# Patient Record
Sex: Female | Born: 1960 | Race: White | Hispanic: No | Marital: Single | Smoking: Never smoker
Health system: Southern US, Community
[De-identification: ages and names within clinical notes are randomized; demographics above are authoritative.]

## PROBLEM LIST (undated history)

## (undated) DIAGNOSIS — E039 Hypothyroidism, unspecified: Secondary | ICD-10-CM

## (undated) DIAGNOSIS — K219 Gastro-esophageal reflux disease without esophagitis: Secondary | ICD-10-CM

## (undated) DIAGNOSIS — M1990 Unspecified osteoarthritis, unspecified site: Secondary | ICD-10-CM

## (undated) DIAGNOSIS — I10 Essential (primary) hypertension: Secondary | ICD-10-CM

## (undated) DIAGNOSIS — F419 Anxiety disorder, unspecified: Secondary | ICD-10-CM

## (undated) HISTORY — DX: Anxiety disorder, unspecified: F41.9

## (undated) HISTORY — PX: TONSILLECTOMY: SUR1361

## (undated) HISTORY — DX: Essential (primary) hypertension: I10

## (undated) HISTORY — DX: Hypothyroidism, unspecified: E03.9

---

## 1998-07-28 ENCOUNTER — Other Ambulatory Visit: Admission: RE | Admit: 1998-07-28 | Discharge: 1998-07-28 | Payer: Self-pay | Admitting: *Deleted

## 2001-01-19 ENCOUNTER — Other Ambulatory Visit: Admission: RE | Admit: 2001-01-19 | Discharge: 2001-01-19 | Payer: Self-pay | Admitting: *Deleted

## 2002-09-11 ENCOUNTER — Emergency Department (HOSPITAL_COMMUNITY): Admission: EM | Admit: 2002-09-11 | Discharge: 2002-09-11 | Payer: Self-pay | Admitting: Emergency Medicine

## 2002-09-11 ENCOUNTER — Encounter: Payer: Self-pay | Admitting: Emergency Medicine

## 2002-09-12 ENCOUNTER — Emergency Department (HOSPITAL_COMMUNITY): Admission: EM | Admit: 2002-09-12 | Discharge: 2002-09-12 | Payer: Self-pay | Admitting: Emergency Medicine

## 2004-05-21 ENCOUNTER — Emergency Department (HOSPITAL_COMMUNITY): Admission: EM | Admit: 2004-05-21 | Discharge: 2004-05-21 | Payer: Self-pay | Admitting: Family Medicine

## 2004-07-04 ENCOUNTER — Emergency Department (HOSPITAL_COMMUNITY): Admission: EM | Admit: 2004-07-04 | Discharge: 2004-07-04 | Payer: Self-pay | Admitting: Family Medicine

## 2005-10-23 ENCOUNTER — Encounter: Admission: RE | Admit: 2005-10-23 | Discharge: 2005-10-23 | Payer: Self-pay | Admitting: Family Medicine

## 2005-12-06 ENCOUNTER — Other Ambulatory Visit: Admission: RE | Admit: 2005-12-06 | Discharge: 2005-12-06 | Payer: Self-pay | Admitting: Obstetrics & Gynecology

## 2007-04-28 ENCOUNTER — Other Ambulatory Visit: Admission: RE | Admit: 2007-04-28 | Discharge: 2007-04-28 | Payer: Self-pay | Admitting: Obstetrics & Gynecology

## 2010-06-24 ENCOUNTER — Encounter: Payer: Self-pay | Admitting: Family Medicine

## 2011-01-22 ENCOUNTER — Emergency Department (HOSPITAL_COMMUNITY)
Admission: EM | Admit: 2011-01-22 | Discharge: 2011-01-22 | Disposition: A | Discharge status: Home / Self Care | Payer: 59 | Attending: Emergency Medicine | Admitting: Emergency Medicine

## 2011-01-22 DIAGNOSIS — R42 Dizziness and giddiness: Secondary | ICD-10-CM | POA: Insufficient documentation

## 2011-01-22 DIAGNOSIS — I10 Essential (primary) hypertension: Secondary | ICD-10-CM | POA: Insufficient documentation

## 2011-01-22 DIAGNOSIS — K219 Gastro-esophageal reflux disease without esophagitis: Secondary | ICD-10-CM | POA: Insufficient documentation

## 2011-01-22 DIAGNOSIS — R51 Headache: Secondary | ICD-10-CM | POA: Insufficient documentation

## 2011-01-22 LAB — POCT I-STAT, CHEM 8
Calcium, Ion: 1.25 mmol/L (ref 1.12–1.32)
Creatinine, Ser: 0.7 mg/dL (ref 0.50–1.10)
Glucose, Bld: 96 mg/dL (ref 70–99)
HCT: 41 % (ref 36.0–46.0)
Hemoglobin: 13.9 g/dL (ref 12.0–15.0)
Potassium: 3.7 mEq/L (ref 3.5–5.1)
TCO2: 28 mmol/L (ref 0–100)

## 2011-01-28 ENCOUNTER — Other Ambulatory Visit: Payer: Self-pay | Admitting: Internal Medicine

## 2011-01-28 DIAGNOSIS — R110 Nausea: Secondary | ICD-10-CM

## 2011-01-29 ENCOUNTER — Ambulatory Visit
Admission: RE | Admit: 2011-01-29 | Discharge: 2011-01-29 | Disposition: A | Discharge status: Home / Self Care | Payer: 59 | Source: Ambulatory Visit | Attending: Internal Medicine | Admitting: Internal Medicine

## 2011-01-29 DIAGNOSIS — R110 Nausea: Secondary | ICD-10-CM

## 2011-08-14 ENCOUNTER — Other Ambulatory Visit: Payer: Self-pay | Admitting: Internal Medicine

## 2011-08-14 DIAGNOSIS — D1803 Hemangioma of intra-abdominal structures: Secondary | ICD-10-CM

## 2011-09-05 ENCOUNTER — Other Ambulatory Visit: Payer: 59

## 2013-02-22 ENCOUNTER — Encounter: Payer: Self-pay | Admitting: Internal Medicine

## 2013-08-25 ENCOUNTER — Encounter: Payer: Self-pay | Admitting: Gynecology

## 2013-08-25 ENCOUNTER — Ambulatory Visit (INDEPENDENT_AMBULATORY_CARE_PROVIDER_SITE_OTHER): Payer: 59 | Admitting: Gynecology

## 2013-08-25 VITALS — BP 120/80 | HR 70 | Resp 16 | Ht 62.25 in | Wt 150.0 lb

## 2013-08-25 DIAGNOSIS — Z0000 Encounter for general adult medical examination without abnormal findings: Secondary | ICD-10-CM

## 2013-08-25 DIAGNOSIS — F419 Anxiety disorder, unspecified: Secondary | ICD-10-CM | POA: Insufficient documentation

## 2013-08-25 DIAGNOSIS — E039 Hypothyroidism, unspecified: Secondary | ICD-10-CM | POA: Insufficient documentation

## 2013-08-25 DIAGNOSIS — Z01419 Encounter for gynecological examination (general) (routine) without abnormal findings: Secondary | ICD-10-CM

## 2013-08-25 DIAGNOSIS — Z124 Encounter for screening for malignant neoplasm of cervix: Secondary | ICD-10-CM

## 2013-08-25 DIAGNOSIS — I10 Essential (primary) hypertension: Secondary | ICD-10-CM | POA: Insufficient documentation

## 2013-08-25 LAB — POCT URINE PREGNANCY: Preg Test, Ur: NEGATIVE

## 2013-08-25 NOTE — Progress Notes (Signed)
53 y.o. Single Caucasian female  G0 here for annual exam. Pt reports menses are absent. regular.  She does report hot flashes, does have night sweats, does not have vaginal dryness.  She is not using lubricants.  Pt reports cycles were heavy and regular and dysmenorrhea, but had not seen Gyn for over 8y.  No treatment for hot flashes or night sweats.  Not sexually active for several years.  Patient's last menstrual period was 10/30/2012.          Sexually active: no  The current method of family planning is post menopausal status.    Exercising: yes  yoga 2x/wk ; walking the dogs qd, mow the grass q0d Last pap: 2008 WNL Abnormal PAP: no Mammogram: 10/31/2005  BSE: yes  Colonoscopy: 07/2013 Normal  F/u in 5 years  DEXA: 1998 Normal   Alcohol: 7 drinks/wk Tobacco: no  Labs: Thressa Sheller, MD   No health maintenance topics applied.  Family History  Problem Relation Age of Onset  . Uterine cancer Maternal Grandmother   . Colon cancer      Paternal Event organiser  . Breast cancer      Maternal Great Grandmother  . Hypertension Father   . Hypertension Brother   . Heart attack Father   . Lupus Mother   . Osteoporosis Mother     Patient Active Problem List   Diagnosis Date Noted  . Hypothyroid   . Hypertension   . Anxiety     Past Medical History  Diagnosis Date  . Anxiety   . Hypertension   . Hypothyroid     Past Surgical History  Procedure Laterality Date  . Tonsillectomy  3rd grade    Allergies: Review of patient's allergies indicates no known allergies.  Current Outpatient Prescriptions  Medication Sig Dispense Refill  . esomeprazole (NEXIUM) 40 MG capsule Take 40 mg by mouth daily at 12 noon.      Marland Kitchen FLUoxetine (PROZAC) 40 MG capsule Take 40 mg by mouth daily.      . Levothyroxine Sodium (SYNTHROID PO) Take by mouth.      . losartan (COZAAR) 25 MG tablet Take 25 mg by mouth daily.       No current facility-administered medications for this visit.    ROS:  Pertinent items are noted in HPI.  Exam:    BP 120/80  Pulse 70  Resp 16  Ht 5' 2.25" (1.581 m)  Wt 150 lb (68.04 kg)  BMI 27.22 kg/m2  LMP 10/30/2012 Weight change: @WEIGHTCHANGE @ Last 3 height recordings:  Ht Readings from Last 3 Encounters:  08/25/13 5' 2.25" (1.581 m)   General appearance: alert, cooperative and appears stated age Head: Normocephalic, without obvious abnormality, atraumatic Neck: no adenopathy, no carotid bruit, no JVD, supple, symmetrical, trachea midline and thyroid not enlarged, symmetric, no tenderness/mass/nodules Lungs: clear to auscultation bilaterally Breasts: normal appearance, no masses or tenderness Heart: regular rate and rhythm, S1, S2 normal, no murmur, click, rub or gallop Abdomen: soft, non-tender; bowel sounds normal; no masses,  no organomegaly Extremities: extremities normal, atraumatic, no cyanosis or edema Skin: Skin color, texture, turgor normal. No rashes or lesions Lymph nodes: Cervical, supraclavicular, and axillary nodes normal. no inguinal nodes palpated Neurologic: Grossly normal   Pelvic: External genitalia:  no lesions              Urethra: normal appearing urethra with no masses, tenderness or lesions              Bartholins  and Skenes: normal                 Vagina: normal appearing vagina with normal color and discharge, no lesions              Cervix: normal appearance              Pap taken: yes        Bimanual Exam:  Uterus:  uterus is normal size, shape, consistency and nontender                                      Adnexa:    normal adnexa in size, nontender and no masses                                      Rectovaginal: Confirms                                      Anus:  normal sphincter tone, no lesions  A: well woman Perimenopausal with symptoms     P: mammogram pap smear with HRHPV, guidelines reviewed Informed to call with bleeding once no cycle for over 1y Discussed treatment options for menopausal  symptoms: HRT, effexor, behavioral modifications. Pt has been on prozac since father died in 13-Sep-1994 and is hesitant to change, had tried effexor in past counseled on breast self exam, mammography screening, menopause, adequate intake of calcium and vitamin D, diet and exercise return annually or prn Discussed PAP guideline changes, importance of weight bearing exercises, calcium, vit D and balanced diet.  An After Visit Summary was printed and given to the patient.

## 2013-08-25 NOTE — Patient Instructions (Addendum)

## 2013-08-30 LAB — IPS PAP TEST WITH HPV

## 2013-08-31 ENCOUNTER — Telehealth: Payer: Self-pay | Admitting: *Deleted

## 2013-08-31 MED ORDER — TINIDAZOLE 500 MG PO TABS: ORAL_TABLET | ORAL | Status: DC

## 2013-08-31 NOTE — Telephone Encounter (Signed)
Message copied by Alfonzo Feller on Tue Aug 31, 2013  1:26 PM ------      Message from: Elveria Rising      Created: Mon Aug 30, 2013  4:56 PM       Inform BV on PAP, can treat if desires, either metrogel at hs for 5 nights or tindamax 500mg x4 repeat #8, recall 2 ------

## 2013-08-31 NOTE — Telephone Encounter (Signed)
Patient is calling jasmine back °

## 2013-08-31 NOTE — Telephone Encounter (Signed)
Patient notified would like to treat BV with Tindamax 500 mg daily x 4 days and then repeat.  Tindamax 500 mg #8/0 refills sent in to [facility] , patient is aware.

## 2013-08-31 NOTE — Telephone Encounter (Signed)
Message copied by Alfonzo Feller on Tue Aug 31, 2013  9:07 AM ------      Message from: Elveria Rising      Created: Mon Aug 30, 2013  4:56 PM       Inform BV on PAP, can treat if desires, either metrogel at hs for 5 nights or tindamax 500mg x4 repeat #8, recall 2 ------

## 2013-08-31 NOTE — Telephone Encounter (Signed)
Left Message To Call Back  

## 2014-05-03 ENCOUNTER — Telehealth: Payer: Self-pay

## 2014-05-03 NOTE — Telephone Encounter (Signed)
AEX has been schedule with Ms. Sue [name] on 09/02/14

## 2014-06-24 ENCOUNTER — Institutional Professional Consult (permissible substitution): Payer: 59 | Admitting: Sports Medicine

## 2014-06-30 ENCOUNTER — Ambulatory Visit (INDEPENDENT_AMBULATORY_CARE_PROVIDER_SITE_OTHER): Payer: 59 | Admitting: Sports Medicine

## 2014-06-30 ENCOUNTER — Encounter: Payer: Self-pay | Admitting: Sports Medicine

## 2014-06-30 VITALS — BP 141/96 | HR 88 | Ht 63.0 in | Wt 154.0 lb

## 2014-06-30 DIAGNOSIS — L723 Sebaceous cyst: Secondary | ICD-10-CM | POA: Insufficient documentation

## 2014-06-30 MED ORDER — HYDROCODONE-ACETAMINOPHEN 5-325 MG PO TABS: 1.0000 | ORAL_TABLET | Freq: Three times a day (TID) | ORAL | Status: DC | PRN

## 2014-06-30 NOTE — Assessment & Plan Note (Signed)
Surgical excision of cyst and capsule today. Return next week for a wound check. Hydrocodone for pain.

## 2014-06-30 NOTE — Progress Notes (Signed)
   Subjective:    I'm seeing this patient as a consultation for:  Dr. Thressa Sheller  CC: Skin mass  HPI: This is a very pleasant 54 year old female, for sometime now she's had a mass on the midline of her back between the shoulder blades, it is occasionally painful, very irritating, and occasionally drains. She was referred to me for further evaluation and possible surgical treatment. Symptoms are moderate, persistent. Pain does not radiate.  Past medical history, Surgical history, Family history not pertinant except as noted below, Social history, Allergies, and medications have been entered into the medical record, reviewed, and no changes needed.   Review of Systems: No headache, visual changes, nausea, vomiting, diarrhea, constipation, dizziness, abdominal pain, skin rash, fevers, chills, night sweats, weight loss, swollen lymph nodes, body aches, joint swelling, muscle aches, chest pain, shortness of breath, mood changes, visual or auditory hallucinations.   Objective:   General: Well Developed, well nourished, and in no acute distress.  Neuro/Psych: Alert and oriented x3, extra-ocular muscles intact, able to move all 4 extremities, sensation grossly intact. Skin: Warm and dry, no rashes noted. There is a 2.5 cm well-defined, movable mass in the midline between her shoulder blades, there is an additional mass approximately 1.5 cm somewhat cranial. Respiratory: Not using accessory muscles, speaking in full sentences, trachea midline.  Cardiovascular: Pulses palpable, no extremity edema. Abdomen: Does not appear distended.  Procedure:  Excision of 2.5cm midline back sebaceous cyst Risks, benefits, and alternatives explained and consent obtained. Time out conducted. Surface prepped with alcohol. 5cc lidocaine with epinephine infiltrated in a field block. Adequate anesthesia ensured. Area prepped and draped in a sterile fashion. Using a #15 blade I made a longitudinal incision over  the mass, using both sharp and blunt dissection I carried the dissection around each side of the mass which clearly appeared to be a sebaceous cyst. The cyst was very adherent to the surrounding subcutaneous tissues. At one point I did have to empty the cyst contents, I was then able to remove the cyst capsule en-bloc. I then placed a single 4-0 Prolene horizontal mattress suture approximating the edges well. Hemostasis achieved. Estimated blood loss is minimal. Pt stable.  Impression and Recommendations:   This case required medical decision making of moderate complexity.

## 2014-06-30 NOTE — Patient Instructions (Signed)

## 2014-07-08 ENCOUNTER — Ambulatory Visit (INDEPENDENT_AMBULATORY_CARE_PROVIDER_SITE_OTHER): Payer: 59 | Admitting: Sports Medicine

## 2014-07-08 ENCOUNTER — Encounter: Payer: Self-pay | Admitting: Sports Medicine

## 2014-07-08 DIAGNOSIS — L723 Sebaceous cyst: Secondary | ICD-10-CM

## 2014-07-08 MED ORDER — DOXYCYCLINE HYCLATE 100 MG PO TABS: 100.0000 mg | ORAL_TABLET | Freq: Two times a day (BID) | ORAL | Status: DC

## 2014-07-08 NOTE — Progress Notes (Signed)
  Subjective: 1 week post surgical excision of a sebaceous cyst on the back, doing well. There is a second sebaceous cyst somewhat above that has become painful.  Objective: General: Well-developed, well-nourished, and in no acute distress. Back: Incision is clean, dry, intact, a single horizontal mattress Prolene suture was removed, wound looks good and was dressed. There is a single sebaceous cyst just superior, it is bigger than prior. Also slightly tender to palpation.  Assessment/plan:

## 2014-07-08 NOTE — Assessment & Plan Note (Signed)
Doing well now post surgical excision, a single horizontal mattress suture was removed. It looks like the single sebaceous cyst above has become infected, we are going to do a course of doxycycline for 7 days, I can see her back in about 2 weeks, or whenever she desires for excision of the second sebaceous cyst.

## 2014-07-15 ENCOUNTER — Encounter: Payer: Self-pay | Admitting: Sports Medicine

## 2014-07-15 ENCOUNTER — Ambulatory Visit (INDEPENDENT_AMBULATORY_CARE_PROVIDER_SITE_OTHER): Payer: 59 | Admitting: Sports Medicine

## 2014-07-15 DIAGNOSIS — L723 Sebaceous cyst: Secondary | ICD-10-CM

## 2014-07-15 MED ORDER — HYDROCODONE-ACETAMINOPHEN 10-325 MG PO TABS: 1.0000 | ORAL_TABLET | Freq: Three times a day (TID) | ORAL | Status: DC | PRN

## 2014-07-15 NOTE — Assessment & Plan Note (Signed)
Removal of sebaceous cyst #2. Return in 7 days for suture removal.

## 2014-07-15 NOTE — Progress Notes (Signed)
   Procedure:  Excision of 2.5cm midline back sebaceous cyst Risks, benefits, and alternatives explained and consent obtained. Time out conducted. Surface prepped with alcohol. 5cc lidocaine with epinephine infiltrated in a field block. Adequate anesthesia ensured. Area prepped and draped in a sterile fashion. Using a #15 blade I made a longitudinal incision over the mass, using both sharp and blunt dissection I carried the dissection around each side of the mass which clearly appeared to be a sebaceous cyst. The cyst was very adherent to the surrounding subcutaneous tissues. At one point I did have to empty the cyst contents, I was then able to remove the cyst capsule en-bloc. I then placed a single 4-0 Prolene horizontal mattress suture approximating the edges well. Hemostasis achieved. Estimated blood loss is minimal. Pt stable.

## 2014-07-22 ENCOUNTER — Ambulatory Visit (INDEPENDENT_AMBULATORY_CARE_PROVIDER_SITE_OTHER): Payer: 59 | Admitting: Sports Medicine

## 2014-07-22 ENCOUNTER — Encounter: Payer: Self-pay | Admitting: Sports Medicine

## 2014-07-22 ENCOUNTER — Ambulatory Visit (INDEPENDENT_AMBULATORY_CARE_PROVIDER_SITE_OTHER): Payer: 59

## 2014-07-22 VITALS — BP 149/96 | HR 75 | Wt 153.0 lb

## 2014-07-22 DIAGNOSIS — J209 Acute bronchitis, unspecified: Secondary | ICD-10-CM

## 2014-07-22 DIAGNOSIS — L723 Sebaceous cyst: Secondary | ICD-10-CM

## 2014-07-22 DIAGNOSIS — R0989 Other specified symptoms and signs involving the circulatory and respiratory systems: Secondary | ICD-10-CM

## 2014-07-22 DIAGNOSIS — R05 Cough: Secondary | ICD-10-CM

## 2014-07-22 MED ORDER — AZITHROMYCIN 250 MG PO TABS: ORAL_TABLET | ORAL | Status: DC

## 2014-07-22 MED ORDER — PREDNISONE 50 MG PO TABS: 50.0000 mg | ORAL_TABLET | Freq: Every day | ORAL | Status: DC

## 2014-07-22 MED ORDER — BENZONATATE 200 MG PO CAPS: 200.0000 mg | ORAL_CAPSULE | Freq: Three times a day (TID) | ORAL | Status: DC | PRN

## 2014-07-22 NOTE — Assessment & Plan Note (Signed)
Doing extremely well post excision, sutures removed today, return as needed for this.

## 2014-07-22 NOTE — Progress Notes (Signed)
  Subjective:    CC: suture removal, cough  HPI: Sebaceous cyst: Last week remove the sebaceous cyst, this was #2, from her back. She is here for suture removal, doing well, no pain, no local symptoms.  Cough: Present for a couple of weeks, has failed a course of doxycycline, significant wheezing, mild shortness of breath. No constitutional symptoms.  Past medical history, Surgical history, Family history not pertinant except as noted below, Social history, Allergies, and medications have been entered into the medical record, reviewed, and no changes needed.   Review of Systems: No fevers, chills, night sweats, weight loss, chest pain, or shortness of breath.   Objective:    General: Well Developed, well nourished, and in no acute distress.  Neuro: Alert and oriented x3, extra-ocular muscles intact, sensation grossly intact.  HEENT: Normocephalic, atraumatic, pupils equal round reactive to light, neck supple, no masses, no lymphadenopathy, thyroid nonpalpable.  Skin: Warm and dry, no rashes. Cardiac: Regular rate and rhythm, no murmurs rubs or gallops, no lower extremity edema.  Respiratory: diffuse expiratory wheezes with coarse sounds, and rales. Not using accessory muscles, speaking in full sentences.  Incision is clean, dry, and intact, suture removed.  Impression and Recommendations:

## 2014-07-22 NOTE — Assessment & Plan Note (Signed)
Unfortunately with persistent symptoms despite a course of doxycycline. There is significant expiratory wheezes, and coarse sounds. Azithromycin, prednisone, Tessalon Perles, chest x-ray. She can follow up with PCP regarding this in one to 2 weeks.

## 2014-07-29 ENCOUNTER — Ambulatory Visit: Payer: 59 | Admitting: Sports Medicine

## 2014-09-02 ENCOUNTER — Ambulatory Visit: Payer: Self-pay | Admitting: Certified Nurse Midwife

## 2014-09-02 ENCOUNTER — Ambulatory Visit: Payer: Self-pay | Admitting: Gynecology

## 2014-09-06 ENCOUNTER — Other Ambulatory Visit: Payer: Self-pay | Admitting: Family Medicine

## 2014-09-06 ENCOUNTER — Other Ambulatory Visit: Payer: Self-pay | Admitting: Internal Medicine

## 2014-09-06 DIAGNOSIS — N63 Unspecified lump in unspecified breast: Secondary | ICD-10-CM

## 2014-09-09 ENCOUNTER — Ambulatory Visit
Admission: RE | Admit: 2014-09-09 | Discharge: 2014-09-09 | Disposition: A | Discharge status: Home / Self Care | Payer: 59 | Source: Ambulatory Visit | Attending: Internal Medicine | Admitting: Internal Medicine

## 2014-09-09 DIAGNOSIS — N63 Unspecified lump in unspecified breast: Secondary | ICD-10-CM

## 2014-09-09 LAB — HM MAMMOGRAPHY

## 2014-11-03 ENCOUNTER — Encounter: Payer: Self-pay | Admitting: Certified Nurse Midwife

## 2014-11-03 ENCOUNTER — Ambulatory Visit (INDEPENDENT_AMBULATORY_CARE_PROVIDER_SITE_OTHER): Payer: 59 | Admitting: Certified Nurse Midwife

## 2014-11-03 VITALS — BP 110/70 | HR 68 | Resp 16 | Ht 62.5 in | Wt 153.0 lb

## 2014-11-03 DIAGNOSIS — Z01419 Encounter for gynecological examination (general) (routine) without abnormal findings: Secondary | ICD-10-CM | POA: Diagnosis not present

## 2014-11-03 NOTE — Progress Notes (Signed)
54 y.o. G0P0000 Single  Caucasian Fe here for annual exam. Menopausal no HRT. Denies vaginal bleeding or vaginal dryness. No STD screening. Sees Dr. Aline August yearly, and sees for Hypertension/hypothyroid management. Busy with her 3 rescue dogs! No health issues today.  Patient's last menstrual period was 06/03/2012.          Sexually active:No The current method of family planning is none.    Exercising: Yes.    yoga Smoker:  no  Health Maintenance: Pap:  08-25-13 neg HPV HR neg, no abnormals MMG: 09-09-14 category c density,birads 2 & rt breast u/s, 3 D Mammogram yearly Colonoscopy:  2015 neg. 5 year, family history of colon ca BMD:   1998 TDaP:  2011 Labs: none Self breast exam: done monthly   reports that she has never smoked. She does not have any smokeless tobacco history on file. She reports that she drinks about 3.5 oz of alcohol per week. She reports that she does not use illicit drugs.  Past Medical History  Diagnosis Date  . Hypertension   . Hypothyroid   . Anxiety     Past Surgical History  Procedure Laterality Date  . Tonsillectomy  3rd grade    Current Outpatient Prescriptions  Medication Sig Dispense Refill  . esomeprazole (NEXIUM) 40 MG capsule Take 40 mg by mouth daily at 12 noon.    Marland Kitchen FLUoxetine (PROZAC) 40 MG capsule Take 40 mg by mouth daily.    Marland Kitchen levothyroxine (SYNTHROID, LEVOTHROID) 100 MCG tablet Take 100 mcg by mouth daily before breakfast.    . losartan (COZAAR) 25 MG tablet Take 25 mg by mouth daily.     No current facility-administered medications for this visit.    Family History  Problem Relation Age of Onset  . Uterine cancer Maternal Grandmother   . Colon cancer      Paternal Event organiser  . Breast cancer      Maternal Great Grandmother  . Hypertension Father   . Hypertension Brother   . Heart attack Father   . Lupus Mother   . Osteoporosis Mother     ROS:  Pertinent items are noted in HPI.  Otherwise, a comprehensive ROS was  negative.  Exam:   BP 110/70 mmHg  Pulse 68  Resp 16  Ht 5' 2.5" (1.588 m)  Wt 153 lb (69.4 kg)  BMI 27.52 kg/m2  LMP 06/03/2012 Height: 5' 2.5" (158.8 cm) Ht Readings from Last 3 Encounters:  11/03/14 5' 2.5" (1.588 m)  07/08/14 5\' 3"  (1.6 m)  06/30/14 5\' 3"  (1.6 m)    General appearance: alert, cooperative and appears stated age Head: Normocephalic, without obvious abnormality, atraumatic Neck: no adenopathy, supple, symmetrical, trachea midline and thyroid normal to inspection and palpation Lungs: clear to auscultation bilaterally Breasts: normal appearance, no masses or tenderness, No nipple retraction or dimpling, No nipple discharge or bleeding, No axillary or supraclavicular adenopathy Heart: regular rate and rhythm Abdomen: soft, non-tender; no masses,  no organomegaly Extremities: extremities normal, atraumatic, no cyanosis or edema Skin: Skin color, texture, turgor normal. No rashes or lesions Lymph nodes: Cervical, supraclavicular, and axillary nodes normal. No abnormal inguinal nodes palpated Neurologic: Grossly normal   Pelvic: External genitalia:  no lesions              Urethra:  normal appearing urethra with no masses, tenderness or lesions              Bartholin's and Skene's: normal  Vagina: normal appearing vagina with normal color and discharge, no lesions              Cervix: normal,non tender, no lesions              Pap taken: Yes.   Bimanual Exam:  Uterus:  normal size, contour, position, consistency, mobility, non-tender              Adnexa: normal adnexa and no mass, fullness, tenderness               Rectovaginal: Confirms, slight decrease in pigment noted in perianal area, no lace like pattern, no scaling or exudate               Anus:  normal sphincter tone, no lesions  Chaperone present: Yes  A:  Well Woman with normal exam  Menopausal no HRT  Hypertension/Hypothyroid stable medication with PCP management.  Decrease  pigmentation perianal area ? LS vs irritation from spanx undergarment  P:   Reviewed health and wellness pertinent to exam  Aware of need to advise if vaginal bleeding  Pap smear as above.  Continue with MD follow up as indicated  Discussed findings with patient, she feels may be from spanx use( very snug) Instructed to apply coconut oil to area to protect and at vaginal opening slightly dry and would like to recheck in 2 weeks. If has itching needs to advise.   counseled on breast self exam, mammography screening, adequate intake of calcium and vitamin D, diet and exercise  return annually or prn  An After Visit Summary was printed and given to the patient.

## 2014-11-03 NOTE — Patient Instructions (Signed)

## 2014-11-04 NOTE — Progress Notes (Signed)
Reviewed personally.  M. Suzanne Archie Shea, MD.  

## 2014-11-24 ENCOUNTER — Encounter: Payer: Self-pay | Admitting: *Deleted

## 2014-11-30 ENCOUNTER — Telehealth: Payer: Self-pay | Admitting: Certified Nurse Midwife

## 2014-11-30 NOTE — Telephone Encounter (Signed)
Patient cancelled her 2 week recheck. She is doing better.

## 2014-12-02 ENCOUNTER — Ambulatory Visit: Payer: 59 | Admitting: Certified Nurse Midwife

## 2015-03-30 ENCOUNTER — Other Ambulatory Visit: Payer: Self-pay | Admitting: Sports Medicine

## 2015-04-10 ENCOUNTER — Other Ambulatory Visit: Payer: Self-pay | Admitting: Sports Medicine

## 2015-06-07 MED FILL — XIFAXAN 550 MG TABLET: 550 | 14 days supply | Qty: 42 | Fill #1

## 2015-06-16 MED FILL — NexIUM 40 MG CPDR: 40 | 90 days supply | Qty: 180 | Fill #0

## 2015-06-29 MED FILL — XIFAXAN 550 MG TABLET: 550 | 14 days supply | Qty: 42 | Fill #2

## 2015-07-12 MED FILL — clonazePAM 1 MG TABS: 1 | 30 days supply | Qty: 30 | Fill #0

## 2015-07-25 MED FILL — XIFAXAN 550 MG TABLET: 550 | 14 days supply | Qty: 42 | Fill #3

## 2015-08-10 MED FILL — clonazePAM 1 MG TABS: 1 | 30 days supply | Qty: 30 | Fill #1

## 2015-08-11 MED FILL — LEVOTHYROXINE 25 MCG TABLET: 25 | 90 days supply | Qty: 90 | Fill #0

## 2015-08-11 MED FILL — LOSARTAN-HCTZ 100-12.5 MG T: 100-12.5 | 90 days supply | Qty: 90 | Fill #0

## 2015-08-23 MED FILL — FLUoxetine HCL 40 MG CAPS: 40 | 90 days supply | Qty: 90 | Fill #0

## 2015-08-25 DIAGNOSIS — K219 Gastro-esophageal reflux disease without esophagitis: Secondary | ICD-10-CM | POA: Diagnosis not present

## 2015-08-25 DIAGNOSIS — I10 Essential (primary) hypertension: Secondary | ICD-10-CM | POA: Diagnosis not present

## 2015-09-08 MED FILL — clonazePAM 1 MG TABS: 1 | 30 days supply | Qty: 30 | Fill #2

## 2015-09-11 DIAGNOSIS — R197 Diarrhea, unspecified: Secondary | ICD-10-CM | POA: Diagnosis not present

## 2015-09-11 DIAGNOSIS — Z0001 Encounter for general adult medical examination with abnormal findings: Secondary | ICD-10-CM | POA: Diagnosis not present

## 2015-09-11 DIAGNOSIS — I10 Essential (primary) hypertension: Secondary | ICD-10-CM | POA: Diagnosis not present

## 2015-09-11 DIAGNOSIS — Z0183 Encounter for blood typing: Secondary | ICD-10-CM | POA: Diagnosis not present

## 2015-09-26 MED FILL — NexIUM 40 MG CPDR: 40 | 90 days supply | Qty: 180 | Fill #0

## 2015-09-29 DIAGNOSIS — F419 Anxiety disorder, unspecified: Secondary | ICD-10-CM | POA: Diagnosis not present

## 2015-09-29 DIAGNOSIS — K219 Gastro-esophageal reflux disease without esophagitis: Secondary | ICD-10-CM | POA: Diagnosis not present

## 2015-09-29 DIAGNOSIS — F339 Major depressive disorder, recurrent, unspecified: Secondary | ICD-10-CM | POA: Diagnosis not present

## 2015-09-29 DIAGNOSIS — Z0001 Encounter for general adult medical examination with abnormal findings: Secondary | ICD-10-CM | POA: Diagnosis not present

## 2015-09-29 DIAGNOSIS — E663 Overweight: Secondary | ICD-10-CM | POA: Diagnosis not present

## 2015-09-29 DIAGNOSIS — E039 Hypothyroidism, unspecified: Secondary | ICD-10-CM | POA: Diagnosis not present

## 2015-09-29 DIAGNOSIS — I10 Essential (primary) hypertension: Secondary | ICD-10-CM | POA: Diagnosis not present

## 2015-10-16 MED FILL — clonazePAM 1 MG TABS: 1 | 30 days supply | Qty: 30 | Fill #0

## 2015-11-10 MED FILL — LEVOTHYROXINE 25 MCG TABLET: 25 | 90 days supply | Qty: 90 | Fill #1

## 2015-11-10 MED FILL — LOSARTAN-HCTZ 100-12.5 MG T: 100-12.5 | 90 days supply | Qty: 90 | Fill #1

## 2015-11-28 MED FILL — clonazePAM 1 MG TABS: 1 | 30 days supply | Qty: 30 | Fill #1

## 2015-11-28 MED FILL — FLUoxetine HCL 40 MG CAPS: 40 | 90 days supply | Qty: 90 | Fill #1

## 2015-12-29 MED FILL — NexIUM 40 MG CPDR: 40 | 90 days supply | Qty: 180 | Fill #1

## 2016-01-01 MED FILL — clonazePAM 1 MG TABS: 1 | 30 days supply | Qty: 30 | Fill #2

## 2016-01-31 ENCOUNTER — Ambulatory Visit (INDEPENDENT_AMBULATORY_CARE_PROVIDER_SITE_OTHER): Payer: 59

## 2016-01-31 ENCOUNTER — Ambulatory Visit (INDEPENDENT_AMBULATORY_CARE_PROVIDER_SITE_OTHER): Payer: 59 | Admitting: Sports Medicine

## 2016-01-31 DIAGNOSIS — S2231XA Fracture of one rib, right side, initial encounter for closed fracture: Secondary | ICD-10-CM | POA: Diagnosis not present

## 2016-01-31 DIAGNOSIS — W11XXXA Fall on and from ladder, initial encounter: Secondary | ICD-10-CM

## 2016-01-31 DIAGNOSIS — S298XXA Other specified injuries of thorax, initial encounter: Secondary | ICD-10-CM

## 2016-01-31 DIAGNOSIS — I700 Atherosclerosis of aorta: Secondary | ICD-10-CM

## 2016-01-31 DIAGNOSIS — S299XXA Unspecified injury of thorax, initial encounter: Secondary | ICD-10-CM

## 2016-01-31 MED ORDER — KETOROLAC TROMETHAMINE 30 MG/ML IJ SOLN
30.0000 mg | Freq: Once | INTRAMUSCULAR | Status: AC
  Administered 2016-01-31: 30 mg via INTRAMUSCULAR

## 2016-01-31 MED ORDER — HYDROCODONE-ACETAMINOPHEN 5-325 MG PO TABS: 1.0000 | ORAL_TABLET | Freq: Three times a day (TID) | ORAL | 0 refills | Status: DC | PRN

## 2016-01-31 MED FILL — HYDROCODON-APAP 5-325: 5-325 | 10 days supply | Qty: 30 | Fill #0

## 2016-01-31 NOTE — Assessment & Plan Note (Signed)
Severe pain, right anterior lower ribs. X-rays. Rib belt. Toradol 30 mg intramuscular Hydrocodone for pain.

## 2016-01-31 NOTE — Progress Notes (Signed)
   Subjective:    I'm seeing this patient as a consultation for:  Dr. Thressa Sheller  CC: Right chest wall pain  HPI: This is a pleasant 55 year old female, she fell on her right chest yesterday and has severe pain that she localizes along the right anterior chest wall. Severe, persistent without radiation. No shortness of breath.  Past medical history, Surgical history, Family history not pertinant except as noted below, Social history, Allergies, and medications have been entered into the medical record, reviewed, and no changes needed.   Review of Systems: No headache, visual changes, nausea, vomiting, diarrhea, constipation, dizziness, abdominal pain, skin rash, fevers, chills, night sweats, weight loss, swollen lymph nodes, body aches, joint swelling, muscle aches, chest pain, shortness of breath, mood changes, visual or auditory hallucinations.   Objective:   General: Well Developed, well nourished, and in no acute distress.  Neuro/Psych: Alert and oriented x3, extra-ocular muscles intact, able to move all 4 extremities, sensation grossly intact. Skin: Warm and dry, no rashes noted.  Respiratory: Not using accessory muscles, speaking in full sentences, trachea midline.  Cardiovascular: Pulses palpable, no extremity edema. Abdomen: Does not appear distended. Chest wall: Severe tenderness along the costal margin as well as the anterior seventh through 10th ribs without palpable crepitus or deformity.  X-rays do show 2 rib fractures on the right side.  Chest was strapped with compressive dressing  Impression and Recommendations:   This case required medical decision making of moderate complexity.  Chest wall trauma Severe pain, right anterior lower ribs. X-rays. Rib belt. Toradol 30 mg intramuscular Hydrocodone for pain.

## 2016-02-01 ENCOUNTER — Encounter: Payer: Self-pay | Admitting: Sports Medicine

## 2016-02-01 ENCOUNTER — Ambulatory Visit (INDEPENDENT_AMBULATORY_CARE_PROVIDER_SITE_OTHER): Payer: 59 | Admitting: Sports Medicine

## 2016-02-01 DIAGNOSIS — S2231XD Fracture of one rib, right side, subsequent encounter for fracture with routine healing: Secondary | ICD-10-CM

## 2016-02-01 DIAGNOSIS — S298XXA Other specified injuries of thorax, initial encounter: Secondary | ICD-10-CM

## 2016-02-01 DIAGNOSIS — S299XXA Unspecified injury of thorax, initial encounter: Secondary | ICD-10-CM

## 2016-02-01 MED ORDER — KETOROLAC TROMETHAMINE 30 MG/ML IJ SOLN
30.0000 mg | Freq: Once | INTRAMUSCULAR | Status: AC
  Administered 2016-02-01: 30 mg via INTRAMUSCULAR

## 2016-02-01 NOTE — Assessment & Plan Note (Addendum)
Continue current pain medication but increase to every 4 hours. Continue rib binder. No signs or symptoms of pneumothorax. Toradol 30 intramuscular

## 2016-02-01 NOTE — Progress Notes (Signed)
  Subjective:    CC: Persistent pain  HPI: This is a pleasant 55 year old female with acute rib fractures on the right side. Unfortunately her pain is not well-controlled, she is using the rib binder, but has really not been diligent with her narcotics.  Past medical history, Surgical history, Family history not pertinant except as noted below, Social history, Allergies, and medications have been entered into the medical record, reviewed, and no changes needed.   Review of Systems: No fevers, chills, night sweats, weight loss, chest pain, or shortness of breath.   Objective:    General: Well Developed, well nourished, and in no acute distress.  Neuro: Alert and oriented x3, extra-ocular muscles intact, sensation grossly intact.  HEENT: Normocephalic, atraumatic, pupils equal round reactive to light, neck supple, no masses, no lymphadenopathy, thyroid nonpalpable.  Skin: Warm and dry, no rashes. Cardiac: Regular rate and rhythm, no murmurs rubs or gallops, no lower extremity edema.  Respiratory: Clear to auscultation bilaterally. Not using accessory muscles, speaking in full sentences.Still with exquisite tenderness to palpation  Impression and Recommendations:    Fracture of rib of right side Continue current pain medication but increase to every 4 hours. Continue rib binder. No signs or symptoms of pneumothorax. Toradol 30 intramuscular

## 2016-02-01 NOTE — Addendum Note (Signed)
Addended by: Elizabeth Sauer on: 02/01/2016 03:31 PM   Modules accepted: Orders

## 2016-02-02 ENCOUNTER — Telehealth: Payer: Self-pay

## 2016-02-02 MED ORDER — OXYCODONE-ACETAMINOPHEN 10-325 MG PO TABS: 1.0000 | ORAL_TABLET | ORAL | 0 refills | Status: DC | PRN

## 2016-02-02 MED FILL — OXYCODONE-APAP 10-325 TAB: 10-325 | 7 days supply | Qty: 40 | Fill #0

## 2016-02-02 NOTE — Telephone Encounter (Signed)
Sue [name] called and states Dr Dianah Field increased the hydrocodone to 2 every 4 hours. If she continues to take as advised her will run out by Sunday. She would like a refill on the Hydrocodone. She still has a lot of rib pain from the fracture. Please advise.

## 2016-02-02 NOTE — Telephone Encounter (Signed)
I'm going to increase to Percocet tens, take 1/2-1 tab every 4 hours. She should pick up the prescription today.

## 2016-02-02 NOTE — Telephone Encounter (Signed)
Patient advised.

## 2016-02-09 ENCOUNTER — Ambulatory Visit: Payer: 59 | Admitting: Sports Medicine

## 2016-02-12 ENCOUNTER — Telehealth: Payer: Self-pay

## 2016-02-12 MED ORDER — OXYCODONE-ACETAMINOPHEN 10-325 MG PO TABS: 1.0000 | ORAL_TABLET | ORAL | 0 refills | Status: DC | PRN

## 2016-02-12 MED FILL — OXYCODONE-APAP 10-325 TAB: 10-325 | 7 days supply | Qty: 40 | Fill #0

## 2016-02-12 NOTE — Telephone Encounter (Signed)
Pt.notified

## 2016-02-12 NOTE — Telephone Encounter (Signed)
Pt called requesting a refill on oxcy.  Is this refill appropriate?

## 2016-02-12 NOTE — Telephone Encounter (Signed)
Refilled

## 2016-02-14 ENCOUNTER — Ambulatory Visit: Payer: 59 | Admitting: Sports Medicine

## 2016-02-14 MED FILL — clonazePAM 1 MG TABS: 1 | 30 days supply | Qty: 30 | Fill #0

## 2016-02-14 MED FILL — LEVOTHYROXINE 25 MCG TABLET: 25 | 90 days supply | Qty: 90 | Fill #0

## 2016-02-14 MED FILL — LOSARTAN-HCTZ 100-12.5 MG T: 100-12.5 | 90 days supply | Qty: 90 | Fill #0

## 2016-02-16 ENCOUNTER — Ambulatory Visit (INDEPENDENT_AMBULATORY_CARE_PROVIDER_SITE_OTHER): Payer: 59 | Admitting: Sports Medicine

## 2016-02-16 DIAGNOSIS — S2231XD Fracture of one rib, right side, subsequent encounter for fracture with routine healing: Secondary | ICD-10-CM

## 2016-02-16 MED ORDER — HYDROMORPHONE HCL 2 MG PO TABS: ORAL_TABLET | ORAL | 0 refills | Status: DC

## 2016-02-16 MED FILL — HYDROmorphone HCL 2 MG TABS: 2 | 5 days supply | Qty: 40 | Fill #0

## 2016-02-16 NOTE — Assessment & Plan Note (Addendum)
2 weeks post multiple rib fractures on the right. Doing much, much better. Having some itching with Percocet.  Switching to hydromorphone.  Continue rib binder. She is still walking her 4 dogs, I have encouraged her to hire a dog walker for the next few weeks. Return to see me in 4 weeks.

## 2016-02-16 NOTE — Progress Notes (Signed)
  Subjective:    CC: Follow-up  HPI: Right-sided rib fractures: Improving significantly, she is getting a bit of itching from her oxycodone.  Past medical history, Surgical history, Family history not pertinant except as noted below, Social history, Allergies, and medications have been entered into the medical record, reviewed, and no changes needed.   Review of Systems: No fevers, chills, night sweats, weight loss, chest pain, or shortness of breath.   Objective:    General: Well Developed, well nourished, and in no acute distress.  Neuro: Alert and oriented x3, extra-ocular muscles intact, sensation grossly intact.  HEENT: Normocephalic, atraumatic, pupils equal round reactive to light, neck supple, no masses, no lymphadenopathy, thyroid nonpalpable.  Skin: Warm and dry, no rashes. Cardiac: Regular rate and rhythm, no murmurs rubs or gallops, no lower extremity edema.  Respiratory: Clear to auscultation bilaterally. Not using accessory muscles, speaking in full sentences.  Impression and Recommendations:    Fracture of rib of right side 2 weeks post multiple rib fractures on the right. Doing much, much better. Having some itching with Percocet.  Switching to hydromorphone.  Continue rib binder. She is still walking her 4 dogs, I have encouraged her to hire a dog walker for the next few weeks. Return to see me in 4 weeks.

## 2016-02-19 ENCOUNTER — Ambulatory Visit: Payer: 59 | Admitting: Sports Medicine

## 2016-02-22 ENCOUNTER — Other Ambulatory Visit: Payer: Self-pay | Admitting: Sports Medicine

## 2016-02-22 DIAGNOSIS — L723 Sebaceous cyst: Secondary | ICD-10-CM

## 2016-02-23 ENCOUNTER — Emergency Department (HOSPITAL_COMMUNITY)
Admission: EM | Admit: 2016-02-23 | Discharge: 2016-02-24 | Disposition: A | Discharge status: Home / Self Care | Payer: 59 | Attending: Emergency Medicine | Admitting: Emergency Medicine

## 2016-02-23 ENCOUNTER — Encounter (HOSPITAL_COMMUNITY): Payer: Self-pay | Admitting: Oncology

## 2016-02-23 DIAGNOSIS — Y9389 Activity, other specified: Secondary | ICD-10-CM | POA: Insufficient documentation

## 2016-02-23 DIAGNOSIS — R079 Chest pain, unspecified: Secondary | ICD-10-CM | POA: Diagnosis not present

## 2016-02-23 DIAGNOSIS — Y929 Unspecified place or not applicable: Secondary | ICD-10-CM | POA: Diagnosis not present

## 2016-02-23 DIAGNOSIS — E039 Hypothyroidism, unspecified: Secondary | ICD-10-CM | POA: Diagnosis not present

## 2016-02-23 DIAGNOSIS — S2241XA Multiple fractures of ribs, right side, initial encounter for closed fracture: Secondary | ICD-10-CM | POA: Insufficient documentation

## 2016-02-23 DIAGNOSIS — S299XXA Unspecified injury of thorax, initial encounter: Secondary | ICD-10-CM | POA: Diagnosis present

## 2016-02-23 DIAGNOSIS — W19XXXA Unspecified fall, initial encounter: Secondary | ICD-10-CM | POA: Diagnosis not present

## 2016-02-23 DIAGNOSIS — I10 Essential (primary) hypertension: Secondary | ICD-10-CM | POA: Diagnosis not present

## 2016-02-23 DIAGNOSIS — Z79899 Other long term (current) drug therapy: Secondary | ICD-10-CM | POA: Diagnosis not present

## 2016-02-23 DIAGNOSIS — S2231XS Fracture of one rib, right side, sequela: Secondary | ICD-10-CM | POA: Diagnosis not present

## 2016-02-23 DIAGNOSIS — Y999 Unspecified external cause status: Secondary | ICD-10-CM | POA: Diagnosis not present

## 2016-02-23 NOTE — ED Provider Notes (Signed)
[place] [facility] DEPT Provider Note   CSN: [id number] Arrival date & time: 02/23/16  2108 By signing my name below, I, Dyke Brackett, attest that this documentation has been prepared under the direction and in the presence of non-physician practitioner, Dewaine Oats PA-C Electronically Signed: Dyke Brackett, Scribe. 02/23/2016. 11:59 PM.   History   Chief Complaint Chief Complaint  Patient presents with  . Rib Pain    HPI Sue [name] is a 55 y.o. female who presents to the Emergency Department complaining of severe right rib pain which began three weeks ago s/p fall. Pt was hanging blinds on her porch when she fell, breaking her right 9th rib. She was initially given hydrocodone which she has taken with no relief, and then oxycodone which provided some relief, but made her itch. She was then prescribed dilaudid which also provided no relief of pain. Pt has started taking percocet with benadryl to combat itching, but has run out.  She states her 25 lb dog jumped on her right ribs yesterday, exacerbating the pain. She describes her pain as sharp and stabbing. No alleviating factors noted. Pt denies SOB, cough, fever.   The history is provided by the patient. No language interpreter was used.   Past Medical History:  Diagnosis Date  . Anxiety   . Hypertension   . Hypothyroid     Patient Active Problem List   Diagnosis Date Noted  . Fracture of rib of right side 01/31/2016  . Acute bronchitis 07/22/2014  . Sebaceous cyst 06/30/2014  . Hypothyroid   . Hypertension   . Anxiety     Past Surgical History:  Procedure Laterality Date  . TONSILLECTOMY  3rd grade    OB History    Gravida Para Term Preterm AB Living   0 0 0 0 0 0   SAB TAB Ectopic Multiple Live Births   0 0 0 0        Home Medications    Prior to Admission medications   Medication Sig Start Date End Date Taking? Authorizing Provider  esomeprazole (NEXIUM) 40 MG capsule Take 40 mg by mouth daily at 12  noon.    Historical Provider, MD  HYDROmorphone (DILAUDID) 2 MG tablet 1-2 tabs PO q6h prn pain. 02/16/16   Silverio Decamp, MD  levothyroxine (SYNTHROID, LEVOTHROID) 100 MCG tablet Take 100 mcg by mouth daily before breakfast.    Historical Provider, MD  losartan (COZAAR) 25 MG tablet Take 25 mg by mouth daily.    Historical Provider, MD  Venlafaxine HCl (EFFEXOR PO) Take by mouth daily.    Historical Provider, MD    Family History Family History  Problem Relation Age of Onset  . Uterine cancer Maternal Grandmother   . Colon cancer      Paternal Event organiser  . Breast cancer      Maternal Great Grandmother  . Hypertension Father   . Heart attack Father   . Hypertension Brother   . Lupus Mother   . Osteoporosis Mother     Social History Social History  Substance Use Topics  . Smoking status: Never Smoker  . Smokeless tobacco: Never Used  . Alcohol use 3.5 oz/week    7 Standard drinks or equivalent per week     Allergies   Oxycodone   Review of Systems Review of Systems  Constitutional: Negative for chills and fever.  HENT: Negative.   Respiratory: Negative.  Negative for shortness of breath.   Cardiovascular: Positive for chest pain.  Gastrointestinal: Negative.   Musculoskeletal: Negative.   Skin: Negative.   Neurological: Negative.     Physical Exam Updated Vital Signs BP (!) 147/112 (BP Location: Left Arm)   Pulse 80   Temp 98.1 F (36.7 C) (Oral)   Resp 20   Ht 5\' 2"  (1.575 m)   Wt 158 lb (71.7 kg)   LMP 06/03/2012   SpO2 97%   BMI 28.90 kg/m   Physical Exam  Constitutional: She is oriented to person, place, and time. She appears well-developed and well-nourished. No distress.  HENT:  Head: Normocephalic and atraumatic.  Eyes: Conjunctivae are normal.  Cardiovascular: Normal rate.   Pulmonary/Chest: Effort normal. She exhibits tenderness.  Full and clear breath sounds throughout  exquisitely tender right anterolateral chest wall .     Abdominal: She exhibits no distension. There is no tenderness.  Abdomen is completely non tender, specifically no RUQ tenderness  Neurological: She is alert and oriented to person, place, and time.  Skin: Skin is warm and dry.  Psychiatric: She has a normal mood and affect.  Nursing note and vitals reviewed.  ED Treatments / Results  DIAGNOSTIC STUDIES:  Oxygen Saturation is 97% on RA, normal by my interpretation.    COORDINATION OF CARE:  11:56 PM Discussed treatment plan with pt at bedside and pt agreed to plan.  Labs (all labs ordered are listed, but only abnormal results are displayed) Labs Reviewed - No data to display  EKG  EKG Interpretation None       Radiology No results found. Dg Chest 2 View  Result Date: 02/24/2016 CLINICAL DATA:  Right-sided, lower anterior rib pain after a fall 3 weeks ago. Known fracture of the ninth rib. History of hypertension. EXAM: CHEST  2 VIEW COMPARISON:  01/31/2016 FINDINGS: Normal heart size and pulmonary vascularity. No focal airspace disease or consolidation in the lungs. No blunting of costophrenic angles. No pneumothorax. Mediastinal contours appear intact. IMPRESSION: No active cardiopulmonary disease. Electronically Signed   By: Lucienne Capers M.D.   On: 02/24/2016 00:57   Dg Ribs Unilateral Right  Result Date: 02/24/2016 CLINICAL DATA:  Right lower anterior rib pain after a fall 3 weeks ago. Known ninth rib fracture EXAM: RIGHT RIBS - 2 VIEW COMPARISON:  01/31/2016 FINDINGS: Mildly displaced fracture of the right anterior ninth rib with similar appearance to previous study. Additional nondisplaced fracture of the right anterolateral tenth rib is visualized. No destructive bone lesions. IMPRESSION: Fractures of the right anterior ninth and tenth ribs. Electronically Signed   By: Lucienne Capers M.D.   On: 02/24/2016 00:59   Dg Ribs Unilateral W/chest Right  Result Date: 01/31/2016 CLINICAL DATA:  Status post fall off of ladder  last night with a right chest injury. Rib pain. Initial encounter. EXAM: RIGHT RIBS AND CHEST - 3+ VIEW COMPARISON:  PA and lateral chest 07/22/2014. FINDINGS: The lungs are clear. No pneumothorax or pleural effusion. Heart size is normal. Aortic atherosclerosis is noted. Acute slightly displaced fracture of the anterior arc of the right ninth rib is identified. No other fracture is seen. IMPRESSION: Acute mildly displaced fracture anterior right ninth rib. Negative for pneumothorax or acute cardiopulmonary disease. Atherosclerosis. Electronically Signed   By: Inge Rise M.D.   On: 01/31/2016 11:57    Procedures Procedures (including critical care time)  Medications Ordered in ED Medications - No data to display   Initial Impression / Assessment and Plan / ED Course  I have reviewed the triage vital signs and the  nursing notes.  Pertinent labs & imaging results that were available during my care of the patient were reviewed by me and considered in my medical decision making (see chart for details).  Clinical Course    Patient with known right rib fracture presents with persistent pain that is no better after 3 weeks. She reports her dog jumped on her chest causing worsening pain. No SOB, cough or fever.   Imaging tonight shows fractures of 9th and 10th ribs. Nondisplaced. No hypoxia or compromised breathing. Encouraged pain control with ibuprofen and close PCP follow up for pain management.   Final Clinical Impressions(s) / ED Diagnoses   Final diagnoses:  None  1. Right 9th and 10th rib fractures  DIAGNOSTIC STUDIES:  Oxygen Saturation is 97% on RA, normal by my interpretation.    COORDINATION OF CARE:  11:04 PM Discussed treatment plan with pt at bedside and pt agreed to plan.   New Prescriptions New Prescriptions   No medications on file     Charlann Lange, PA-C 02/28/16 0355    Everlene Balls, MD 02/28/16 0401

## 2016-02-23 NOTE — ED Triage Notes (Signed)
Pt states she broke right 9th rib x 3 weeks ago while hanging blinds on her front porch.  Pt has seen a provider for this and was initially given hydrocodone which did not relieve the pain.  Pt then given oxycodone however it made her itch so she stopped taking it.  Pt was then rx dilaudid.  Pt ran out of the dilaudid and took the remainder of oxycodone d/t the severity of the pain.  Pt presents tonight d/t uncontrolled pain.

## 2016-02-24 ENCOUNTER — Emergency Department (HOSPITAL_COMMUNITY): Payer: 59

## 2016-02-24 DIAGNOSIS — S2241XA Multiple fractures of ribs, right side, initial encounter for closed fracture: Secondary | ICD-10-CM | POA: Diagnosis not present

## 2016-02-24 DIAGNOSIS — Z79899 Other long term (current) drug therapy: Secondary | ICD-10-CM | POA: Diagnosis not present

## 2016-02-24 DIAGNOSIS — E039 Hypothyroidism, unspecified: Secondary | ICD-10-CM | POA: Diagnosis not present

## 2016-02-24 DIAGNOSIS — R0781 Pleurodynia: Secondary | ICD-10-CM | POA: Diagnosis not present

## 2016-02-24 DIAGNOSIS — I10 Essential (primary) hypertension: Secondary | ICD-10-CM | POA: Diagnosis not present

## 2016-02-24 MED ORDER — OXYCODONE-ACETAMINOPHEN 5-325 MG PO TABS
2.0000 | ORAL_TABLET | Freq: Once | ORAL | Status: AC
  Administered 2016-02-24: 2 via ORAL
  Filled 2016-02-24: qty 2

## 2016-02-24 MED ORDER — DIPHENHYDRAMINE HCL 25 MG PO CAPS
25.0000 mg | ORAL_CAPSULE | Freq: Once | ORAL | Status: AC
Start: 2016-02-24 — End: 2016-02-24
  Administered 2016-02-24: 25 mg via ORAL
  Filled 2016-02-24: qty 1

## 2016-02-24 MED ORDER — IBUPROFEN 800 MG PO TABS
800.0000 mg | ORAL_TABLET | Freq: Once | ORAL | Status: AC
  Administered 2016-02-24: 800 mg via ORAL
  Filled 2016-02-24: qty 1

## 2016-02-24 MED ORDER — OXYCODONE-ACETAMINOPHEN 10-325 MG PO TABS: 1.0000 | ORAL_TABLET | ORAL | 0 refills | Status: DC | PRN

## 2016-02-24 MED ORDER — IBUPROFEN 600 MG PO TABS: 600.0000 mg | ORAL_TABLET | Freq: Four times a day (QID) | ORAL | 0 refills | Status: DC | PRN

## 2016-02-26 ENCOUNTER — Telehealth: Payer: Self-pay

## 2016-02-26 NOTE — Telephone Encounter (Signed)
How much does she have left?

## 2016-02-26 NOTE — Telephone Encounter (Signed)
Previously hydromorphone was controlling her pain, I am happy to prescribe this again at higher dose, but at some point we do need to start tapering her down, she also needs to be more careful with regards to her dogs.

## 2016-02-26 NOTE — Telephone Encounter (Signed)
Pt states the hydromorphone isn't doing anything and would like to go back on the percocet. Please advise.

## 2016-02-26 NOTE — Telephone Encounter (Signed)
Pt left VM stating she had problems controlling her pain this weekend and had to go to the ER and would like to know what next step she should take. Please advise.

## 2016-02-27 ENCOUNTER — Other Ambulatory Visit: Payer: Self-pay | Admitting: Sports Medicine

## 2016-02-27 DIAGNOSIS — L723 Sebaceous cyst: Secondary | ICD-10-CM

## 2016-02-27 MED ORDER — OXYCODONE-ACETAMINOPHEN 10-325 MG PO TABS: 1.0000 | ORAL_TABLET | ORAL | 0 refills | Status: DC | PRN

## 2016-02-27 MED FILL — DOXYCYCLINE HYCLATE 100 MG: 100 | 7 days supply | Qty: 14 | Fill #0

## 2016-02-27 MED FILL — OXYCODONE-APAP 10-325 TAB: 10-325 | 5 days supply | Qty: 30 | Fill #0

## 2016-02-27 NOTE — Telephone Encounter (Signed)
Pt advised, she will come pick up Rx today.

## 2016-02-27 NOTE — Telephone Encounter (Signed)
Pt should have around 20 pill of the hydromorphone left. Pt states she only took it for 1 day, then went back to her Percocet. When she ran our of Percocet she went back to hydromorphone and took another days supply. Will route.

## 2016-03-07 MED FILL — FLUoxetine HCL 40 MG CAPS: 40 | 90 days supply | Qty: 90 | Fill #0

## 2016-03-14 ENCOUNTER — Ambulatory Visit: Payer: 59 | Admitting: Sports Medicine

## 2016-03-15 ENCOUNTER — Ambulatory Visit: Payer: 59 | Admitting: Sports Medicine

## 2016-03-21 MED FILL — clonazePAM 1 MG TABS: 1 | 30 days supply | Qty: 30 | Fill #1

## 2016-03-22 ENCOUNTER — Encounter: Payer: Self-pay | Admitting: Sports Medicine

## 2016-03-22 ENCOUNTER — Ambulatory Visit (INDEPENDENT_AMBULATORY_CARE_PROVIDER_SITE_OTHER): Payer: 59 | Admitting: Sports Medicine

## 2016-03-22 DIAGNOSIS — I10 Essential (primary) hypertension: Secondary | ICD-10-CM | POA: Diagnosis not present

## 2016-03-22 DIAGNOSIS — S2241XD Multiple fractures of ribs, right side, subsequent encounter for fracture with routine healing: Secondary | ICD-10-CM | POA: Diagnosis not present

## 2016-03-22 NOTE — Assessment & Plan Note (Signed)
7 weeks post multiple right-sided rib fractures, overall doing well. Nearly pain-free, off of all pain medications. Return as needed.

## 2016-03-22 NOTE — Progress Notes (Signed)
  Subjective:    CC: Follow-up  HPI: Sue [name] is now 7 weeks post multiple right-sided rib fractures, we had some difficulty initially controlling her pain but overall she's doing well now.  Past medical history:  Negative.  See flowsheet/record as well for more information.  Surgical history: Negative.  See flowsheet/record as well for more information.  Family history: Negative.  See flowsheet/record as well for more information.  Social history: Negative.  See flowsheet/record as well for more information.  Allergies, and medications have been entered into the medical record, reviewed, and no changes needed.   Review of Systems: No fevers, chills, night sweats, weight loss, chest pain, or shortness of breath.   Objective:    General: Well Developed, well nourished, and in no acute distress.  Neuro: Alert and oriented x3, extra-ocular muscles intact, sensation grossly intact.  HEENT: Normocephalic, atraumatic, pupils equal round reactive to light, neck supple, no masses, no lymphadenopathy, thyroid nonpalpable.  Skin: Warm and dry, no rashes. Cardiac: Regular rate and rhythm, no murmurs rubs or gallops, no lower extremity edema.  Respiratory: Clear to auscultation bilaterally. Not using accessory muscles, speaking in full sentences.  Impression and Recommendations:    Fracture of rib of right side 7 weeks post multiple right-sided rib fractures, overall doing well. Nearly pain-free, off of all pain medications. Return as needed.

## 2016-03-28 DIAGNOSIS — J40 Bronchitis, not specified as acute or chronic: Secondary | ICD-10-CM | POA: Diagnosis not present

## 2016-03-28 DIAGNOSIS — J040 Acute laryngitis: Secondary | ICD-10-CM | POA: Diagnosis not present

## 2016-03-28 DIAGNOSIS — H6692 Otitis media, unspecified, left ear: Secondary | ICD-10-CM | POA: Diagnosis not present

## 2016-03-28 MED FILL — BENZONATATE 100 MG CAPSULE: 100 | 10 days supply | Qty: 30 | Fill #0

## 2016-03-28 MED FILL — AZITHROMYCIN 250 MG TABLET: 250 | 5 days supply | Qty: 6 | Fill #0

## 2016-03-28 MED FILL — CHERATUSSIN AC SYRUP: 100-10 | 3 days supply | Qty: 120 | Fill #0

## 2016-04-17 MED FILL — NexIUM 40 MG CPDR: 40 | 90 days supply | Qty: 180 | Fill #0

## 2016-04-24 MED FILL — clonazePAM 1 MG TABS: 1 | 30 days supply | Qty: 30 | Fill #2

## 2016-04-29 MED FILL — BENZONATATE 100 MG CAPSULE: 100 | 10 days supply | Qty: 30 | Fill #0

## 2016-05-20 MED FILL — LOSARTAN-HCTZ 100-12.5 MG T: 100-12.5 | 90 days supply | Qty: 90 | Fill #1

## 2016-05-20 MED FILL — LEVOTHYROXINE 25 MCG TABLET: 25 | 90 days supply | Qty: 90 | Fill #1

## 2016-05-24 ENCOUNTER — Ambulatory Visit (HOSPITAL_COMMUNITY)
Admission: EM | Admit: 2016-05-24 | Discharge: 2016-05-24 | Disposition: A | Discharge status: Home / Self Care | Payer: 59 | Attending: Family Medicine | Admitting: Family Medicine

## 2016-05-24 ENCOUNTER — Encounter (HOSPITAL_COMMUNITY): Payer: Self-pay | Admitting: Emergency Medicine

## 2016-05-24 DIAGNOSIS — B001 Herpesviral vesicular dermatitis: Secondary | ICD-10-CM | POA: Diagnosis not present

## 2016-05-24 MED ORDER — VALACYCLOVIR HCL 500 MG PO TABS: 500.0000 mg | ORAL_TABLET | Freq: Two times a day (BID) | ORAL | 2 refills | Status: DC

## 2016-05-24 MED FILL — VALACYCLOVIR HCL 500 MG TAB: 500 | 3 days supply | Qty: 6 | Fill #0

## 2016-05-24 NOTE — ED Triage Notes (Signed)
Pt has recurrent shingles rashes. PT reports this rash was first noticed yesterday morning. Blistering rash under left nostril.

## 2016-05-24 NOTE — ED Provider Notes (Signed)
[place]    CSN: [id number] Arrival date & time: 05/24/16  1001     History   Chief Complaint Chief Complaint  Patient presents with  . Rash    HPI Sue NEUNER is a 55 y.o. female.   The history is provided by the patient.  Rash  Location:  Face Facial rash location:  Nose Quality: blistering and painful   Pain details:    Quality:  Burning   Severity:  Mild   Duration:  1 day   Progression:  Worsening Progression:  Unchanged Chronicity:  Recurrent Relieved by:  None tried Worsened by:  Nothing Ineffective treatments:  None tried Associated symptoms: no fever     Past Medical History:  Diagnosis Date  . Anxiety   . Hypertension   . Hypothyroid     Patient Active Problem List   Diagnosis Date Noted  . Fracture of rib of right side 01/31/2016  . Acute bronchitis 07/22/2014  . Sebaceous cyst 06/30/2014  . Hypothyroid   . Hypertension   . Anxiety     Past Surgical History:  Procedure Laterality Date  . TONSILLECTOMY  3rd grade    OB History    Gravida Para Term Preterm AB Living   0 0 0 0 0 0   SAB TAB Ectopic Multiple Live Births   0 0 0 0         Home Medications    Prior to Admission medications   Medication Sig Start Date End Date Taking? Authorizing Provider  esomeprazole (NEXIUM) 40 MG capsule Take 40 mg by mouth daily at 12 noon.    Historical Provider, MD  ibuprofen (ADVIL,MOTRIN) 600 MG tablet Take 1 tablet (600 mg total) by mouth every 6 (six) hours as needed. 02/24/16   Charlann Lange, PA-C  levothyroxine (SYNTHROID, LEVOTHROID) 100 MCG tablet Take 100 mcg by mouth daily before breakfast.    Historical Provider, MD  losartan (COZAAR) 25 MG tablet Take 25 mg by mouth daily.    Historical Provider, MD  Venlafaxine HCl (EFFEXOR PO) Take by mouth daily.    Historical Provider, MD    Family History Family History  Problem Relation Age of Onset  . Uterine cancer Maternal Grandmother   . Colon cancer      Paternal  Event organiser  . Breast cancer      Maternal Great Grandmother  . Hypertension Father   . Heart attack Father   . Hypertension Brother   . Lupus Mother   . Osteoporosis Mother     Social History Social History  Substance Use Topics  . Smoking status: Never Smoker  . Smokeless tobacco: Never Used  . Alcohol use 3.5 oz/week    7 Standard drinks or equivalent per week     Comment: 5 per week     Allergies   Oxycodone   Review of Systems Review of Systems  Constitutional: Negative.  Negative for fever.  HENT: Negative.   Skin: Positive for rash.  All other systems reviewed and are negative.    Physical Exam Triage Vital Signs ED Triage Vitals  Enc Vitals Group     BP 05/24/16 1020 133/96     Pulse Rate 05/24/16 1020 79     Resp 05/24/16 1020 16     Temp 05/24/16 1020 98.6 F (37 C)     Temp Source 05/24/16 1020 Oral     SpO2 05/24/16 1020 100 %     Weight 05/24/16 1022 150  lb (68 kg)     Height 05/24/16 1022 5\' 2"  (1.575 m)     Head Circumference --      Peak Flow --      Pain Score 05/24/16 1023 4     Pain Loc --      Pain Edu? --      Excl. in [place]? --    No data found.   Updated Vital Signs BP 133/96   Pulse 79   Temp 98.6 F (37 C) (Oral)   Resp 16   Ht 5\' 2"  (1.575 m)   Wt 150 lb (68 kg)   LMP 06/03/2012   SpO2 100%   BMI 27.44 kg/m   Visual Acuity Right Eye Distance:   Left Eye Distance:   Bilateral Distance:    Right Eye Near:   Left Eye Near:    Bilateral Near:     Physical Exam  Constitutional: She appears well-developed and well-nourished.  HENT:  Right Ear: External ear normal.  Left Ear: External ear normal.  Mouth/Throat: Oropharynx is clear and moist.  Neck: Normal range of motion.  Skin: Skin is warm and dry. Rash noted.  Grouped vesicular rash left nares.  No infection, tender l neck node.  Nursing note and vitals reviewed.    UC Treatments / Results  Labs (all labs ordered are listed, but only abnormal  results are displayed) Labs Reviewed - No data to display  EKG  EKG Interpretation None       Radiology No results found.  Procedures Procedures (including critical care time)  Medications Ordered in UC Medications - No data to display   Initial Impression / Assessment and Plan / UC Course  I have reviewed the triage vital signs and the nursing notes.  Pertinent labs & imaging results that were available during my care of the patient were reviewed by me and considered in my medical decision making (see chart for details).  Clinical Course       Final Clinical Impressions(s) / UC Diagnoses   Final diagnoses:  None    New Prescriptions New Prescriptions   No medications on file     Billy Fischer, MD 05/30/16 2050

## 2016-06-05 MED FILL — clonazePAM 1 MG TABS: 1 | 30 days supply | Qty: 30 | Fill #0

## 2016-06-12 MED FILL — FLUoxetine HCL 40 MG CAPS: 40 | 90 days supply | Qty: 90 | Fill #1

## 2016-07-12 MED FILL — clonazePAM 1 MG TABS: 1 | 30 days supply | Qty: 30 | Fill #0

## 2016-07-22 DIAGNOSIS — Z79899 Other long term (current) drug therapy: Secondary | ICD-10-CM | POA: Diagnosis not present

## 2016-07-29 DIAGNOSIS — E039 Hypothyroidism, unspecified: Secondary | ICD-10-CM | POA: Diagnosis not present

## 2016-07-29 DIAGNOSIS — I10 Essential (primary) hypertension: Secondary | ICD-10-CM | POA: Diagnosis not present

## 2016-07-29 DIAGNOSIS — F329 Major depressive disorder, single episode, unspecified: Secondary | ICD-10-CM | POA: Diagnosis not present

## 2016-07-29 DIAGNOSIS — K219 Gastro-esophageal reflux disease without esophagitis: Secondary | ICD-10-CM | POA: Diagnosis not present

## 2016-08-09 MED FILL — clonazePAM 1 MG TABS: 1 | 30 days supply | Qty: 30 | Fill #0

## 2016-08-12 MED FILL — ESOMEPRAZOLE MAG DR 40 MG C: 40 | 30 days supply | Qty: 60 | Fill #0

## 2016-08-15 ENCOUNTER — Ambulatory Visit: Payer: 59 | Admitting: Sports Medicine

## 2016-08-16 ENCOUNTER — Ambulatory Visit (INDEPENDENT_AMBULATORY_CARE_PROVIDER_SITE_OTHER): Payer: 59 | Admitting: Sports Medicine

## 2016-08-16 DIAGNOSIS — M25512 Pain in left shoulder: Secondary | ICD-10-CM | POA: Insufficient documentation

## 2016-08-16 MED ORDER — MELOXICAM 15 MG PO TABS: ORAL_TABLET | ORAL | 3 refills | Status: DC

## 2016-08-16 MED FILL — MELOXICAM 15 MG TABLET: 15 | 30 days supply | Qty: 30 | Fill #0

## 2016-08-16 NOTE — Assessment & Plan Note (Signed)
Strain at the long head of the biceps/partial tear. Biceps tendon sheath injection, meloxicam. Biceps rehabilitation exercises given. Return in one month.

## 2016-08-16 NOTE — Progress Notes (Signed)
   Subjective:    I'm seeing this patient as a consultation for:  Dr. Thressa Sheller  CC: Left shoulder pain  HPI: 2 weeks ago this 56 year old female was walking her dog, her left arm which he jerked into flexion, since then she's had pain that she localizes at the anterior joint line, worse with flexion of the shoulder. Pain is severe, persistent. Does not radiate.  Past medical history:  Negative.  See flowsheet/record as well for more information.  Surgical history: Negative.  See flowsheet/record as well for more information.  Family history: Negative.  See flowsheet/record as well for more information.  Social history: Negative.  See flowsheet/record as well for more information.  Allergies, and medications have been entered into the medical record, reviewed, and no changes needed.   Review of Systems: No headache, visual changes, nausea, vomiting, diarrhea, constipation, dizziness, abdominal pain, skin rash, fevers, chills, night sweats, weight loss, swollen lymph nodes, body aches, joint swelling, muscle aches, chest pain, shortness of breath, mood changes, visual or auditory hallucinations.   Objective:   General: Well Developed, well nourished, and in no acute distress.  Neuro/Psych: Alert and oriented x3, extra-ocular muscles intact, able to move all 4 extremities, sensation grossly intact. Skin: Warm and dry, no rashes noted.  Respiratory: Not using accessory muscles, speaking in full sentences, trachea midline.  Cardiovascular: Pulses palpable, no extremity edema. Abdomen: Does not appear distended. Left Shoulder: Inspection reveals no abnormalities, atrophy or asymmetry. Palpation is normal with no tenderness over AC joint or bicipital groove. ROM is full in all planes. Rotator cuff strength normal throughout. No signs of impingement with negative Neer and Hawkin's tests, empty can. Speeds test strongly positive No labral pathology noted with negative Obrien's, negative  crank, negative clunk, and good stability. Normal scapular function observed. No painful arc and no drop arm sign. No apprehension sign  Procedure: Real-time Ultrasound Guided Injection of left biceps tendon sheath Device: GE Logiq E  Verbal informed consent obtained.  Time-out conducted.  Noted no overlying erythema, induration, or other signs of local infection.  Skin prepped in a sterile fashion.  Local anesthesia: Topical Ethyl chloride.  With sterile technique and under real time ultrasound guidance:  Noted intact long head of the biceps well seated in the bicipital groove, 25-gauge needle advanced just under the biceps in the groove and 1 mL kenalog 40, 1 mL lidocaine, 1 mL bupivacaine injected easily. Completed without difficulty  Pain immediately resolved suggesting accurate placement of the medication.  Advised to call if fevers/chills, erythema, induration, drainage, or persistent bleeding.  Images permanently stored and available for review in the ultrasound unit.  Impression: Technically successful ultrasound guided injection.  Impression and Recommendations:   This case required medical decision making of moderate complexity.  Left shoulder pain Strain at the long head of the biceps/partial tear. Biceps tendon sheath injection, meloxicam. Biceps rehabilitation exercises given. Return in one month.

## 2016-08-21 MED FILL — LEVOTHYROXINE 25 MCG TABLET: 25 | 90 days supply | Qty: 90 | Fill #0

## 2016-08-21 MED FILL — LOSARTAN-HCTZ 100-12.5 MG T: 100-12.5 | 90 days supply | Qty: 90 | Fill #0

## 2016-09-05 MED FILL — ESOMEPRAZOLE MAG DR 40 MG C: 40 | 30 days supply | Qty: 60 | Fill #1

## 2016-09-13 ENCOUNTER — Ambulatory Visit: Payer: 59 | Admitting: Sports Medicine

## 2016-09-23 MED FILL — FLUoxetine HCL 40 MG CAPS: 40 | 90 days supply | Qty: 90 | Fill #0

## 2016-10-07 MED FILL — ESOMEPRAZOLE MAG DR 40 MG C: 40 | 30 days supply | Qty: 60 | Fill #2

## 2016-10-07 MED FILL — clonazePAM 1 MG TABS: 1 | 30 days supply | Qty: 30 | Fill #0

## 2016-11-08 MED FILL — clonazePAM 1 MG TABS: 1 | 30 days supply | Qty: 30 | Fill #0

## 2016-11-15 MED FILL — PANTOPRAZOLE SOD DR 40 MG T: 40 | 30 days supply | Qty: 30 | Fill #0

## 2016-11-21 MED FILL — DEXILANT DR 60 MG CAPSULE: 60 | 90 days supply | Qty: 90 | Fill #0

## 2016-11-26 MED FILL — LOSARTAN-HCTZ 100-12.5 MG T: 100-12.5 | 90 days supply | Qty: 90 | Fill #1

## 2016-11-26 MED FILL — LEVOTHYROXINE 25 MCG TABLET: 25 | 90 days supply | Qty: 90 | Fill #1

## 2016-12-17 MED FILL — clonazePAM 1 MG TABS: 1 | 30 days supply | Qty: 30 | Fill #0

## 2016-12-24 MED FILL — FLUoxetine HCL 40 MG CAPS: 40 | 90 days supply | Qty: 90 | Fill #1

## 2017-01-21 DIAGNOSIS — Z0000 Encounter for general adult medical examination without abnormal findings: Secondary | ICD-10-CM | POA: Diagnosis not present

## 2017-01-21 DIAGNOSIS — E7800 Pure hypercholesterolemia, unspecified: Secondary | ICD-10-CM | POA: Diagnosis not present

## 2017-01-21 DIAGNOSIS — E559 Vitamin D deficiency, unspecified: Secondary | ICD-10-CM | POA: Diagnosis not present

## 2017-01-21 DIAGNOSIS — N390 Urinary tract infection, site not specified: Secondary | ICD-10-CM | POA: Diagnosis not present

## 2017-01-23 MED FILL — clonazePAM 1 MG TABS: 1 | 30 days supply | Qty: 30 | Fill #0

## 2017-01-28 DIAGNOSIS — L820 Inflamed seborrheic keratosis: Secondary | ICD-10-CM | POA: Diagnosis not present

## 2017-01-28 DIAGNOSIS — K219 Gastro-esophageal reflux disease without esophagitis: Secondary | ICD-10-CM | POA: Diagnosis not present

## 2017-01-28 DIAGNOSIS — E039 Hypothyroidism, unspecified: Secondary | ICD-10-CM | POA: Diagnosis not present

## 2017-01-28 DIAGNOSIS — Z0000 Encounter for general adult medical examination without abnormal findings: Secondary | ICD-10-CM | POA: Diagnosis not present

## 2017-01-28 DIAGNOSIS — I10 Essential (primary) hypertension: Secondary | ICD-10-CM | POA: Diagnosis not present

## 2017-02-18 DIAGNOSIS — K219 Gastro-esophageal reflux disease without esophagitis: Secondary | ICD-10-CM | POA: Diagnosis not present

## 2017-02-18 DIAGNOSIS — R634 Abnormal weight loss: Secondary | ICD-10-CM | POA: Diagnosis not present

## 2017-02-18 DIAGNOSIS — R1013 Epigastric pain: Secondary | ICD-10-CM | POA: Diagnosis not present

## 2017-02-19 MED FILL — DEXILANT DR 60 MG CAPSULE: 60 | 90 days supply | Qty: 90 | Fill #1

## 2017-02-24 ENCOUNTER — Other Ambulatory Visit: Payer: Self-pay | Admitting: Gastroenterology

## 2017-02-24 ENCOUNTER — Encounter (HOSPITAL_COMMUNITY): Payer: Self-pay | Admitting: *Deleted

## 2017-02-24 DIAGNOSIS — R634 Abnormal weight loss: Secondary | ICD-10-CM | POA: Diagnosis not present

## 2017-02-24 DIAGNOSIS — K219 Gastro-esophageal reflux disease without esophagitis: Secondary | ICD-10-CM | POA: Diagnosis not present

## 2017-02-24 DIAGNOSIS — R1013 Epigastric pain: Secondary | ICD-10-CM | POA: Diagnosis not present

## 2017-02-24 DIAGNOSIS — K449 Diaphragmatic hernia without obstruction or gangrene: Secondary | ICD-10-CM | POA: Diagnosis not present

## 2017-02-25 ENCOUNTER — Ambulatory Visit (HOSPITAL_COMMUNITY): Admit: 2017-02-25 | Payer: 59 | Admitting: Gastroenterology

## 2017-02-25 ENCOUNTER — Encounter (HOSPITAL_COMMUNITY): Payer: Self-pay

## 2017-02-25 ENCOUNTER — Ambulatory Visit (HOSPITAL_COMMUNITY): Payer: 59 | Admitting: Anesthesiology

## 2017-02-25 ENCOUNTER — Ambulatory Visit (HOSPITAL_COMMUNITY)
Admission: RE | Admit: 2017-02-25 | Discharge: 2017-02-25 | Disposition: A | Discharge status: Home / Self Care | Payer: 59 | Source: Ambulatory Visit | Attending: Gastroenterology | Admitting: Gastroenterology

## 2017-02-25 ENCOUNTER — Encounter (HOSPITAL_COMMUNITY)
Admission: RE | Disposition: A | Discharge status: Home / Self Care | Payer: Self-pay | Source: Ambulatory Visit | Attending: Gastroenterology

## 2017-02-25 DIAGNOSIS — K317 Polyp of stomach and duodenum: Secondary | ICD-10-CM | POA: Diagnosis not present

## 2017-02-25 DIAGNOSIS — I10 Essential (primary) hypertension: Secondary | ICD-10-CM | POA: Insufficient documentation

## 2017-02-25 DIAGNOSIS — F419 Anxiety disorder, unspecified: Secondary | ICD-10-CM | POA: Insufficient documentation

## 2017-02-25 DIAGNOSIS — E039 Hypothyroidism, unspecified: Secondary | ICD-10-CM | POA: Diagnosis not present

## 2017-02-25 DIAGNOSIS — Z79899 Other long term (current) drug therapy: Secondary | ICD-10-CM | POA: Diagnosis not present

## 2017-02-25 DIAGNOSIS — R1013 Epigastric pain: Secondary | ICD-10-CM | POA: Diagnosis not present

## 2017-02-25 DIAGNOSIS — K3189 Other diseases of stomach and duodenum: Secondary | ICD-10-CM | POA: Diagnosis not present

## 2017-02-25 DIAGNOSIS — K449 Diaphragmatic hernia without obstruction or gangrene: Secondary | ICD-10-CM | POA: Diagnosis not present

## 2017-02-25 HISTORY — PX: UPPER ESOPHAGEAL ENDOSCOPIC ULTRASOUND (EUS): SHX6562

## 2017-02-25 SURGERY — UPPER ESOPHAGEAL ENDOSCOPIC ULTRASOUND (EUS)
Anesthesia: Monitor Anesthesia Care

## 2017-02-25 SURGERY — UPPER ENDOSCOPIC ULTRASOUND (EUS) LINEAR
Anesthesia: Monitor Anesthesia Care | Laterality: Left

## 2017-02-25 MED ORDER — EPINEPHRINE PF 1 MG/10ML IJ SOSY
PREFILLED_SYRINGE | INTRAMUSCULAR | Status: AC
  Filled 2017-02-25: qty 10

## 2017-02-25 MED ORDER — LIDOCAINE 2% (20 MG/ML) 5 ML SYRINGE
INTRAMUSCULAR | Status: AC
  Filled 2017-02-25: qty 5

## 2017-02-25 MED ORDER — PROPOFOL 10 MG/ML IV BOLUS
INTRAVENOUS | Status: AC
  Filled 2017-02-25: qty 20

## 2017-02-25 MED ORDER — EPINEPHRINE PF 1 MG/10ML IJ SOSY
PREFILLED_SYRINGE | INTRAMUSCULAR | Status: DC | PRN
  Administered 2017-02-25: 3 mL

## 2017-02-25 MED ORDER — PROPOFOL 500 MG/50ML IV EMUL
INTRAVENOUS | Status: DC | PRN
  Administered 2017-02-25: 150 ug/kg/min via INTRAVENOUS

## 2017-02-25 MED ORDER — ONDANSETRON HCL 4 MG/2ML IJ SOLN
INTRAMUSCULAR | Status: AC
  Filled 2017-02-25: qty 2

## 2017-02-25 MED ORDER — SODIUM CHLORIDE 0.9 % IV SOLN: INTRAVENOUS | Status: DC

## 2017-02-25 MED ORDER — ONDANSETRON HCL 4 MG/2ML IJ SOLN
4.0000 mg | Freq: Once | INTRAMUSCULAR | Status: AC
  Administered 2017-02-25: 4 mg via INTRAVENOUS

## 2017-02-25 MED ORDER — PROPOFOL 10 MG/ML IV BOLUS
INTRAVENOUS | Status: DC | PRN
  Administered 2017-02-25 (×8): 20 mg via INTRAVENOUS

## 2017-02-25 MED ORDER — LIDOCAINE 2% (20 MG/ML) 5 ML SYRINGE
INTRAMUSCULAR | Status: DC | PRN
  Administered 2017-02-25: 60 mg via INTRAVENOUS

## 2017-02-25 MED ORDER — LACTATED RINGERS IV SOLN
INTRAVENOUS | Status: DC | PRN
  Administered 2017-02-25: 12:00:00 via INTRAVENOUS

## 2017-02-25 NOTE — Op Note (Signed)
[facility] Patient Name: Sue [name] Procedure Date: 02/25/2017 MRN: [id number] Attending MD: Carol Ada , MD Date of Birth: 08-07-1960 CSN: [id number] Age: 56 Admit Type: Outpatient Procedure:                Upper EUS Indications:              Gastric mucosal mass/polyp found on endoscopy Providers:                Carol Ada, MD, Burtis Junes, RN, Cherylynn Ridges,                            Technician, Dione Booze, CRNA Referring MD:              Medicines:                Propofol per Anesthesia Complications:            No immediate complications. Estimated Blood Loss:     Estimated blood loss was minimal. Procedure:                Pre-Anesthesia Assessment:                           - Prior to the procedure, a History and Physical                            was performed, and patient medications and                            allergies were reviewed. The patient's tolerance of                            previous anesthesia was also reviewed. The risks                            and benefits of the procedure and the sedation                            options and risks were discussed with the patient.                            All questions were answered, and informed consent                            was obtained. Prior Anticoagulants: The patient has                            taken no previous anticoagulant or antiplatelet                            agents. ASA Grade Assessment: II - A patient with                            mild systemic disease. After reviewing the risks  and benefits, the patient was deemed in                            satisfactory condition to undergo the procedure.                           - Sedation was administered by an anesthesia                            professional. Deep sedation was attained.                           After obtaining informed consent, the endoscope was   passed under direct vision. Throughout the                            procedure, the patient's blood pressure, pulse, and                            oxygen saturations were monitored continuously. The                            KG-[id number] ([id number]) scope was introduced through                            the mouth, and advanced to the second part of                            duodenum. The upper EUS was technically difficult                            and complex. The patient tolerated the procedure                            fairly well. Scope In: Scope Out: Findings:      Endoscopic Finding :      A single 25 mm pedunculated and sessile polyp with no bleeding and no       stigmata of recent bleeding was found at the gastroesophageal junction.       This was biopsied with a hot snare for histology.      The EUS was initially used to ensure that the lesion identified was not       a varix. The lesion was clearly a polypoid lesion with some vasculature.       The polyp was in the gastric cardia within a 3 cm hiatal hernia sac. The       base of the lesion was broad based. A total of 3 ml of 1:10,000 Epi was       injected to reduce the amount of bleeding with sampling. In a       retroflexed postion the distal portion of the polyp was removed with a       hot snare. The intent was to removed the lesion in a piecemeal fashion       as visualization was difficult with the peristalsis and patient       coughing. After the initial resection  it was clear that the base was       much too wide to be safely removed given its location just below the       Z-line. Two hemoclips were successfully deployed across the resection       site to prophylax against bleeding. Impression:               - A single gastroesophageal junction polyp.                            Biopsied. Moderate Sedation:      N/A- Per Anesthesia Care Recommendation:           - Patient has a contact number available for                             emergencies. The signs and symptoms of potential                            delayed complications were discussed with the                            patient. Return to normal activities tomorrow.                            Written discharge instructions were provided to the                            patient.                           - Resume previous diet.                           - Await pathology results.                           - Patient may need surgical resection versus                            referral to a tertiary referral center for                            endoscopic resction. Procedure Code(s):        --- Professional ---                           [phone], Esophagogastroduodenoscopy, flexible,                            transoral; with removal of tumor(s), polyp(s), or                            other lesion(s) by snare technique Diagnosis Code(s):        --- Professional ---                           K31.7, Polyp of stomach and  duodenum                           K31.89, Other diseases of stomach and duodenum CPT copyright 2016 American Medical Association. All rights reserved. The codes documented in this report are preliminary and upon coder review may  be revised to meet current compliance requirements. Carol Ada, MD Carol Ada, MD 02/25/2017 1:41:40 PM This report has been signed electronically. Number of Addenda: 0

## 2017-02-25 NOTE — H&P (Signed)
Sue [name] HPI: This is a 56 year old female who underwent an EGD with Dr. Collene Mares yesterday with complaints of epigastric pain.  The EGD was revealing for a polypoid lesion, but there was concern for the possibility of vascularity.  No attempts to biopsy or polypectomy was performed until she underwent further evaluation with an EUS.  Past Medical History:  Diagnosis Date  . Anxiety   . Hypertension   . Hypothyroid     Past Surgical History:  Procedure Laterality Date  . TONSILLECTOMY  3rd grade    Family History  Problem Relation Age of Onset  . Uterine cancer Maternal Grandmother   . Colon cancer Unknown        Paternal Event organiser  . Breast cancer Unknown        Maternal Great Grandmother  . Hypertension Father   . Heart attack Father   . Hypertension Brother   . Lupus Mother   . Osteoporosis Mother     Social History:  reports that she has never smoked. She has never used smokeless tobacco. She reports that she drinks about 3.5 oz of alcohol per week . She reports that she does not use drugs.  Allergies:  Allergies  Allergen Reactions  . Oxycodone Itching    Medications:  Scheduled:  Continuous: . sodium chloride      No results found for this or any previous visit (from the past 24 hour(s)).   No results found.  ROS:  As stated above in the HPI otherwise negative.  Blood pressure (!) 177/95, pulse (!) 57, temperature 98.3 F (36.8 C), temperature source Oral, resp. rate 12, height 5\' 2"  (1.575 m), weight 68 kg (150 lb), last menstrual period 10/30/2012, SpO2 98 %.    PE: Gen: NAD, Alert and Oriented HEENT:  [place]/AT, EOMI Neck: Supple, no LAD Lungs: CTA Bilaterally CV: RRR without M/G/R ABM: Soft, NTND, +BS Ext: No C/C/E  Assessment/Plan: 1) GE junction polypoid lesion -  EUS with cold biopsy versus polypectomy if there is no evidence of vascularity.  Chella Chapdelaine D 02/25/2017, 12:42 PM

## 2017-02-25 NOTE — Discharge Instructions (Signed)
YOU HAD AN ENDOSCOPIC PROCEDURE TODAY: Refer to the procedure report and other information in the discharge instructions given to you for any specific questions about what was found during the examination. If this information does not answer your questions, please call [facility] at [phone] to clarify.   YOU SHOULD EXPECT: Some feelings of bloating in the abdomen. Passage of more gas than usual. Walking can help get rid of the air that was put into your GI tract during the procedure and reduce the bloating.  DIET: Your first meal following the procedure should be a light meal and then it is ok to progress to your normal diet. A half-sandwich or bowl of soup is an example of a good first meal. Heavy or fried foods are harder to digest and may make you feel nauseous or bloated. Drink plenty of fluids but you should avoid alcoholic beverages for 24 hours. If you had an esophageal dilation, please see attached information for diet.   ACTIVITY: Your care partner should take you home directly after the procedure. You should plan to take it easy, moving slowly for the rest of the day. You can resume normal activity the day after the procedure however YOU SHOULD NOT DRIVE, use power tools, machinery or perform tasks that involve climbing or major physical exertion for 24 hours (because of the sedation medicines used during the test).   SYMPTOMS TO REPORT IMMEDIATELY: A gastroenterologist can be reached at any hour. Please call [phone]  for any of the following symptoms:   Following upper endoscopy (EGD, EUS, ERCP, esophageal dilation) Vomiting of blood or coffee ground material  New, significant abdominal pain  New, significant chest pain or pain under the shoulder blades  Painful or persistently difficult swallowing  New shortness of breath  Black, tarry-looking or red, bloody stools  FOLLOW UP:  If any biopsies were taken you will be contacted by phone or by letter within the next  1-3 weeks. Call [phone]  if you have not heard about the biopsies in 3 weeks.  Please also call with any specific questions about appointments or follow up tests.  

## 2017-02-25 NOTE — Anesthesia Postprocedure Evaluation (Signed)
Anesthesia Post Note  Patient: Sue [name]  Procedure(s) Performed: Procedure(s) (LRB): UPPER ESOPHAGEAL ENDOSCOPIC ULTRASOUND (EUS) (N/A)     Patient location during evaluation: PACU Anesthesia Type: MAC Level of consciousness: awake and alert Pain management: pain level controlled Vital Signs Assessment: post-procedure vital signs reviewed and stable Respiratory status: spontaneous breathing, nonlabored ventilation, respiratory function stable and patient connected to nasal cannula oxygen Cardiovascular status: stable and blood pressure returned to baseline Postop Assessment: no apparent nausea or vomiting Anesthetic complications: no    Last Vitals:  Vitals:   02/25/17 1350 02/25/17 1400  BP: (!) 201/117 (!) 182/97  Pulse: 70 (!) 58  Resp: 14 12  Temp:    SpO2: 100% 100%    Last Pain:  Vitals:   02/25/17 1408  TempSrc:   PainSc: 5                  Krisha Beegle S

## 2017-02-25 NOTE — Anesthesia Procedure Notes (Signed)
Procedure Name: MAC Date/Time: 02/25/2017 12:56 PM Performed by: Dione Booze Pre-anesthesia Checklist: Patient identified, Emergency Drugs available, Suction available and Patient being monitored Patient Re-evaluated:Patient Re-evaluated prior to induction Oxygen Delivery Method: Nasal cannula Induction Type: IV induction Placement Confirmation: positive ETCO2

## 2017-02-25 NOTE — Transfer of Care (Signed)
Immediate Anesthesia Transfer of Care Note  Patient: Sue [name]  Procedure(s) Performed: Procedure(s): UPPER ESOPHAGEAL ENDOSCOPIC ULTRASOUND (EUS) (N/A)  Patient Location: PACU and Endoscopy Unit  Anesthesia Type:MAC  Level of Consciousness: awake and patient cooperative  Airway & Oxygen Therapy: Patient Spontanous Breathing and Patient connected to nasal cannula oxygen  Post-op Assessment: Report given to RN and Post -op Vital signs reviewed and stable  Post vital signs: Reviewed and stable  Last Vitals:  Vitals:   02/25/17 1209  BP: (!) 177/95  Pulse: (!) 57  Resp: 12  Temp: 36.8 C  SpO2: 98%    Last Pain:  Vitals:   02/25/17 1209  TempSrc: Oral         Complications: No apparent anesthesia complications

## 2017-02-25 NOTE — Anesthesia Preprocedure Evaluation (Signed)
Anesthesia Evaluation  Patient identified by MRN, date of birth, ID band Patient awake    Reviewed: Allergy & Precautions, NPO status , Patient's Chart, lab work & pertinent test results  Airway Mallampati: II  TM Distance: >3 FB Neck ROM: Full    Dental no notable dental hx.    Pulmonary neg pulmonary ROS,    Pulmonary exam normal breath sounds clear to auscultation       Cardiovascular hypertension, Normal cardiovascular exam Rhythm:Regular Rate:Normal     Neuro/Psych negative neurological ROS  negative psych ROS   GI/Hepatic negative GI ROS, Neg liver ROS,   Endo/Other  Hypothyroidism   Renal/GU negative Renal ROS  negative genitourinary   Musculoskeletal negative musculoskeletal ROS (+)   Abdominal   Peds negative pediatric ROS (+)  Hematology negative hematology ROS (+)   Anesthesia Other Findings   Reproductive/Obstetrics negative OB ROS                             Anesthesia Physical Anesthesia Plan  ASA: II  Anesthesia Plan: MAC   Post-op Pain Management:    Induction: Intravenous  PONV Risk Score and Plan: 0 and Treatment may vary due to age or medical condition  Airway Management Planned: Simple Face Mask  Additional Equipment:   Intra-op Plan:   Post-operative Plan:   Informed Consent: I have reviewed the patients History and Physical, chart, labs and discussed the procedure including the risks, benefits and alternatives for the proposed anesthesia with the patient or authorized representative who has indicated his/her understanding and acceptance.   Dental advisory given  Plan Discussed with: CRNA and Surgeon  Anesthesia Plan Comments:         Anesthesia Quick Evaluation

## 2017-02-26 ENCOUNTER — Encounter (HOSPITAL_COMMUNITY): Payer: Self-pay | Admitting: Gastroenterology

## 2017-02-27 ENCOUNTER — Other Ambulatory Visit: Payer: Self-pay | Admitting: Internal Medicine

## 2017-02-28 MED FILL — clonazePAM 1 MG TABS: 1 | 30 days supply | Qty: 30 | Fill #0

## 2017-02-28 MED FILL — LOSARTAN-HCTZ 100-12.5 MG T: 100-12.5 | 90 days supply | Qty: 90 | Fill #0

## 2017-02-28 MED FILL — LEVOTHYROXINE 25 MCG TABLET: 25 | 90 days supply | Qty: 90 | Fill #0

## 2017-03-13 DIAGNOSIS — F419 Anxiety disorder, unspecified: Secondary | ICD-10-CM | POA: Diagnosis not present

## 2017-03-13 DIAGNOSIS — I10 Essential (primary) hypertension: Secondary | ICD-10-CM | POA: Diagnosis not present

## 2017-03-13 MED FILL — SERTRALINE HCL 50 MG TABLET: 50 | 30 days supply | Qty: 30 | Fill #0

## 2017-03-14 MED FILL — LOSARTAN-HCTZ 100-25 MG TAB: 100-25 | 30 days supply | Qty: 30 | Fill #0

## 2017-03-19 MED FILL — FLUoxetine HCL 40 MG CAPS: 40 | 90 days supply | Qty: 90 | Fill #0

## 2017-03-26 DIAGNOSIS — I10 Essential (primary) hypertension: Secondary | ICD-10-CM | POA: Diagnosis not present

## 2017-03-26 DIAGNOSIS — F329 Major depressive disorder, single episode, unspecified: Secondary | ICD-10-CM | POA: Diagnosis not present

## 2017-03-26 DIAGNOSIS — F419 Anxiety disorder, unspecified: Secondary | ICD-10-CM | POA: Diagnosis not present

## 2017-03-28 MED FILL — NexIUM 40 MG CPDR: 40 | 30 days supply | Qty: 30 | Fill #0

## 2017-04-01 MED FILL — clonazePAM 1 MG TABS: 1 | 30 days supply | Qty: 30 | Fill #1

## 2017-04-14 MED FILL — SERTRALINE HCL 50 MG TABLET: 50 | 90 days supply | Qty: 90 | Fill #0

## 2017-04-15 ENCOUNTER — Other Ambulatory Visit (INDEPENDENT_AMBULATORY_CARE_PROVIDER_SITE_OTHER): Payer: Self-pay | Admitting: Orthopaedic Surgery

## 2017-04-15 ENCOUNTER — Ambulatory Visit (INDEPENDENT_AMBULATORY_CARE_PROVIDER_SITE_OTHER): Payer: 59

## 2017-04-15 ENCOUNTER — Encounter (INDEPENDENT_AMBULATORY_CARE_PROVIDER_SITE_OTHER): Payer: Self-pay | Admitting: Orthopaedic Surgery

## 2017-04-15 ENCOUNTER — Ambulatory Visit (INDEPENDENT_AMBULATORY_CARE_PROVIDER_SITE_OTHER): Payer: 59 | Admitting: Orthopaedic Surgery

## 2017-04-15 VITALS — BP 127/92 | HR 74 | Resp 12 | Ht 62.0 in | Wt 150.0 lb

## 2017-04-15 DIAGNOSIS — G8929 Other chronic pain: Secondary | ICD-10-CM

## 2017-04-15 DIAGNOSIS — M25512 Pain in left shoulder: Secondary | ICD-10-CM

## 2017-04-15 MED FILL — NexIUM 40 MG CPDR: 40 | 30 days supply | Qty: 30 | Fill #1

## 2017-04-15 MED FILL — LOSARTAN-HCTZ 100-25 MG TAB: 100-25 | 90 days supply | Qty: 90 | Fill #0

## 2017-04-15 NOTE — Progress Notes (Signed)
Office Visit Note   Patient: Sue [name]           Date of Birth: Jul 19, 1960           MRN: [id number] Visit Date: 04/15/2017              Requested by: Thressa Sheller, [employer], [place] [employer], [place] [address] PCP: Thressa Sheller, MD   Assessment & Plan: Visit Diagnoses:  1. Chronic left shoulder pain     Plan: Initial onset of left shoulder pain approximately 8 months ago treated with a cortisone injection about the biceps tendon by her primary care physician . had excellent relief until just recently without recurrent injury or trauma. Diagnostic possibilities include recurrent biceps tendinitis or rotator cuff tear. Would like to obtain an MRI scan as the pain has recurred and is been resistant exercises and anti-inflammatory medicines.  Follow-Up Instructions: No Follow-up on file.   Orders:  Orders Placed This Encounter  Procedures  . XR Shoulder Left   No orders of the defined types were placed in this encounter.     Procedures: No procedures performed   Clinical Data: No additional findings.   Subjective: Chief Complaint  Patient presents with  . Left Shoulder - Pain, Weakness    Ms. Sue [name] is a 56 y o with a Strain at the long head of the biceps/partial tear per PCP in March 2018.  Initial onset of left shoulder pain in March 2018. Left arm was "jerked" while she was walking her dog. She was seen by her primary care physician who diagnosed biceps tendinitis. This was injected with cortisone under ultrasound with excellent relief of her pain until just recently. Without recurrent injury or trauma she's had recurrent pain 1 the anterior aspect of her left shoulder into the biceps tendon. She's having trouble sleeping and raising her arm over her head. She's also had some difficulty at work holding her arm in certain positions. No neck pain. No referred discomfort either upper extremity  HPI  Review of Systems  Constitutional: Negative  for chills, fatigue and fever.  Eyes: Negative for itching.  Respiratory: Positive for cough. Negative for chest tightness and shortness of breath.   Cardiovascular: Negative for chest pain, palpitations and leg swelling.  Gastrointestinal: Negative for blood in stool, constipation and diarrhea.  Endocrine: Negative for polyuria.  Genitourinary: Negative for dysuria.  Musculoskeletal: Negative for back pain, joint swelling, neck pain and neck stiffness.  Allergic/Immunologic: Negative for immunocompromised state.  Neurological: Negative for dizziness and numbness.  Hematological: Does not bruise/bleed easily.  Psychiatric/Behavioral: The patient is not nervous/anxious.      Objective: Vital Signs: BP (!) 127/92   Pulse 74   Resp 12   Ht 5\' 2"  (1.575 m)   Wt 150 lb (68 kg)   LMP 10/30/2012   BMI 27.44 kg/m   Physical Exam  Ortho Exam awake alert and oriented 3. Comfortable sitting. Left shoulder exam with full overhead motion. Some in the area of the proximal biceps tendon. Biceps muscle is intact. Positive speed sign. Some pain with internal/external rotation of the shoulder in the impingement position. Minimally positive empty can test. Good strength. Skin intact. Neurovascular exam intact. Painless range of motion of cervical spine  Specialty Comments:  No specialty comments available.  Imaging: Xr Shoulder Left  Result Date: 04/15/2017 Films of the left shoulder pain in several projections. The humeral head is centered about the glenoid. No ectopic calcification. Normal space between  the humeral head and acromion. Type II acromion. No evidence of arthritis. Left lung field appeared to be clear    PMFS History: Patient Active Problem List   Diagnosis Date Noted  . Left shoulder pain 08/16/2016  . Fracture of rib of right side 01/31/2016  . Acute bronchitis 07/22/2014  . Sebaceous cyst 06/30/2014  . Hypothyroid   . Hypertension   . Anxiety    Past Medical History:   Diagnosis Date  . Anxiety   . Hypertension   . Hypothyroid     Family History  Problem Relation Age of Onset  . Uterine cancer Maternal Grandmother   . Colon cancer Unknown        Paternal Event organiser  . Breast cancer Unknown        Maternal Great Grandmother  . Hypertension Father   . Heart attack Father   . Hypertension Brother   . Lupus Mother   . Osteoporosis Mother     Past Surgical History:  Procedure Laterality Date  . TONSILLECTOMY  3rd grade   Social History   Occupational History  . Not on file  Tobacco Use  . Smoking status: Never Smoker  . Smokeless tobacco: Never Used  Substance and Sexual Activity  . Alcohol use: Yes    Alcohol/week: 3.5 oz    Types: 7 Standard drinks or equivalent per week    Comment: 5 per week  . Drug use: No  . Sexual activity: No    Partners: Male

## 2017-04-16 ENCOUNTER — Ambulatory Visit (HOSPITAL_COMMUNITY)
Admission: RE | Admit: 2017-04-16 | Discharge: 2017-04-16 | Disposition: A | Discharge status: Home / Self Care | Payer: 59 | Source: Ambulatory Visit | Attending: Orthopaedic Surgery | Admitting: Orthopaedic Surgery

## 2017-04-16 DIAGNOSIS — G8929 Other chronic pain: Secondary | ICD-10-CM

## 2017-04-16 DIAGNOSIS — M25512 Pain in left shoulder: Secondary | ICD-10-CM

## 2017-04-16 DIAGNOSIS — M7582 Other shoulder lesions, left shoulder: Secondary | ICD-10-CM | POA: Insufficient documentation

## 2017-04-18 ENCOUNTER — Other Ambulatory Visit (INDEPENDENT_AMBULATORY_CARE_PROVIDER_SITE_OTHER): Payer: Self-pay | Admitting: *Deleted

## 2017-04-18 ENCOUNTER — Ambulatory Visit (INDEPENDENT_AMBULATORY_CARE_PROVIDER_SITE_OTHER): Payer: 59 | Admitting: Orthopaedic Surgery

## 2017-04-18 ENCOUNTER — Encounter (INDEPENDENT_AMBULATORY_CARE_PROVIDER_SITE_OTHER): Payer: Self-pay | Admitting: Orthopaedic Surgery

## 2017-04-18 VITALS — BP 111/71 | HR 69 | Resp 16 | Ht 62.0 in | Wt 150.0 lb

## 2017-04-18 DIAGNOSIS — G8929 Other chronic pain: Secondary | ICD-10-CM | POA: Diagnosis not present

## 2017-04-18 DIAGNOSIS — M25512 Pain in left shoulder: Secondary | ICD-10-CM | POA: Diagnosis not present

## 2017-04-18 MED ORDER — BUPIVACAINE HCL 0.5 % IJ SOLN
2.0000 mL | INTRAMUSCULAR | Status: AC | PRN
  Administered 2017-04-18: 2 mL via INTRA_ARTICULAR

## 2017-04-18 MED ORDER — LIDOCAINE HCL 2 % IJ SOLN
2.0000 mL | INTRAMUSCULAR | Status: AC | PRN
  Administered 2017-04-18: 2 mL

## 2017-04-18 MED ORDER — METHYLPREDNISOLONE ACETATE 40 MG/ML IJ SUSP
80.0000 mg | INTRAMUSCULAR | Status: AC | PRN
  Administered 2017-04-18: 80 mg

## 2017-04-18 NOTE — Progress Notes (Signed)
Office Visit Note   Patient: Sue [name]           Date of Birth: 10-Oct-1960           MRN: [id number] Visit Date: 04/18/2017              Requested by: Thressa Sheller, [address], [place], [place] [address] PCP: Thressa Sheller, MD   Assessment & Plan: Visit Diagnoses:  1. Chronic left shoulder pain     Plan: MRI scan demonstrates severe tendinosis of the infraspinatus tendon social with a small partial bursal surface tear of the same tendon. Long discussion regarding treatment options. We'll try another subacromial cortisone injection today and a course of physical therapy. We'll follow as needed .Have also discussed arthroscopic subacromial decompression Follow-Up Instructions: Return if symptoms worsen or fail to improve.   Orders:  No orders of the defined types were placed in this encounter.  No orders of the defined types were placed in this encounter.     Procedures: Large Joint Inj: L subacromial bursa on 04/18/2017 10:06 AM Indications: pain and diagnostic evaluation Details: 25 G 1.5 in needle, anterolateral approach  Arthrogram: No  Medications: 2 mL lidocaine 2 %; 2 mL bupivacaine 0.5 %; 80 mg methylPREDNISolone acetate 40 MG/ML Consent was given by the patient. Immediately prior to procedure a time out was called to verify the correct patient, procedure, equipment, support staff and site/side marked as required. Patient was prepped and draped in the usual sterile fashion.       Clinical Data: No additional findings.   Subjective: Chief Complaint  Patient presents with  . Follow-up    MRI REVIEW  Seen several days ago in the office with chronic, recurrent left shoulder pain. I ordered an MRI scan. Skin results as above. There is severe tendinosis of the infraspinatus tendons associated with a small bursal surface tear. No related shortness of breath or chest pain. Pain is worse with overhead activity  HPI  Review of  Systems   Objective: Vital Signs: BP 111/71 (BP Location: Right Arm, Patient Position: Sitting, Cuff Size: Normal)   Pulse 69   Resp 16   Ht 5\' 2"  (1.575 m)   Wt 150 lb (68 kg)   LMP 10/30/2012   BMI 27.44 kg/m   Physical Exam  Ortho Exam awake alert and oriented 3. Comfortable sitting. Painful arc of overhead motion. Positive impingement and empty can testing. Some local tenderness in the lateral subacromial region skin intact. Neurovascular exam intact. Good strength. Biceps intact. No pain with range of motion of cervical spine .  No specialty comments available.  Imaging: No results found.   PMFS History: Patient Active Problem List   Diagnosis Date Noted  . Left shoulder pain 08/16/2016  . Fracture of rib of right side 01/31/2016  . Acute bronchitis 07/22/2014  . Sebaceous cyst 06/30/2014  . Hypothyroid   . Hypertension   . Anxiety    Past Medical History:  Diagnosis Date  . Anxiety   . Hypertension   . Hypothyroid     Family History  Problem Relation Age of Onset  . Uterine cancer Maternal Grandmother   . Colon cancer Unknown        Paternal Event organiser  . Breast cancer Unknown        Maternal Great Grandmother  . Hypertension Father   . Heart attack Father   . Hypertension Brother   . Lupus Mother   . Osteoporosis  Mother     Past Surgical History:  Procedure Laterality Date  . TONSILLECTOMY  3rd grade  . UPPER ESOPHAGEAL ENDOSCOPIC ULTRASOUND (EUS) N/A 02/25/2017   Performed by Carol Ada, MD at [place] History   Occupational History  . Not on file  Tobacco Use  . Smoking status: Never Smoker  . Smokeless tobacco: Never Used  Substance and Sexual Activity  . Alcohol use: Yes    Alcohol/week: 4.2 oz    Types: 7 Glasses of wine per week  . Drug use: No  . Sexual activity: No    Partners: Male     Garald Balding, MD   Note - This record has been created using [employer].  Chart creation errors have been  sought, but may not always  have been located. Such creation errors do not reflect on  the standard of medical care.

## 2017-04-29 DIAGNOSIS — K449 Diaphragmatic hernia without obstruction or gangrene: Secondary | ICD-10-CM | POA: Diagnosis not present

## 2017-04-29 DIAGNOSIS — K317 Polyp of stomach and duodenum: Secondary | ICD-10-CM | POA: Diagnosis not present

## 2017-04-29 DIAGNOSIS — F419 Anxiety disorder, unspecified: Secondary | ICD-10-CM | POA: Diagnosis not present

## 2017-04-29 DIAGNOSIS — Z79890 Hormone replacement therapy (postmenopausal): Secondary | ICD-10-CM | POA: Diagnosis not present

## 2017-04-29 DIAGNOSIS — I10 Essential (primary) hypertension: Secondary | ICD-10-CM | POA: Diagnosis not present

## 2017-04-29 DIAGNOSIS — K219 Gastro-esophageal reflux disease without esophagitis: Secondary | ICD-10-CM | POA: Diagnosis not present

## 2017-04-29 DIAGNOSIS — Z79899 Other long term (current) drug therapy: Secondary | ICD-10-CM | POA: Diagnosis not present

## 2017-04-29 DIAGNOSIS — F329 Major depressive disorder, single episode, unspecified: Secondary | ICD-10-CM | POA: Diagnosis not present

## 2017-04-29 DIAGNOSIS — E039 Hypothyroidism, unspecified: Secondary | ICD-10-CM | POA: Diagnosis not present

## 2017-05-02 MED FILL — clonazePAM 1 MG TABS: 1 | 30 days supply | Qty: 30 | Fill #0

## 2017-05-05 ENCOUNTER — Ambulatory Visit: Payer: 59 | Attending: Orthopaedic Surgery

## 2017-05-05 DIAGNOSIS — M25512 Pain in left shoulder: Secondary | ICD-10-CM | POA: Insufficient documentation

## 2017-05-05 DIAGNOSIS — R293 Abnormal posture: Secondary | ICD-10-CM | POA: Insufficient documentation

## 2017-05-05 DIAGNOSIS — G8929 Other chronic pain: Secondary | ICD-10-CM | POA: Insufficient documentation

## 2017-05-05 NOTE — Therapy (Addendum)
[place], [place], [address] Phone: [phone]   Fax:  [phone]  Physical Therapy Evaluation/Discharge  Patient Details  Name: Sue [name] MRN: [id number] Date of Birth: [birth year] Referring Provider: Ilma Fears , MD   Encounter Date: 05/05/2017  PT End of Session - 05/05/17 1154    Visit Number  1    Number of Visits  1    Date for PT Re-Evaluation  05/05/17    PT Start Time  8592    PT Stop Time  1222    PT Time Calculation (min)  37 min    Activity Tolerance  Patient tolerated treatment well;No increased pain    Behavior During Therapy  WFL for tasks assessed/performed       Past Medical History:  Diagnosis Date  . Anxiety   . Hypertension   . Hypothyroid     Past Surgical History:  Procedure Laterality Date  . TONSILLECTOMY  3rd grade  . UPPER ESOPHAGEAL ENDOSCOPIC ULTRASOUND (EUS) N/A 02/25/2017   Procedure: UPPER ESOPHAGEAL ENDOSCOPIC ULTRASOUND (EUS);  Surgeon: Carol Ada, MD;  Location: [facility];  Service: Endoscopy;  Laterality: N/A;    There were no vitals filed for this visit.   Subjective Assessment - 05/05/17 1150    Subjective  She reports LT shoulder pain chronic . Post injection she is now doing better.   She reports no pain. Mild pain with activity..  She reports fraying of tendon.      Pertinent History  shoulder injury x 2 walking dogs.     Limitations  Lifting    How long can you sit comfortably?  NA    How long can you stand comfortably?  NA    How long can you walk comfortably?  NA    Diagnostic tests  MRI:     Patient Stated Goals  Exercises for strength    Currently in Pain?  No/denies    Pain Score  2  with lifting    Pain Location  Shoulder    Pain Orientation  Left    Pain Descriptors / Indicators  Aching    Pain Type  -- sub acute    Pain Onset  More than a month ago    Pain Frequency  Intermittent    Aggravating Factors   lifting  heavier items    Pain Relieving Factors  injection . rest. stop activity    Multiple Pain Sites  No         [facility] PT Assessment - 05/05/17 0001      Assessment   Medical Diagnosis  Chronic LT shoulder    Referring Provider  Aireanna Fears , MD    Onset Date/Surgical Date  -- 6 weeks ago    Hand Dominance  Right    Next MD Visit  As needed    Prior Therapy  No      Precautions   Precautions  None      Restrictions   Weight Bearing Restrictions  No      Balance Screen   Has the patient fallen in the past 6 months  No    Has the patient had a decrease in activity level because of a fear of falling?   No    Is the patient reluctant to leave their home because of a fear of falling?   No      Prior Function   Level of Independence  Independent  Vocation Requirements  computer work      Charity fundraiser Status  Within Functional Limits for tasks assessed      Posture/Postural Control   Posture Comments  mild forward shoulders      ROM / Strength   AROM / PROM / Strength  AROM;Strength      AROM   Overall AROM   Within functional limits for tasks performed    Overall AROM Comments  LT equals RT.       Strength   Overall Strength  Within functional limits for tasks performed    Overall Strength Comments  but with some discomfort  anterior LT shoulder      Ambulation/Gait   Gait Comments  Normal             Objective measurements completed on examination: See above findings.              PT Education - 05/05/17 1231    Education provided  Yes    Education Details  HEP    Person(s) Educated  Patient    Methods  Explanation;Demonstration;Tactile cues;Verbal cues    Comprehension  Returned demonstration;Verbalized understanding                  Plan - 05/05/17 1232    Clinical Impression Statement  Sue [name] presents today much improved post injection with only mild pain with end range stretching and lifting heavier  objects. Her ROM is equal to Rt and stength WNL.  She is only interested in receiving a HEP and does not want PT beyond 1 visit    History and Personal Factors relevant to plan of care:  shoulder injury x2     Clinical Presentation  Stable    Clinical Decision Making  Low    Rehab Potential  Good    PT Frequency  One time visit    PT Treatment/Interventions  Patient/family education;Therapeutic exercise    PT Next Visit Plan  None as only 1x visit    PT Home Exercise Plan  Band exer [place] , attached and unattached scapula and prone ITY  issued red/green/blue bands    Consulted and Agree with Plan of Care  Patient       Patient will benefit from skilled therapeutic intervention in order to improve the following deficits and impairments:     Visit Diagnosis: Chronic left shoulder pain  Abnormal posture     Problem List Patient Active Problem List   Diagnosis Date Noted  . Left shoulder pain 08/16/2016  . Fracture of rib of right side 01/31/2016  . Acute bronchitis 07/22/2014  . Sebaceous cyst 06/30/2014  . Hypothyroid   . Hypertension   . Anxiety     Sue [name]  PT 05/05/2017, 12:45 PM  [facility] [address] [place], [place], [address] Phone: [phone]   Fax:  [phone]  Name: Sue [name] MRN: [id number] Date of Birth: April 16, 1961 PHYSICAL THERAPY DISCHARGE SUMMARY  Visits from Start of Care: Eval only  Current functional level related to goals / functional outcomes: See above   Remaining deficits: See above   Education / Equipment: HEP Plan: Patient agrees to discharge.  Patient goals were met. Patient is being discharged due to being pleased with the current functional level.  ?????

## 2017-05-05 NOTE — Patient Instructions (Signed)
(  Clinic) Flexion: Horizontal Abduction (Multiple Resist) Resistance: Single Arm Punch   De espaldas al punto de agarre. Pulgar hacia adentro empuje el brazo derecho hacia adelante. Extienda completamente el codo y lleve el hombro hacia adelante tanto como le sea posible. Rote el tronco. Repita ___ veces. Repita con el otro brazo por rutina. Descanse ___ segundos despus de cada rutina. Realice ___ rutinas por sesin.  http://plyo.exer.us/224   Copyright  VHI. All rights reserved.  EXTENSION: Standing - Resistance Band: Stable (Active)   Stand, right arm at side. Against yellow resistance band, draw arm backward, as far as possible, keeping elbow straight. Complete ___ sets of ___ repetitions. Perform ___ sessions per day.  Copyright  VHI. All rights reserved.  EXTERNAL ROTATION: Sitting - Resistance Band (Active)   Sit, right arm elbow bent to 90, elbow against side, hand forward. Against yellow resistance band, rotate forearm outward, keeping elbow at side. Rotate forearm outward as far as possible. Complete ___ sets of ___ repetitions. Perform ___ sessions per day.  Copyright  VHI. All rights reserved.  Internal Rotation (Resistive Band)   Doble el codo en ngulo recto y mantngalo firmemente contra su cintura. Fije la banda al picaporte de la puerta y tire Medical sales representative. Mantenga ____ segundos. Repita ____ veces. Haga ____ sesiones por da.  Copyright  VHI. All rights reserved.  (Home) External Rotation: With Abduction - Sitting   Opposite side toward anchor, right arm supported, elbow out, bent to 90, forearm across body. Rotate shoulder by pulling hand away from body. Keep elbow bent to 90. Repeat ____ times per set. Do ____ sets per session. Do ____ sessions per week. Use ____ lb weights.  Copyright  VHI. All rights reserved.   ALL EXERCISES 10-25 REPS   1-2X/DAY HOLD 1-2 SEC.                                                                                                                                                                                                                           Also issued and reviewed prone ITY, attached and unattached scapula exercises. And bicep/tricep exercises with band and issued red/green/blue for home prgression

## 2017-05-14 MED FILL — NexIUM 40 MG CPDR: 40 | 30 days supply | Qty: 30 | Fill #0

## 2017-06-04 MED FILL — clonazePAM 1 MG TABS: 1 | 30 days supply | Qty: 30 | Fill #1

## 2017-06-04 MED FILL — LEVOTHYROXINE 25 MCG TABLET: 25 | 90 days supply | Qty: 90 | Fill #0

## 2017-06-09 MED FILL — NexIUM 40 MG CPDR: 40 | 30 days supply | Qty: 30 | Fill #1

## 2017-07-04 ENCOUNTER — Ambulatory Visit (INDEPENDENT_AMBULATORY_CARE_PROVIDER_SITE_OTHER): Payer: Self-pay | Admitting: Nurse Practitioner

## 2017-07-04 VITALS — BP 124/76 | HR 93 | Temp 99.4°F | Resp 17 | Wt 156.8 lb

## 2017-07-04 DIAGNOSIS — R6889 Other general symptoms and signs: Secondary | ICD-10-CM

## 2017-07-04 DIAGNOSIS — R112 Nausea with vomiting, unspecified: Secondary | ICD-10-CM

## 2017-07-04 LAB — POCT INFLUENZA A/B
INFLUENZA A, POC: NEGATIVE
INFLUENZA B, POC: NEGATIVE

## 2017-07-04 MED ORDER — PROMETHAZINE HCL 25 MG PO TABS: 25.0000 mg | ORAL_TABLET | Freq: Three times a day (TID) | ORAL | 0 refills | Status: DC | PRN

## 2017-07-04 MED ORDER — BENZONATATE 100 MG PO CAPS: 100.0000 mg | ORAL_CAPSULE | Freq: Two times a day (BID) | ORAL | 0 refills | Status: DC | PRN

## 2017-07-04 MED FILL — BENZONATATE 100 MG CAPS: 100 | 10 days supply | Qty: 20 | Fill #0

## 2017-07-04 MED FILL — PROMETHAZINE 25 MG TABLET: 25 | 5 days supply | Qty: 15 | Fill #0

## 2017-07-04 NOTE — Patient Instructions (Addendum)
Nausea and Vomiting, Adult Nausea is the feeling that you have an upset stomach or have to vomit. As nausea gets worse, it can lead to vomiting. Vomiting occurs when stomach contents are thrown up and out of the mouth. Vomiting can make you feel weak and cause you to become dehydrated. Dehydration can make you tired and thirsty, cause you to have a dry mouth, and decrease how often you urinate. Older adults and people with other diseases or a weak immune system are at higher risk for dehydration. It is important to treat your nausea and vomiting as told by your health care provider. Follow these instructions at home: Follow instructions from your health care provider about how to care for yourself at home. Eating and drinking Follow these recommendations as told by your health care provider:  Take an oral rehydration solution (ORS). This is a drink that is sold at pharmacies and retail stores.  Drink clear fluids in small amounts as you are able. Clear fluids include water, ice chips, diluted fruit juice, and low-calorie sports drinks.  Eat bland, easy-to-digest foods in small amounts as you are able. These foods include bananas, applesauce, rice, lean meats, toast, and crackers.  Avoid fluids that contain a lot of sugar or caffeine, such as energy drinks, sports drinks, and soda.  Avoid alcohol.  Avoid spicy or fatty foods.  General instructions  Drink enough fluid to keep your urine clear or pale yellow.  Wash your hands often. If soap and water are not available, use hand sanitizer.  Make sure that all people in your household wash their hands well and often.  Take over-the-counter and prescription medicines only as told by your health care provider.  Rest at home while you recover.  Watch your condition for any changes.  Breathe slowly and deeply when you feel nauseated.  Keep all follow-up visits as told by your health care provider. This is important. Contact a health care  provider if:  You have a fever.  You cannot keep fluids down.  Your symptoms get worse.  You have new symptoms.  Your nausea does not go away after two days.  You feel light-headed or dizzy.  You have a headache.  You have muscle cramps. Get help right away if:  You have pain in your chest, neck, arm, or jaw.  You feel extremely weak or you faint.  You have persistent vomiting.  You see blood in your vomit.  Your vomit looks like black coffee grounds.  You have bloody or black stools or stools that look like tar.  You have a severe headache, a stiff neck, or both.  You have a rash.  You have severe pain, cramping, or bloating in your abdomen.  You have trouble breathing or you are breathing very quickly.  Your heart is beating very quickly.  Your skin feels cold and clammy.  You feel confused.  You have pain when you urinate.  You have signs of dehydration, such as: ? Dark urine, very little urine, or no urine. ? Cracked lips. ? Dry mouth. ? Sunken eyes. ? Sleepiness. ? Weakness. These symptoms may represent a serious problem that is an emergency. Do not wait to see if the symptoms will go away. Get medical help right away. Call your local emergency services (911 in the [place] Do not drive yourself to the hospital. This information is not intended to replace advice given to you by your health care provider. Make sure you discuss any questions you   have with your health care provider. Document Released: 05/20/2005 Document Revised: 10/23/2015 Document Reviewed: 01/24/2015 Elsevier Interactive Patient Education  2018 [employer].  Influenza, Adult Influenza, more commonly known as "the flu," is a viral infection that primarily affects the respiratory tract. The respiratory tract includes organs that help you breathe, such as the lungs, nose, and throat. The flu causes many common cold symptoms, as well as a high fever and body aches. The flu spreads easily  from person to person (is contagious). Getting a flu shot (influenza vaccination) every year is the best way to prevent influenza. What are the causes? Influenza is caused by a virus. You can catch the virus by:  Breathing in droplets from an infected person's cough or sneeze.  Touching something that was recently contaminated with the virus and then touching your mouth, nose, or eyes.  What increases the risk? The following factors may make you more likely to get the flu:  Not cleaning your hands frequently with soap and water or alcohol-based hand sanitizer.  Having close contact with many people during cold and flu season.  Touching your mouth, eyes, or nose without washing or sanitizing your hands first.  Not drinking enough fluids or not eating a healthy diet.  Not getting enough sleep or exercise.  Being under a high amount of stress.  Not getting a yearly (annual) flu shot.  You may be at a higher risk of complications from the flu, such as a severe lung infection (pneumonia), if you:  Are over the age of 74.  Are pregnant.  Have a weakened disease-fighting system (immune system). You may have a weakened immune system if you: ? Have HIV or AIDS. ? Are undergoing chemotherapy. ? Aretaking medicines that reduce the activity of (suppress) the immune system.  Have a long-term (chronic) illness, such as heart disease, kidney disease, diabetes, or lung disease.  Have a liver disorder.  Are obese.  Have anemia.  What are the signs or symptoms? Symptoms of this condition typically last 4-10 days and may include:  Fever.  Chills.  Headache, body aches, or muscle aches.  Sore throat.  Cough.  Runny or congested nose.  Chest discomfort and cough.  Poor appetite.  Weakness or tiredness (fatigue).  Dizziness.  Nausea or vomiting.  How is this diagnosed? This condition may be diagnosed based on your medical history and a physical exam. Your health care  provider may do a nose or throat swab test to confirm the diagnosis. How is this treated? If influenza is detected early, you can be treated with antiviral medicine that can reduce the length of your illness and the severity of your symptoms. This medicine may be given by mouth (orally) or through an IV tube that is inserted in one of your veins. The goal of treatment is to relieve symptoms by taking care of yourself at home. This may include taking over-the-counter medicines, drinking plenty of fluids, and adding humidity to the air in your home. In some cases, influenza goes away on its own. Severe influenza or complications from influenza may be treated in a hospital. Follow these instructions at home:  Take over-the-counter and prescription medicines only as told by your health care provider.  Use a cool mist humidifier to add humidity to the air in your home. This can make breathing easier.  Rest as needed.  Drink enough fluid to keep your urine clear or pale yellow.  Cover your mouth and nose when you cough or sneeze.  Wash your hands with soap and water often, especially after you cough or sneeze. If soap and water are not available, use hand sanitizer.  Stay home from work or school as told by your health care provider. Unless you are visiting your health care provider, try to avoid leaving home until your fever has been gone for 24 hours without the use of medicine.  Keep all follow-up visits as told by your health care provider. This is important. How is this prevented?  Getting an annual flu shot is the best way to avoid getting the flu. You may get the flu shot in late summer, fall, or winter. Ask your health care provider when you should get your flu shot.  Wash your hands often or use hand sanitizer often.  Avoid contact with people who are sick during cold and flu season.  Eat a healthy diet, drink plenty of fluids, get enough sleep, and exercise regularly. Contact a  health care provider if:  You develop new symptoms.  You have: ? Chest pain. ? Diarrhea. ? A fever.  Your cough gets worse.  You produce more mucus.  You feel nauseous or you vomit. Get help right away if:  You develop shortness of breath or difficulty breathing.  Your skin or nails turn a bluish color.  You have severe pain or stiffness in your neck.  You develop a sudden headache or sudden pain in your face or ear.  You cannot stop vomiting. This information is not intended to replace advice given to you by your health care provider. Make sure you discuss any questions you have with your health care provider. Document Released: 05/17/2000 Document Revised: 10/26/2015 Document Reviewed: 03/14/2015 Elsevier Interactive Patient Education  2017 [employer] Choices to Help Relieve Diarrhea, Adult When you have diarrhea, the foods you eat and your eating habits are very important. Choosing the right foods and drinks can help:  Relieve diarrhea.  Replace lost fluids and nutrients.  Prevent dehydration.  What general guidelines should I follow? Relieving diarrhea  Choose foods with less than 2 g or .07 oz. of fiber per serving.  Limit fats to less than 8 tsp (38 g or 1.34 oz.) a day.  Avoid the following: ? Foods and beverages sweetened with high-fructose corn syrup, honey, or sugar alcohols such as xylitol, sorbitol, and mannitol. ? Foods that contain a lot of fat or sugar. ? Fried, greasy, or spicy foods. ? High-fiber grains, breads, and cereals. ? Raw fruits and vegetables.  Eat foods that are rich in probiotics. These foods include dairy products such as yogurt and fermented milk products. They help increase healthy bacteria in the stomach and intestines (gastrointestinal tract, or GI tract).  If you have lactose intolerance, avoid dairy products. These may make your diarrhea worse.  Take medicine to help stop diarrhea (antidiarrheal medicine) only as  told by your health care provider. Replacing nutrients  Eat small meals or snacks every 3-4 hours.  Eat bland foods, such as white rice, toast, or baked potato, until your diarrhea starts to get better. Gradually reintroduce nutrient-rich foods as tolerated or as told by your health care provider. This includes: ? Well-cooked protein foods. ? Peeled, seeded, and soft-cooked fruits and vegetables. ? Low-fat dairy products.  Take vitamin and mineral supplements as told by your health care provider. Preventing dehydration   Start by sipping water or a special solution to prevent dehydration (oral rehydration solution, ORS). Urine that is clear or pale yellow  means that you are getting enough fluid.  Try to drink at least 8-10 cups of fluid each day to help replace lost fluids.  You may add other liquids in addition to water, such as clear juice or decaffeinated sports drinks, as tolerated or as told by your health care provider.  Avoid drinks with caffeine, such as coffee, tea, or soft drinks.  Avoid alcohol. What foods are recommended? The items listed may not be a complete list. Talk with your health care provider about what dietary choices are best for you. Grains White rice. White, [place], or pita breads (fresh or toasted), including plain rolls, buns, or bagels. White pasta. Saltine, soda, or graham crackers. Pretzels. Low-fiber cereal. Cooked cereals made with water (such as cornmeal, farina, or cream cereals). Plain muffins. Matzo. Melba toast. Zwieback. Vegetables Potatoes (without the skin). Most well-cooked and canned vegetables without skins or seeds. Tender lettuce. Fruits Apple sauce. Fruits canned in juice. Cooked apricots, cherries, grapefruit, peaches, pears, or plums. Fresh bananas and cantaloupe. Meats and other protein foods Baked or boiled chicken. Eggs. Tofu. Fish. Seafood. Smooth nut butters. Ground or well-cooked tender beef, ham, veal, lamb, pork, or  poultry. Dairy Plain yogurt, kefir, and unsweetened liquid yogurt. Lactose-free milk, buttermilk, skim milk, or soy milk. Low-fat or nonfat hard cheese. Beverages Water. Low-calorie sports drinks. Fruit juices without pulp. Strained tomato and vegetable juices. Decaffeinated teas. Sugar-free beverages not sweetened with sugar alcohols. Oral rehydration solutions, if approved by your health care provider. Seasoning and other foods Bouillon, broth, or soups made from recommended foods. What foods are not recommended? The items listed may not be a complete list. Talk with your health care provider about what dietary choices are best for you. Grains Whole grain, whole wheat, bran, or rye breads, rolls, pastas, and crackers. Wild or brown rice. Whole grain or bran cereals. Barley. Oats and oatmeal. Corn tortillas or taco shells. Granola. Popcorn. Vegetables Raw vegetables. Fried vegetables. Cabbage, broccoli, Brussels sprouts, artichokes, baked beans, beet greens, corn, kale, legumes, peas, sweet potatoes, and yams. Potato skins. Cooked spinach and cabbage. Fruits Dried fruit, including raisins and dates. Raw fruits. Stewed or dried prunes. Canned fruits with syrup. Meat and other protein foods Fried or fatty meats. Deli meats. Chunky nut butters. Nuts and seeds. Beans and lentils. Berniece Salines. Hot dogs. Sausage. Dairy High-fat cheeses. Whole milk, chocolate milk, and beverages made with milk, such as milk shakes. Half-and-half. Cream. sour cream. Ice cream. Beverages Caffeinated beverages (such as coffee, tea, soda, or energy drinks). Alcoholic beverages. Fruit juices with pulp. Prune juice. Soft drinks sweetened with high-fructose corn syrup or sugar alcohols. High-calorie sports drinks. Fats and oils Butter. Cream sauces. Margarine. Salad oils. Plain salad dressings. Olives. Avocados. Mayonnaise. Sweets and desserts Sweet rolls, doughnuts, and sweet breads. Sugar-free desserts sweetened with sugar  alcohols such as xylitol and sorbitol. Seasoning and other foods Honey. Hot sauce. Chili powder. Gravy. Cream-based or milk-based soups. Pancakes and waffles. Summary  When you have diarrhea, the foods you eat and your eating habits are very important.  Make sure you get at least 8-10 cups of fluid each day, or enough to keep your urine clear or pale yellow.  Eat bland foods and gradually reintroduce healthy, nutrient-rich foods as tolerated, or as told by your health care provider.  Avoid high-fiber, fried, greasy, or spicy foods. This information is not intended to replace advice given to you by your health care provider. Make sure you discuss any questions you have with your health  care provider. Document Released: 08/10/2003 Document Revised: 05/17/2016 Document Reviewed: 05/17/2016 Elsevier Interactive Patient Education  [employer].

## 2017-07-04 NOTE — Progress Notes (Signed)
Error

## 2017-07-04 NOTE — Progress Notes (Signed)
Subjective:  Sue [name] is a 57 y.o. female who presents for evaluation of influenza like symptoms.  Symptoms include bilateral ear pressure/pain, achiness, chest pain during cough, chills, facial pain, fever: low grade fevers, nasal congestion, productive cough with clear colored sputum, sinus pressure, sore throat and swollen glands.  Onset of symptoms was 3 days ago, and has been gradually worsening since that time.  Patient also c/o nausea and states she has not been able to keep any foods or liquids down.  Patient unable to take maintenance medications. Treatment to date:  none.  High risk factors for influenza complications:  none.  The following portions of the patient's history were reviewed and updated as appropriate:  allergies, current medications and past medical history.  Constitutional: positive for anorexia, chills, fatigue, fevers, malaise and sweats, negative for night sweats and weight loss Eyes: negative Ears, nose, mouth, throat, and face: positive for nasal congestion, sore throat and bilateral ear pressure, negative for ear drainage, hearing loss and sore mouth Respiratory: positive for cough, pleurisy/chest pain, sputum and wheezing, negative for asthma, hemoptysis and stridor Cardiovascular: negative Gastrointestinal: positive for diarrhea, nausea and vomiting, negative for abdominal pain and melena Neurological: positive for headaches, negative for dizziness, vertigo and weakness Behavioral/Psych: negative Objective:  BP 124/76 (BP Location: Right Arm, Patient Position: Sitting, Cuff Size: Normal)   Pulse 93   Temp 99.4 F (37.4 C) (Oral)   Resp 17   Wt 156 lb 12.8 oz (71.1 kg)   LMP 10/30/2012   SpO2 97%   BMI 28.68 kg/m  General appearance: alert, cooperative, fatigued and no distress Head: Normocephalic, without obvious abnormality, atraumatic Eyes: conjunctivae/corneas clear. PERRL, EOM's intact. Fundi benign. Ears: normal TM's and external ear canals both  ears Nose: no discharge, turbinates swollen, inflamed, mild maxillary sinus tenderness bilateral, mild frontal sinus tenderness bilateral Throat: lips, mucosa, and tongue normal; teeth and gums normal Lungs: clear to auscultation bilaterally Heart: regular rate and rhythm, S1, S2 normal, no murmur, click, rub or gallop Abdomen: soft, non-tender; bowel sounds normal; no masses,  no organomegaly Pulses: 2+ and symmetric Skin: Skin color, texture, turgor normal. No rashes or lesions Lymph nodes: cervical lymphadenopathy Neurologic: Grossly normal    Assessment:  influenza like syndrome and Nausea with Vomiting    Plan:  Discussed diagnosis and treatment of influenza. Educational material distributed and questions answered. Suggested symptomatic OTC remedies. Supportive care with appropriate antipyretics and fluids. Tessalon Perles and Promethazine per orders. Follow up as needed. Discussed with patient the importance of staying hydrated.  Patient encouraged to start with ice chips, BRAT diet, then progress to regular diet as tolerated.  Patient verbalized understanding.

## 2017-07-06 ENCOUNTER — Telehealth: Payer: Self-pay | Admitting: Nurse Practitioner

## 2017-07-06 NOTE — Telephone Encounter (Signed)
Called patient to follow up on her status.  Reached voicemail, left message asking patient to return the call.

## 2017-07-07 ENCOUNTER — Other Ambulatory Visit: Payer: Self-pay | Admitting: Internal Medicine

## 2017-07-07 MED FILL — NexIUM 40 MG CPDR: 40 | 30 days supply | Qty: 30 | Fill #0

## 2017-07-08 MED FILL — METHYLPREDNISOLONE 4 MG TAB: 4 | 6 days supply | Qty: 21 | Fill #0

## 2017-07-08 MED FILL — clonazePAM 1 MG TABS: 1 | 30 days supply | Qty: 30 | Fill #0

## 2017-07-08 MED FILL — AZITHROMYCIN 250 MG TABLET: 250 | 5 days supply | Qty: 6 | Fill #0

## 2017-07-21 MED FILL — SERTRALINE HCL 50 MG TABLET: 50 | 90 days supply | Qty: 90 | Fill #1

## 2017-08-04 MED FILL — NexIUM 40 MG CPDR: 40 | 30 days supply | Qty: 30 | Fill #1

## 2017-08-12 MED FILL — clonazePAM 1 MG TABS: 1 | 30 days supply | Qty: 30 | Fill #1

## 2017-08-26 MED FILL — LOSARTAN-HCTZ 100-25 MG TAB: 100-25 | 90 days supply | Qty: 90 | Fill #1

## 2017-09-04 MED FILL — LEVOTHYROXINE 25 MCG TABLET: 25 | 90 days supply | Qty: 90 | Fill #0

## 2017-09-04 MED FILL — NexIUM 40 MG CPDR: 40 | 30 days supply | Qty: 30 | Fill #0

## 2017-09-15 MED FILL — clonazePAM 1 MG TABS: 1 | 30 days supply | Qty: 30 | Fill #2

## 2017-10-06 MED FILL — NexIUM 40 MG CPDR: 40 | 30 days supply | Qty: 30 | Fill #1

## 2017-10-17 MED FILL — AMOXICILLIN 875 MG TABLET: 875 | 7 days supply | Qty: 14 | Fill #0

## 2017-10-17 MED FILL — clonazePAM 1 MG TABS: 1 | 30 days supply | Qty: 30 | Fill #3

## 2017-10-29 MED FILL — SERTRALINE HCL 50 MG TABLET: 50 | 90 days supply | Qty: 90 | Fill #0

## 2017-11-03 MED FILL — NexIUM 40 MG CPDR: 40 | 30 days supply | Qty: 30 | Fill #2

## 2017-11-13 ENCOUNTER — Encounter: Payer: Self-pay | Admitting: Podiatry

## 2017-11-13 ENCOUNTER — Ambulatory Visit: Payer: No Typology Code available for payment source | Admitting: Podiatry

## 2017-11-13 DIAGNOSIS — S92424A Nondisplaced fracture of distal phalanx of right great toe, initial encounter for closed fracture: Secondary | ICD-10-CM | POA: Diagnosis not present

## 2017-11-18 DIAGNOSIS — S92424A Nondisplaced fracture of distal phalanx of right great toe, initial encounter for closed fracture: Secondary | ICD-10-CM | POA: Insufficient documentation

## 2017-11-18 NOTE — Progress Notes (Signed)
Subjective:   Patient ID: Sue [name], female   DOB: 57 y.o.   MRN: [id number]   HPI 57 year old female presents the office today for concerns of pain in the right big toe.  The toes been swollen as well as bruised.  She says this happened after she hit her toe on the wall, door about a week ago.  She went to her primary care physician and x-rays performed which did reveal a fracture.  She presents today for follow-up.   Review of Systems  All other systems reviewed and are negative.  Past Medical History:  Diagnosis Date  . Anxiety   . Hypertension   . Hypothyroid     Past Surgical History:  Procedure Laterality Date  . TONSILLECTOMY  3rd grade  . UPPER ESOPHAGEAL ENDOSCOPIC ULTRASOUND (EUS) N/A 02/25/2017   Procedure: UPPER ESOPHAGEAL ENDOSCOPIC ULTRASOUND (EUS);  Surgeon: Carol Ada, MD;  Location: [facility];  Service: Endoscopy;  Laterality: N/A;     Current Outpatient Medications:  .  benzonatate (TESSALON) 100 MG capsule, Take 1 capsule (100 mg total) by mouth 2 (two) times daily as needed for cough., Disp: 20 capsule, Rfl: 0 .  clonazePAM (KLONOPIN) 1 MG tablet, TAKE 1 TABLET BY MOUTH AT BEDTIME IF NEEDED, Disp: 30 tablet, Rfl: 0 .  DEXILANT 60 MG capsule, Take 60 mg by mouth daily., Disp: , Rfl: 1 .  FLUoxetine (PROZAC) 40 MG capsule, Take 40 mg by mouth daily., Disp: , Rfl: 1 .  levothyroxine (SYNTHROID, LEVOTHROID) 25 MCG tablet, Take 25 mcg by mouth daily before breakfast., Disp: , Rfl: 1 .  losartan-hydrochlorothiazide (HYZAAR) 100-12.5 MG tablet, Take 1 tablet by mouth daily., Disp: , Rfl: 1 .  losartan-hydrochlorothiazide (HYZAAR) 100-25 MG tablet, Take 1 tablet daily by mouth., Disp: , Rfl: 1 .  Multiple Vitamin (MULTIVITAMIN WITH MINERALS) TABS tablet, Take 1 tablet by mouth daily., Disp: , Rfl:  .  NEXIUM 40 MG capsule, Take 40 mg 2 (two) times daily by mouth., Disp: , Rfl: 0 .  promethazine (PHENERGAN) 25 MG tablet, Take 1 tablet (25 mg total) by mouth  every 8 (eight) hours as needed for up to 5 days for nausea or vomiting., Disp: 15 tablet, Rfl: 0 .  sertraline (ZOLOFT) 25 MG tablet, Take 25 mg daily by mouth., Disp: , Rfl:   Allergies  Allergen Reactions  . Oxycodone Itching          Objective:  Physical Exam  General: AAO x3, NAD  Dermatological: There is edema and ecchymosis to the right distal phalanx.  There is no significant subungual hematoma present.  No open sores identified at this time.  Vascular: Dorsalis Pedis artery and Posterior Tibial artery pedal pulses are 2/4 bilateral with immedate capillary fill time. Pedal hair growth present. No varicosities and no lower extremity edema present bilateral. There is no pain with calf compression, swelling, warmth, erythema.   Neruologic: Grossly intact via light touch bilateral. Protective threshold with Semmes Wienstein monofilament intact to all pedal sites bilateral.   Musculoskeletal: Tenderness the distal phalanx of the right side.  No pain in the metatarsal other areas of the foot.  Edema loss of the hallux.  No other areas of edema, erythema.  Muscular strength 5/5 in all groups tested bilateral.  Gait: Unassisted, Nonantalgic.       Assessment:   Right distal phalanx nondisplaced fracture of the hallux    Plan:  -Treatment options discussed including all alternatives, risks, and complications -Etiology of symptoms  were discussed -Reviewed the x-rays from primary care physician. Nondisplaced fracture to the distal phalanx.  I also showed her the x-rays.  Given the pain in the big toe as well as going into the IPJ recommend immobilization in a surgical shoe which was dispensed today.  Ice elevation.  Anti-inflammatories as needed.  *X-ray next appointment  Return in about 1 month (around 12/11/2017).  Trula Slade DPM

## 2017-11-19 MED FILL — clonazePAM 1 MG TABS: 1 | 30 days supply | Qty: 30 | Fill #4

## 2017-11-24 MED FILL — OMEPRAZOLE 20 MG CAP: 20 | 30 days supply | Qty: 60 | Fill #0

## 2017-12-05 MED FILL — LOSARTAN-HCTZ 100-25 MG TAB: 100-25 | 90 days supply | Qty: 90 | Fill #0

## 2017-12-15 ENCOUNTER — Ambulatory Visit: Payer: No Typology Code available for payment source | Admitting: Podiatry

## 2017-12-24 MED FILL — LEVOTHYROXINE 25 MCG TABLET: 25 | 90 days supply | Qty: 90 | Fill #0

## 2017-12-25 MED FILL — clonazePAM 1 MG TABS: 1 | 30 days supply | Qty: 30 | Fill #5

## 2018-01-02 MED FILL — traMADol HCL 50 MG TABS: 50 | 6 days supply | Qty: 45 | Fill #0

## 2018-01-02 MED FILL — OMEPRAZOLE 20 MG CAP: 20 | 30 days supply | Qty: 60 | Fill #0

## 2018-01-05 MED FILL — CYCLOBENZAPRINE 5 MG TABLET: 5 | 15 days supply | Qty: 30 | Fill #0

## 2018-01-19 MED FILL — traMADol HCL 50 MG TABS: 50 | 7 days supply | Qty: 45 | Fill #0

## 2018-01-19 MED FILL — BACLOFEN 10 MG TABLET: 10 | 30 days supply | Qty: 90 | Fill #0

## 2018-02-03 ENCOUNTER — Other Ambulatory Visit: Payer: Self-pay | Admitting: Internal Medicine

## 2018-02-03 DIAGNOSIS — Z1231 Encounter for screening mammogram for malignant neoplasm of breast: Secondary | ICD-10-CM

## 2018-02-09 MED FILL — SERTRALINE HCL 50 MG TABLET: 50 | 90 days supply | Qty: 90 | Fill #1

## 2018-02-09 MED FILL — OMEPRAZOLE 20 MG CPDR: 20 | 30 days supply | Qty: 60 | Fill #1

## 2018-02-13 MED FILL — clonazePAM 1 MG TABS: 1 | 30 days supply | Qty: 30 | Fill #0

## 2018-02-18 ENCOUNTER — Ambulatory Visit (INDEPENDENT_AMBULATORY_CARE_PROVIDER_SITE_OTHER): Payer: Self-pay | Admitting: Family Medicine

## 2018-02-18 VITALS — BP 110/72 | HR 78 | Temp 97.8°F | Resp 16 | Wt 153.6 lb

## 2018-02-18 DIAGNOSIS — J3489 Other specified disorders of nose and nasal sinuses: Secondary | ICD-10-CM

## 2018-02-18 DIAGNOSIS — B009 Herpesviral infection, unspecified: Secondary | ICD-10-CM

## 2018-02-18 DIAGNOSIS — L0100 Impetigo, unspecified: Secondary | ICD-10-CM

## 2018-02-18 MED ORDER — MUPIROCIN 2 % EX OINT: 1.0000 "application " | TOPICAL_OINTMENT | Freq: Two times a day (BID) | CUTANEOUS | 0 refills | Status: AC

## 2018-02-18 MED ORDER — VALACYCLOVIR HCL 1 G PO TABS: 2000.0000 mg | ORAL_TABLET | Freq: Two times a day (BID) | ORAL | 0 refills | Status: AC

## 2018-02-18 MED FILL — valACYclovir HCL 1 GM TABS: 1 | 1 days supply | Qty: 4 | Fill #0

## 2018-02-18 MED FILL — MUPIROCIN 2% OINTMENT: 2 | 5 days supply | Qty: 22 | Fill #0

## 2018-02-18 NOTE — Progress Notes (Signed)
Sue [name] is a 57 y.o. female who presents today with concerns of a painful sore on the inside of her left nostril. She reports that it has been present for the last 3 days. She reports that this condition is not under the care of her PCP and that she has only ever been treated one time in the last 8 months for this condition. She does report a history of shingles left side exacerbations.  Review of Systems  Constitutional: Negative for chills, fever and malaise/fatigue.  HENT: Negative for congestion, ear discharge, ear pain, sinus pain and sore throat.   Eyes: Negative.   Respiratory: Negative for cough, sputum production and shortness of breath.   Cardiovascular: Negative.  Negative for chest pain.  Gastrointestinal: Negative for abdominal pain, diarrhea, nausea and vomiting.  Genitourinary: Negative for dysuria, frequency, hematuria and urgency.  Musculoskeletal: Negative for myalgias.  Skin: Negative.   Neurological: Negative for headaches.  Endo/Heme/Allergies: Negative.   Psychiatric/Behavioral: Negative.     O: Vitals:   02/18/18 1255  BP: 110/72  Pulse: 78  Resp: 16  Temp: 97.8 F (36.6 C)  SpO2: 97%     Physical Exam  Constitutional: She is oriented to person, place, and time. Vital signs are normal. She appears well-developed and well-nourished. She is active.  Non-toxic appearance. She does not have a sickly appearance.  HENT:  Head: Normocephalic.  Right Ear: Hearing, tympanic membrane, external ear and ear canal normal.  Left Ear: Hearing, tympanic membrane, external ear and ear canal normal.  Nose: Mucosal edema present.    Mouth/Throat: Uvula is midline and oropharynx is clear and moist.  Left nostril along anterior medial septum small crusty area of erythema- some evidence of impetigo- unable to rule out herpetic exacerbation- patient reports sharp pain when palpated. No discharge noted  Neck: Normal range of motion. Neck supple.  Cardiovascular: Normal  rate, regular rhythm, normal heart sounds and normal pulses.  Pulmonary/Chest: Effort normal and breath sounds normal.  Abdominal: Soft. Bowel sounds are normal.  Musculoskeletal: Normal range of motion.  Lymphadenopathy:       Head (right side): No submental and no submandibular adenopathy present.       Head (left side): No submental and no submandibular adenopathy present.    She has no cervical adenopathy.  Neurological: She is alert and oriented to person, place, and time.  Psychiatric: She has a normal mood and affect.  Vitals reviewed.  A: 1. Nasal sore   2. Impetigo   3. Herpes simplex infection    P: Discussed exam findings, diagnosis etiology and medication use and indications reviewed with patient. Follow- Up and discharge instructions provided. No emergent/urgent issues found on exam.  Patient verbalized understanding of information provided and agrees with plan of care (POC), all questions answered.  1. Nasal sore - mupirocin ointment (BACTROBAN) 2 %; Place 1 application into the nose 2 (two) times daily for 5 days.  Suspect associated with herpes virus will treat as cold sore and follow up with patient in 48 hours to assess resolution  2. Impetigo - mupirocin ointment (BACTROBAN) 2 %; Place 1 application into the nose 2 (two) times daily for 5 days.  3. Herpes simplex infection - valACYclovir (VALTREX) 1000 MG tablet; Take 2 tablets (2,000 mg total) by mouth 2 (two) times daily for 1 day.  Other orders - Multiple Vitamin (MULTIVITAMIN) capsule; Take by mouth. - losartan (COZAAR) 25 MG tablet; Take by mouth.

## 2018-02-18 NOTE — Patient Instructions (Signed)
Cold Sore A cold sore, also called a fever blister, is a skin infection that causes small, fluid-filled sores to form inside of the mouth or on the lips, gums, nose, chin, or cheeks. Cold sores can spread to other parts of the body, such as the eyes or fingers. In some people with other medical conditions, cold sores can spread to multiple other body sites, including the genitals. Cold sores can be spread or passed from person to person (contagious) until the sores crust over completely. What are the causes? Cold sores are caused by the herpes simplex virus (HSV-1). HSV-1 is closely related to the virus that causes genital herpes (HSV-2), but these viruses are not the same. Once a person is infected with HSV-1, the virus remains permanently in the body. HSV-1 is spread from person to person through close contact, such as through kissing, touching the affected area, or sharing personal items such as lip balm, razors, or eating utensils. What increases the risk? A cold sore outbreak is more likely to develop in people who:  Are tired, stressed, or sick.  Are menstruating.  Are pregnant.  Take certain medicines.  Are exposed to cold weather or too much sun.  What are the signs or symptoms? Symptoms of a cold sore outbreak often go through different stages. Here is how a cold sore develops:  Tingling, itching, or burning is felt 1-2 days before the outbreak.  Fluid-filled blisters appear on the lips, inside the mouth, on the nose, or on the cheeks.  The blisters start to ooze clear fluid.  The blisters dry up and a yellow crust appears in its place.  The crust falls off.  Other symptoms include:  Fever.  Sore throat.  Headache.  Muscle aches.  Swollen neck glands.  You also may not have any symptoms. How is this diagnosed? This condition is often diagnosed based on your medical history and a physical exam. Your health care provider may swab your sore and then examine it in  the lab. Rarely, blood tests may be done to check for HSV-1. How is this treated? There is no cure for cold sores or HSV-1. There also is no vaccine for HSV-1. Most cold sores go away on their own without treatment within two weeks. Medicines cannot make the infection go away, but medicines can:  Help relieve some of the pain associated with the sores.  Work to stop the virus from multiplying.  Shorten healing time.  Medicines may be in the form of creams, gels, pills, or a shot. Follow these instructions at home: Medicines  Take or apply over-the-counter and prescription medicines only as told by your health care provider.  Use a cotton-tip swab to apply creams or gels to your sores. Sore Care  Do not touch the sores or pick the scabs.  Wash your hands often. Do not touch your eyes without washing your hands first.  Keep the sores clean and dry.  If directed, apply ice to the sores: ? Put ice in a plastic bag. ? Place a towel between your skin and the bag. ? Leave the ice on for 20 minutes, 2-3 times per day. Lifestyle  Do not kiss, have oral sex, or share personal items until your sores heal.  Eat a soft, bland diet. Avoid eating hot, cold, or salty foods. These can hurt your mouth.  Use a straw if it hurts to drink out of a glass.  Avoid the sun and limit your stress if these things   trigger outbreaks. If sun causes cold sores, apply sunscreen on your lips before being out in the sun. Contact a health care provider if:  You have symptoms for more than two weeks.  You have pus coming from the sores.  You have redness that is spreading.  You have pain or irritation in your eye.  You get sores on your genitals.  Your sores do not heal within two weeks.  You have frequent cold sore outbreaks. Get help right away if:  You have a fever and your symptoms suddenly get worse.  You have a headache and confusion. This information is not intended to replace advice  given to you by your health care provider. Make sure you discuss any questions you have with your health care provider. Document Released: 05/17/2000 Document Revised: 01/12/2016 Document Reviewed: 03/10/2015 Elsevier Interactive Patient Education  2018 [employer] Inc[employer]  

## 2018-03-02 ENCOUNTER — Ambulatory Visit
Admission: RE | Admit: 2018-03-02 | Discharge: 2018-03-02 | Disposition: A | Discharge status: Home / Self Care | Payer: No Typology Code available for payment source | Source: Ambulatory Visit | Attending: Internal Medicine | Admitting: Internal Medicine

## 2018-03-02 DIAGNOSIS — Z1231 Encounter for screening mammogram for malignant neoplasm of breast: Secondary | ICD-10-CM

## 2018-03-10 MED FILL — LOSARTAN-HCTZ 100-25 MG TAB: 100-25 | 90 days supply | Qty: 90 | Fill #1

## 2018-03-16 MED FILL — OMEPRAZOLE 20 MG CPDR: 20 | 30 days supply | Qty: 60 | Fill #2

## 2018-03-17 MED FILL — clonazePAM 1 MG TABS: 1 | 30 days supply | Qty: 30 | Fill #1

## 2018-03-26 MED FILL — LEVOTHYROXINE 25 MCG TABLET: 25 | 90 days supply | Qty: 90 | Fill #1

## 2018-04-10 ENCOUNTER — Encounter (INDEPENDENT_AMBULATORY_CARE_PROVIDER_SITE_OTHER): Payer: Self-pay | Admitting: Orthopaedic Surgery

## 2018-04-10 ENCOUNTER — Ambulatory Visit (INDEPENDENT_AMBULATORY_CARE_PROVIDER_SITE_OTHER): Payer: No Typology Code available for payment source | Admitting: Orthopaedic Surgery

## 2018-04-10 ENCOUNTER — Ambulatory Visit (INDEPENDENT_AMBULATORY_CARE_PROVIDER_SITE_OTHER): Payer: No Typology Code available for payment source

## 2018-04-10 VITALS — BP 140/94 | HR 69 | Ht 62.0 in | Wt 150.0 lb

## 2018-04-10 DIAGNOSIS — G8929 Other chronic pain: Secondary | ICD-10-CM

## 2018-04-10 DIAGNOSIS — M25511 Pain in right shoulder: Secondary | ICD-10-CM | POA: Diagnosis not present

## 2018-04-10 MED ORDER — METHYLPREDNISOLONE ACETATE 40 MG/ML IJ SUSP
80.0000 mg | INTRAMUSCULAR | Status: AC | PRN
  Administered 2018-04-10: 80 mg

## 2018-04-10 MED ORDER — LIDOCAINE HCL 2 % IJ SOLN
2.0000 mL | INTRAMUSCULAR | Status: AC | PRN
  Administered 2018-04-10: 2 mL

## 2018-04-10 MED ORDER — BUPIVACAINE HCL 0.5 % IJ SOLN
2.0000 mL | INTRAMUSCULAR | Status: AC | PRN
  Administered 2018-04-10: 2 mL via INTRA_ARTICULAR

## 2018-04-10 NOTE — Progress Notes (Signed)
Office Visit Note   Patient: Sue [name]           Date of Birth: [birth year]           MRN: [id number] Visit Date: 04/10/2018              Requested by: Thressa Sheller, [address], [address] [place], [place] [address] PCP: Thressa Sheller, MD   Assessment & Plan: Visit Diagnoses:  1. Chronic right shoulder pain     Plan: Right shoulder pain with impingement and symptoms consistent with biceps tendinitis.  Will inject the subacromial space and area around the biceps tendon.  Actually had excellent response.  We will plan to see her back as needed.  Consider MRI scan if no prolonged improvement  Follow-Up Instructions: Return if symptoms worsen or fail to improve.   Orders:  Orders Placed This Encounter  Procedures  . Large Joint Inj: R subacromial bursa  . XR Shoulder Right   No orders of the defined types were placed in this encounter.     Procedures: Large Joint Inj: R subacromial bursa on 04/10/2018 4:01 PM Indications: pain and diagnostic evaluation Details: 25 G 1.5 in needle, anterolateral approach  Arthrogram: No  Medications: 2 mL lidocaine 2 %; 2 mL bupivacaine 0.5 %; 80 mg methylPREDNISolone acetate 40 MG/ML Consent was given by the patient. Immediately prior to procedure a time out was called to verify the correct patient, procedure, equipment, support staff and site/side marked as required. Patient was prepped and draped in the usual sterile fashion.       Clinical Data: No additional findings.   Subjective: Chief Complaint  Patient presents with  . New Patient (Initial Visit)    r shoulder pain for few yrs getting worse last few weeks, tried tylenol arthiritis but it upsets stomach hard to reach arm out in front of her and above head. no injury, injections or prior surgery  Sue [name] is into the office for evaluation of right shoulder pain.  She has had some trouble off and on for "but had a recent exacerbation within the past  several weeks.  She is reached a point where she is having difficulty grabbing for a cup of coffee.  She has had difficulty sleeping on that side.  Her pain seems to be worse when reaching at eye level or above.  No acute injury.  No skin change.  No numbness or tingling or neck pain.  HPI  Review of Systems  Constitutional: Negative for fatigue and fever.  HENT: Negative for ear pain.   Eyes: Negative for pain.  Respiratory: Negative for cough and shortness of breath.   Cardiovascular: Negative for leg swelling.  Gastrointestinal: Negative for constipation and diarrhea.  Genitourinary: Negative for difficulty urinating.  Musculoskeletal: Positive for neck pain. Negative for back pain.  Skin: Negative for rash.  Allergic/Immunologic: Negative for food allergies.  Neurological: Positive for weakness and numbness.  Hematological: Does not bruise/bleed easily.  Psychiatric/Behavioral: Positive for sleep disturbance.     Objective: Vital Signs: BP (!) 140/94 (BP Location: Right Arm, Patient Position: Sitting, Cuff Size: Normal)   Pulse 69   Ht 5\' 2"  (1.575 m)   Wt 150 lb (68 kg)   LMP 10/30/2012   BMI 27.44 kg/m   Physical Exam  Constitutional: She is oriented to person, place, and time. She appears well-developed and well-nourished.  HENT:  Mouth/Throat: Oropharynx is clear and moist.  Eyes: Pupils are equal, round, and reactive  to light. EOM are normal.  Pulmonary/Chest: Effort normal.  Neurological: She is alert and oriented to person, place, and time.  Skin: Skin is warm and dry.  Psychiatric: She has a normal mood and affect. Her behavior is normal.    Ortho Exam awake alert and oriented x3.  Comfortable sitting positive speeds sign and tenderness directly over the biceps tendon.  Painful overhead arc of motion but had full flexion.  Some loss of internal rotation related to pain.  Impingement positive on the extremes of internal and external rotation.  Negative empty can  testing.  Biceps muscle intact.  No pain with range of motion of cervical spine  Specialty Comments:  No specialty comments available.  Imaging: Xr Shoulder Right  Result Date: 04/10/2018 Films of the right shoulder obtained in several projections.  No ectopic calcification.  No acute change.  Humeral head is centered about the glenoid.  Will space between the humeral head and the acromium.  AC joint with minimal degenerative change    PMFS History: Patient Active Problem List   Diagnosis Date Noted  . Closed nondisplaced fracture of distal phalanx of right great toe 11/18/2017  . Left shoulder pain 08/16/2016  . Fracture of rib of right side 01/31/2016  . Acute bronchitis 07/22/2014  . Sebaceous cyst 06/30/2014  . Hypothyroid   . Hypertension   . Anxiety    Past Medical History:  Diagnosis Date  . Anxiety   . Hypertension   . Hypothyroid     Family History  Problem Relation Age of Onset  . Uterine cancer Maternal Grandmother   . Colon cancer Unknown        Paternal Event organiser  . Breast cancer Unknown        Maternal Great Grandmother  . Hypertension Father   . Heart attack Father   . Hypertension Brother   . Lupus Mother   . Osteoporosis Mother     Past Surgical History:  Procedure Laterality Date  . TONSILLECTOMY  3rd grade  . UPPER ESOPHAGEAL ENDOSCOPIC ULTRASOUND (EUS) N/A 02/25/2017   Procedure: UPPER ESOPHAGEAL ENDOSCOPIC ULTRASOUND (EUS);  Surgeon: Carol Ada, MD;  Location: [facility];  Service: Endoscopy;  Laterality: N/A;   Social History   Occupational History  . Not on file  Tobacco Use  . Smoking status: Never Smoker  . Smokeless tobacco: Never Used  Substance and Sexual Activity  . Alcohol use: Yes    Alcohol/week: 7.0 standard drinks    Types: 7 Glasses of wine per week  . Drug use: No  . Sexual activity: Never    Partners: Male

## 2018-04-15 MED FILL — OMEPRAZOLE 20 MG CPDR: 20 | 30 days supply | Qty: 60 | Fill #3

## 2018-04-16 IMAGING — CR DG CHEST 2V
2 series · 2 of 2 positions shown · non-contrast
Comparison: 01/31/2016

CLINICAL DATA: Right-sided, lower anterior rib pain after a fall 3
weeks ago. Known fracture of the ninth rib. History of hypertension.

EXAM:
CHEST  2 VIEW

[w chest pa]
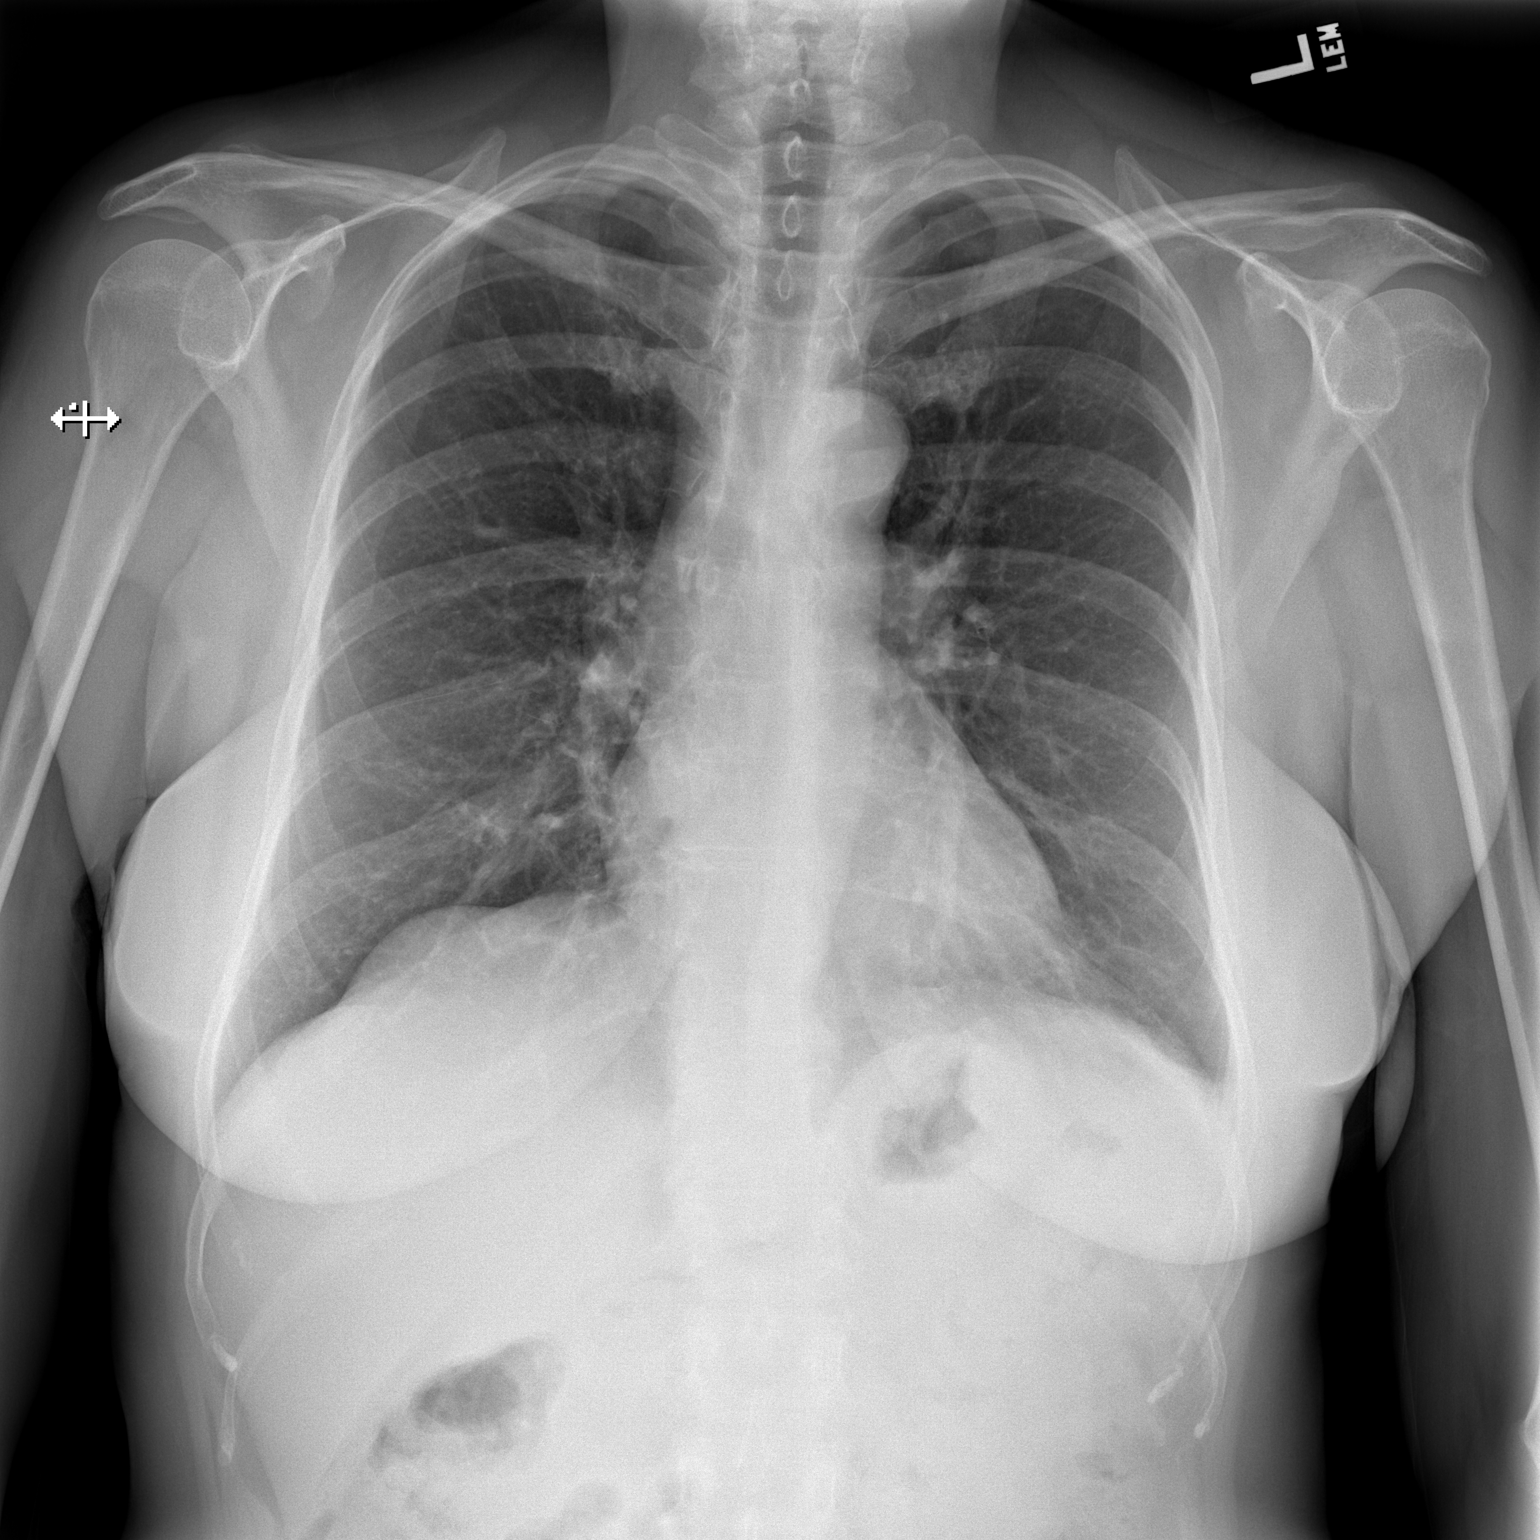

[w chest lat]
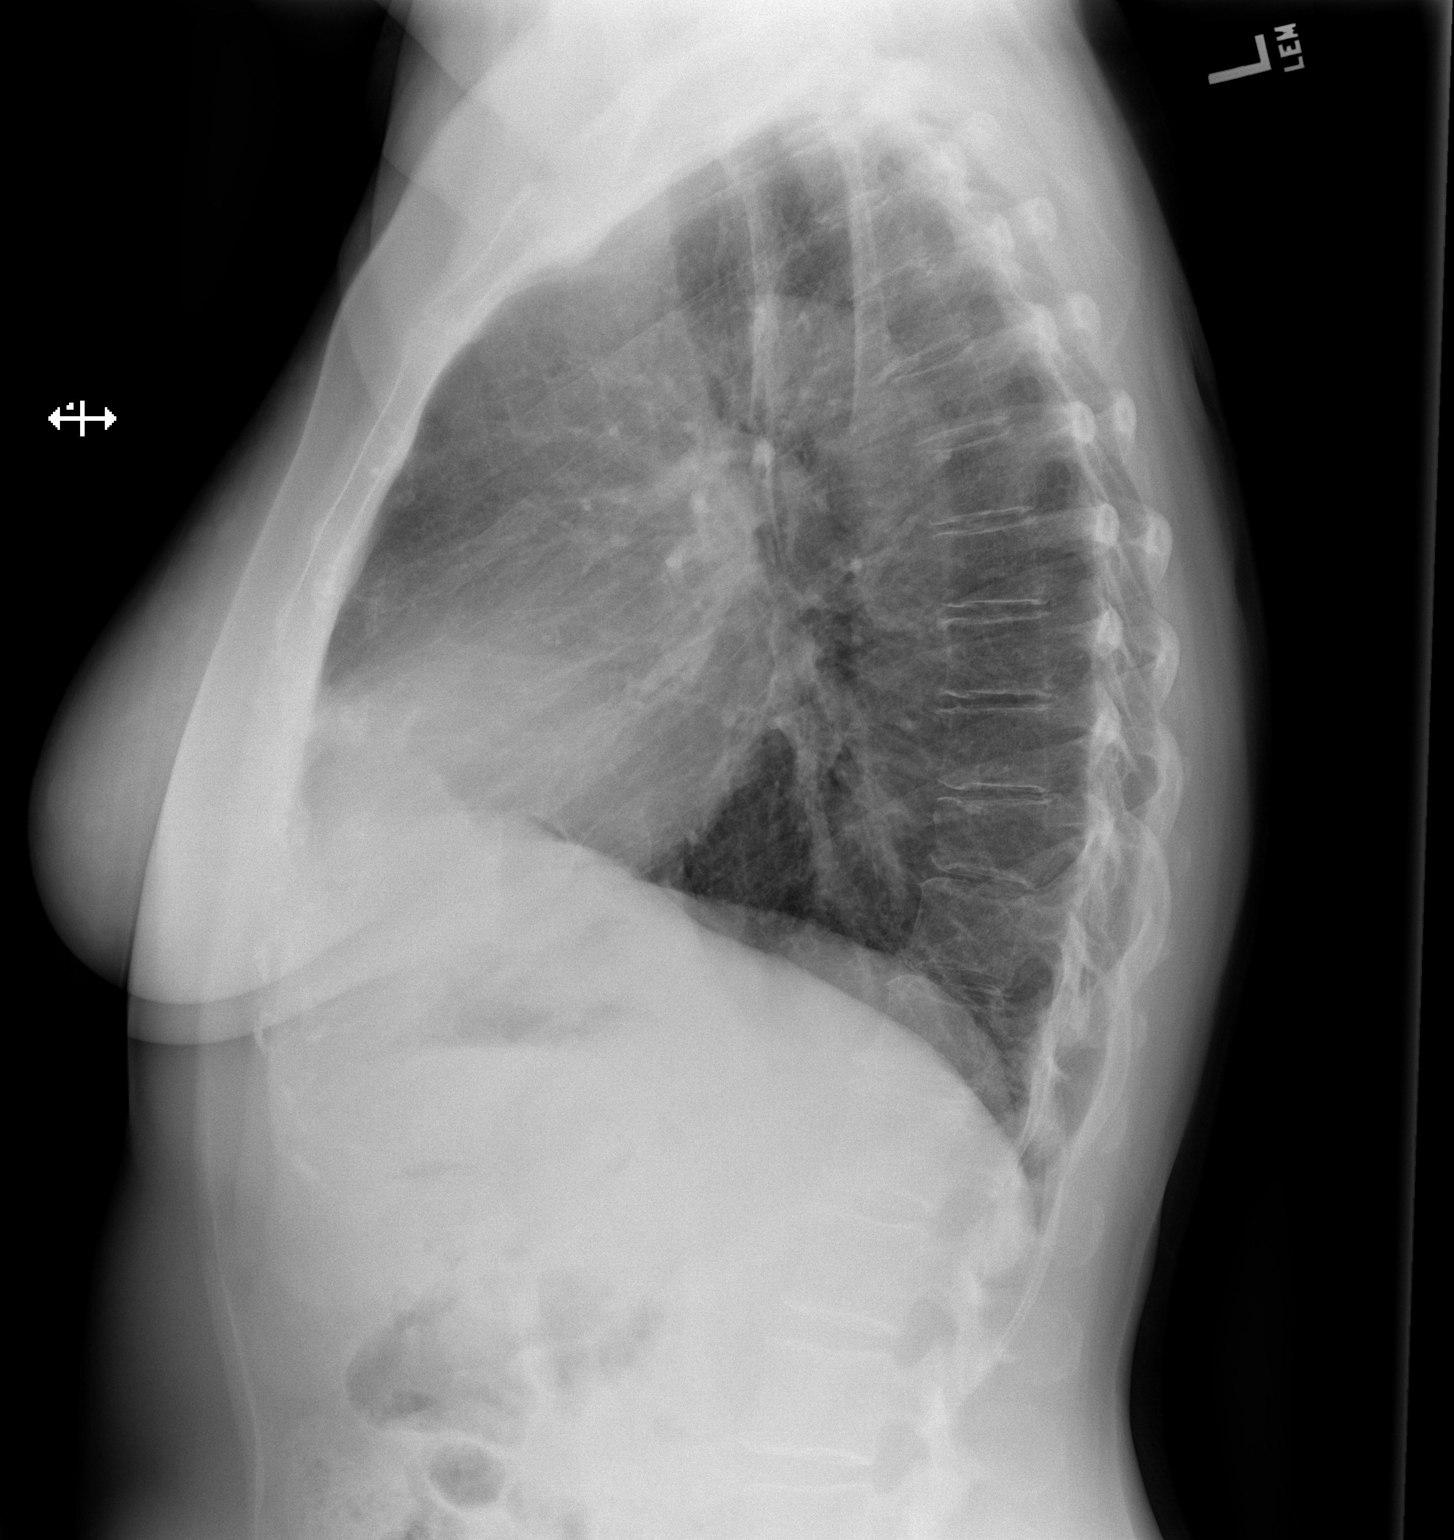

[2 of 2 positions shown; findings below may reference images not displayed]

FINDINGS: Normal heart size and pulmonary vascularity. No focal airspace
disease or consolidation in the lungs. No blunting of costophrenic
angles. No pneumothorax. Mediastinal contours appear intact.
IMPRESSION: No active cardiopulmonary disease.

## 2018-04-16 IMAGING — CR DG RIBS 2V*R*
4 series · 4 of 4 positions shown · non-contrast
Comparison: 01/31/2016

CLINICAL DATA: Right lower anterior rib pain after a fall 3 weeks
ago. Known ninth rib fracture

EXAM:
RIGHT RIBS - 2 VIEW

[w ribs ap upper right]
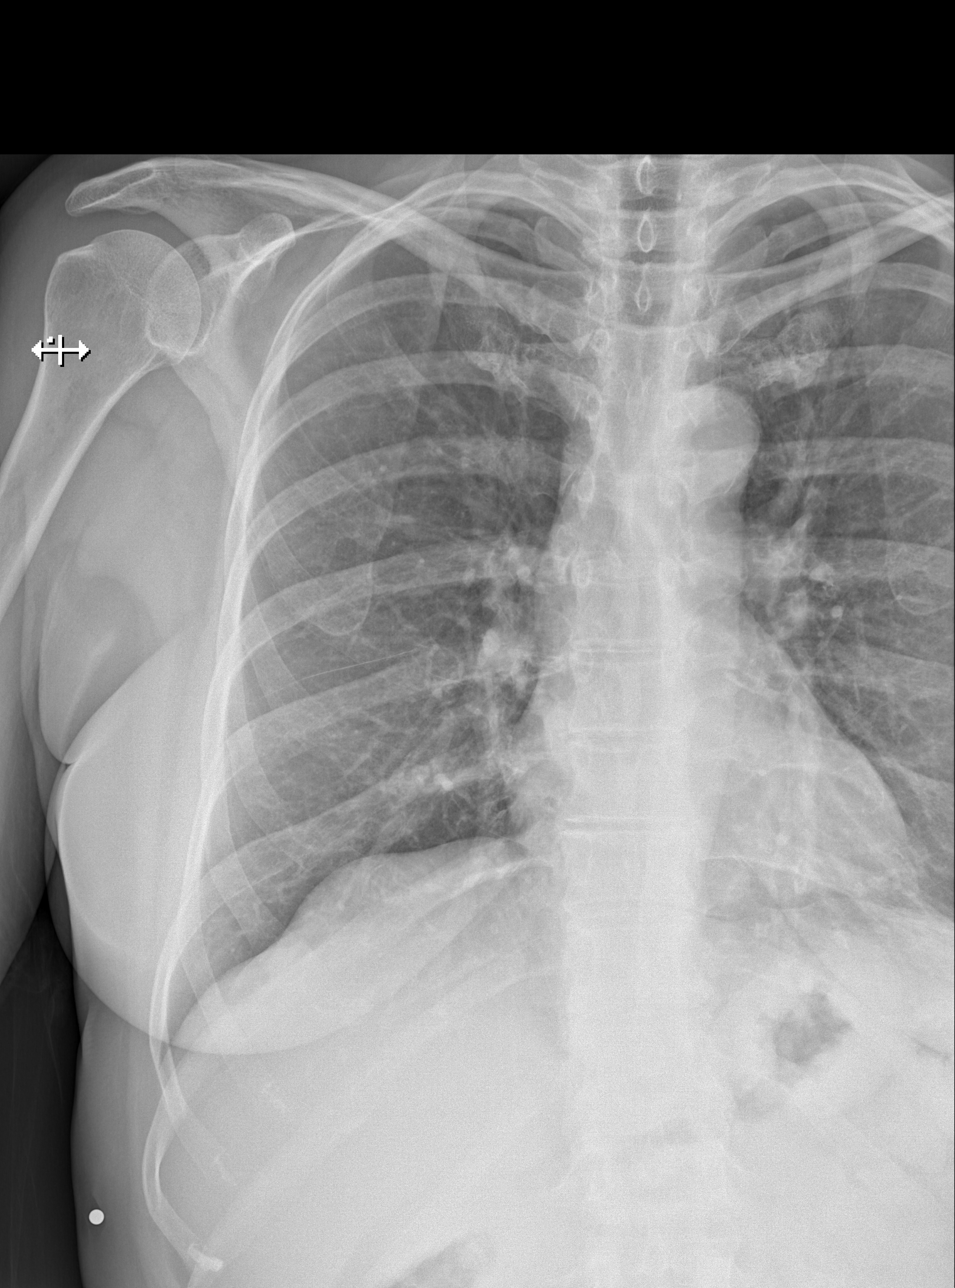

[w ribs ap lower right]
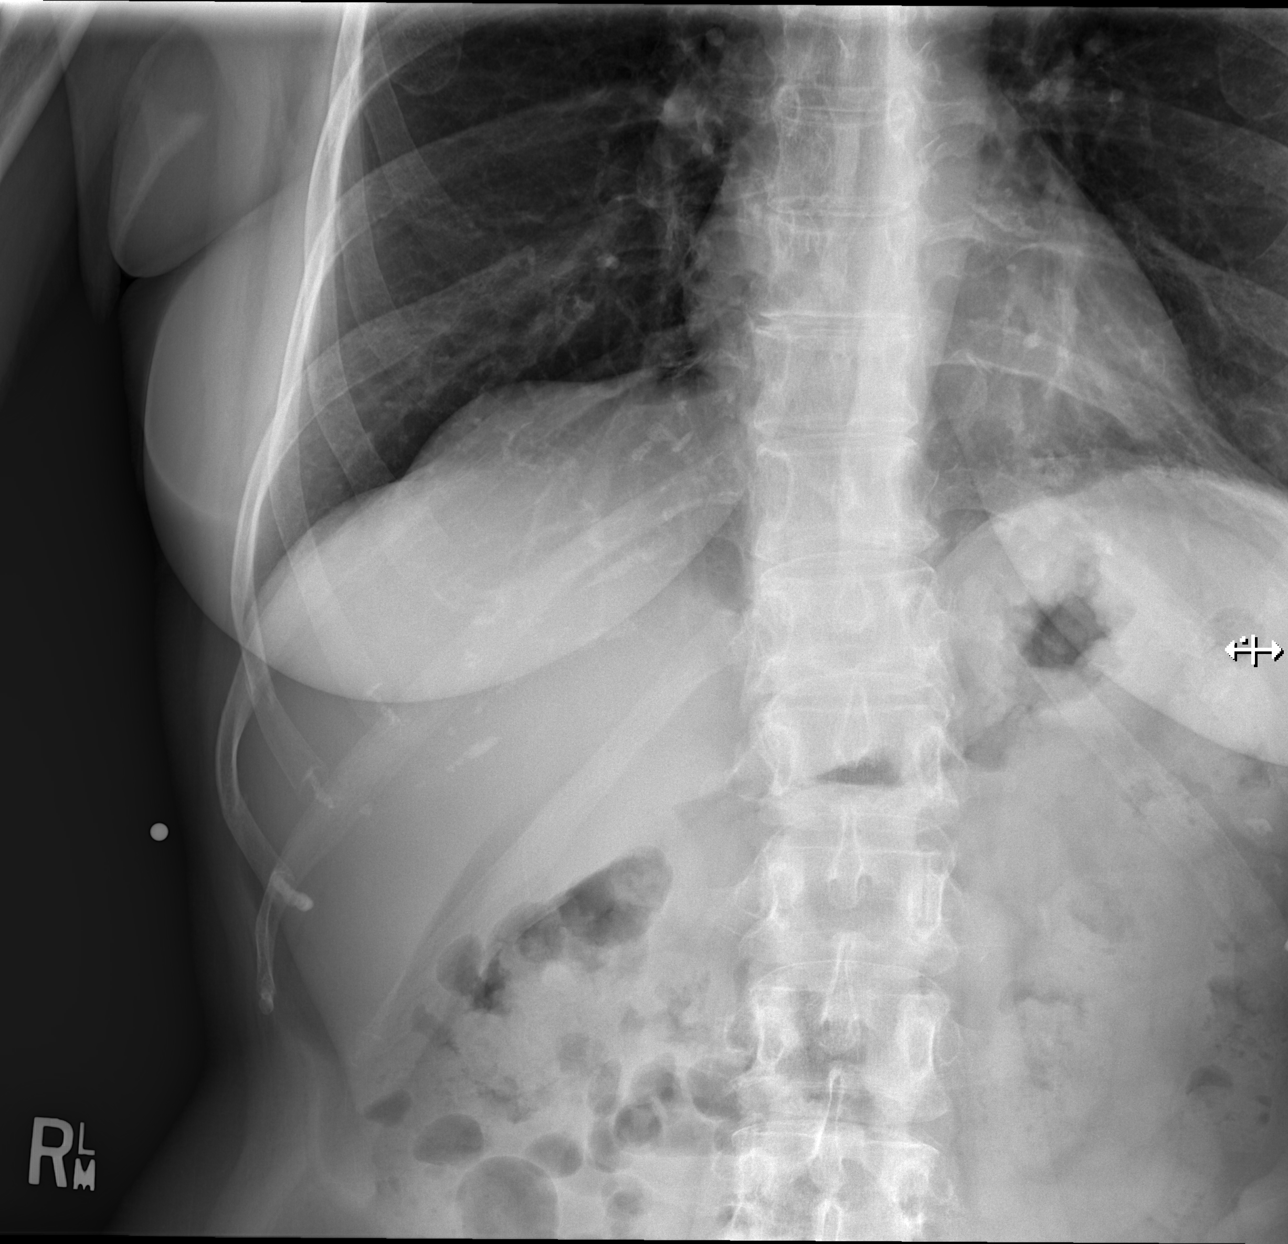

[w ribs obl right (1 of 2)]
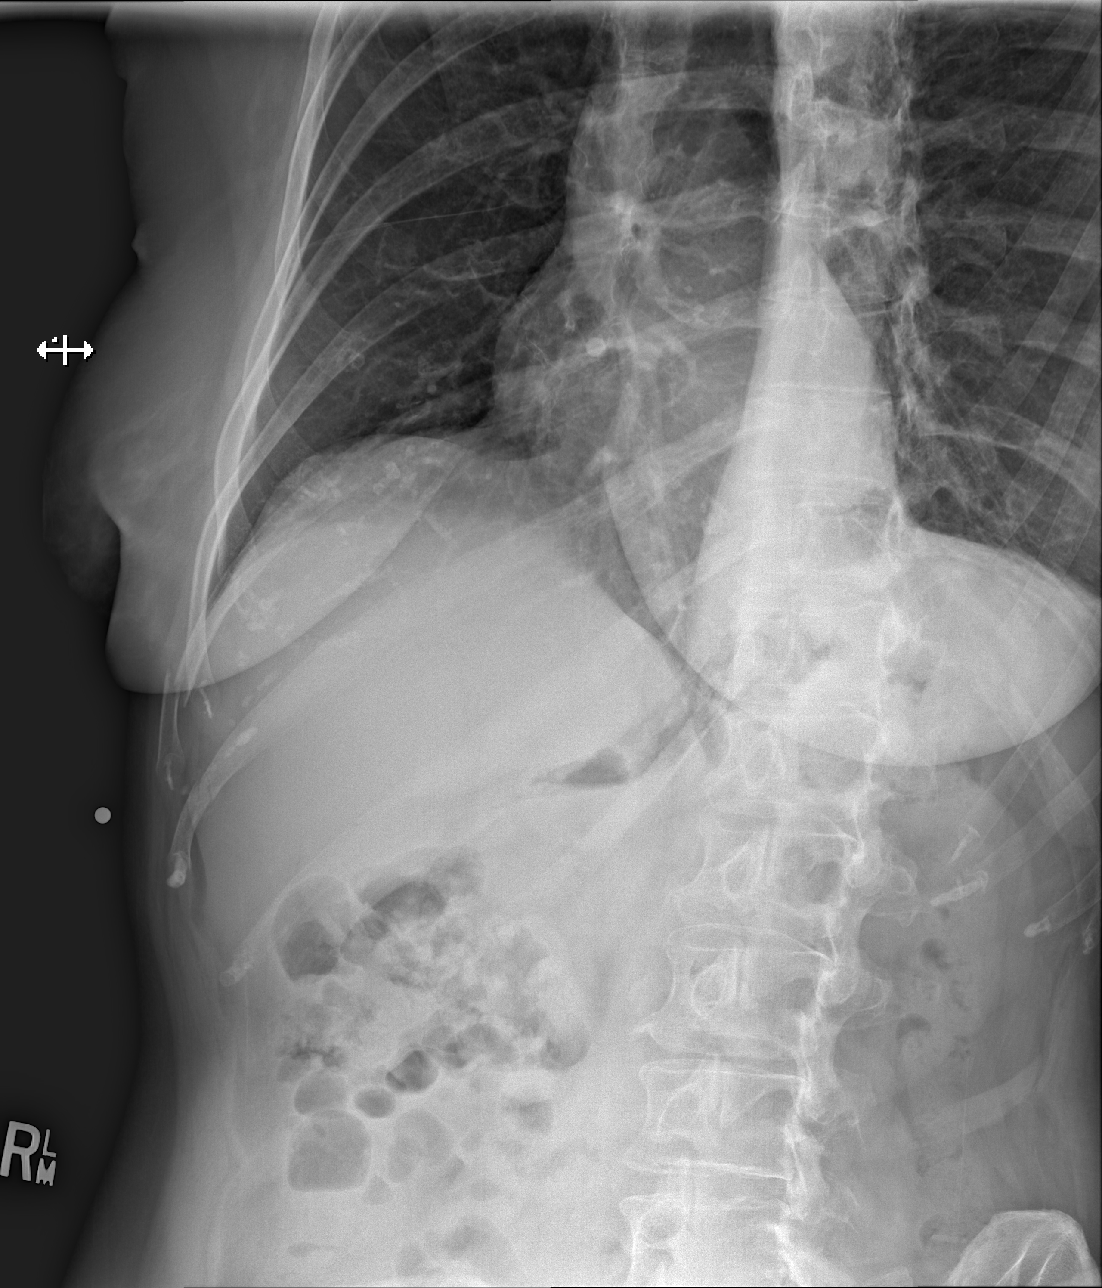

[w ribs obl right (2 of 2)]
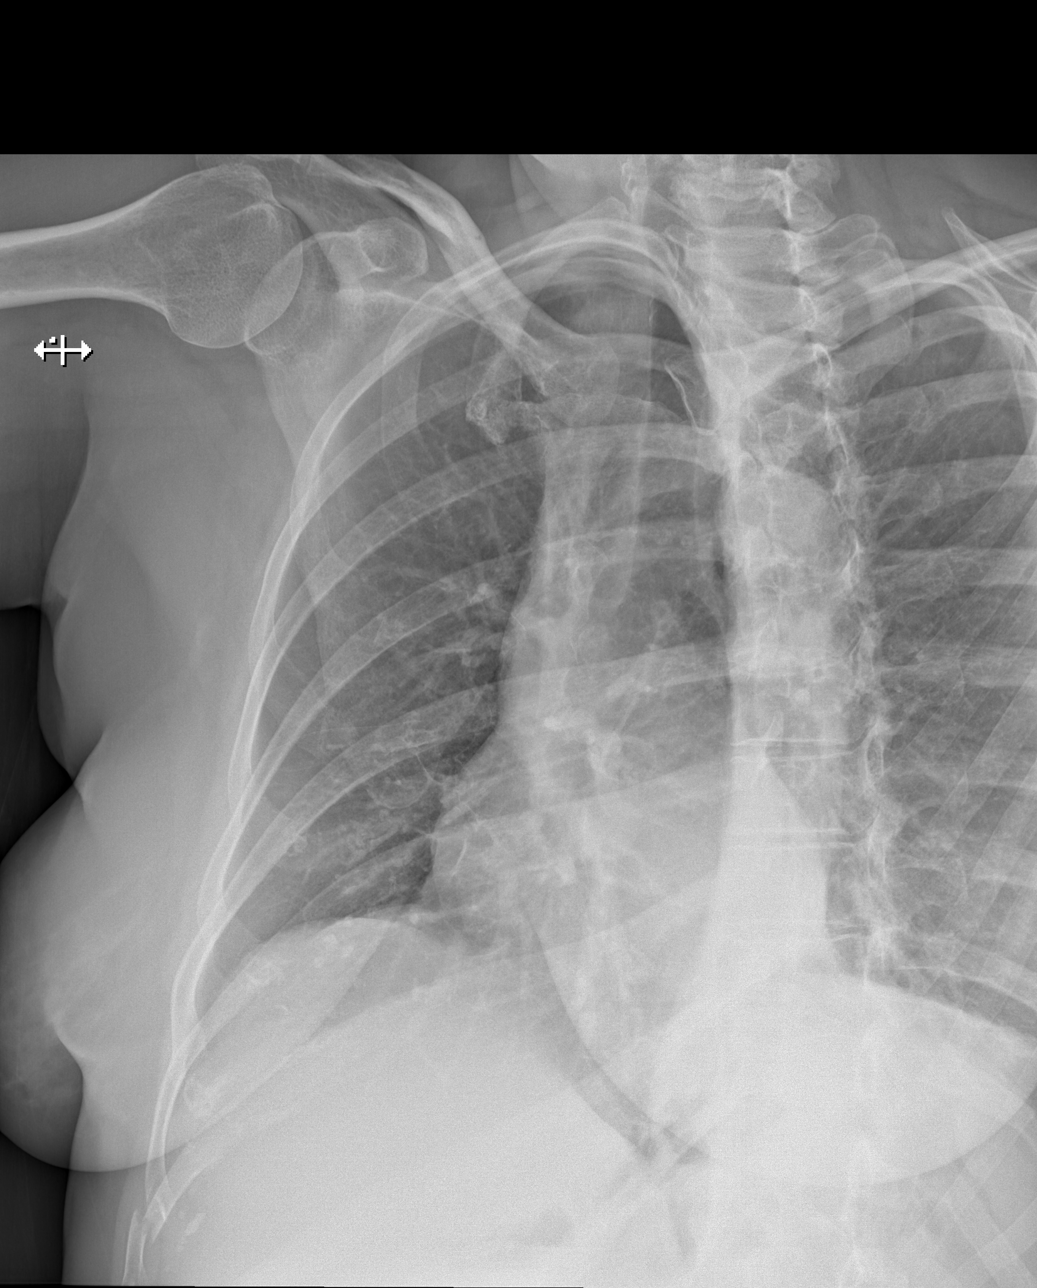

[4 of 4 positions shown; findings below may reference images not displayed]

FINDINGS: Mildly displaced fracture of the right anterior ninth rib with
similar appearance to previous study. Additional nondisplaced
fracture of the right anterolateral tenth rib is visualized. No
destructive bone lesions.
IMPRESSION: Fractures of the right anterior ninth and tenth ribs.

## 2018-04-28 ENCOUNTER — Ambulatory Visit (INDEPENDENT_AMBULATORY_CARE_PROVIDER_SITE_OTHER): Payer: No Typology Code available for payment source | Admitting: Orthopaedic Surgery

## 2018-04-28 ENCOUNTER — Encounter (INDEPENDENT_AMBULATORY_CARE_PROVIDER_SITE_OTHER): Payer: Self-pay | Admitting: Orthopaedic Surgery

## 2018-04-28 VITALS — BP 115/92 | HR 89 | Ht 62.0 in | Wt 150.0 lb

## 2018-04-28 DIAGNOSIS — M25512 Pain in left shoulder: Secondary | ICD-10-CM | POA: Diagnosis not present

## 2018-04-28 DIAGNOSIS — G8929 Other chronic pain: Secondary | ICD-10-CM | POA: Diagnosis not present

## 2018-04-28 MED ORDER — BUPIVACAINE HCL 0.5 % IJ SOLN
2.0000 mL | INTRAMUSCULAR | Status: AC | PRN
  Administered 2018-04-28: 2 mL via INTRA_ARTICULAR

## 2018-04-28 MED ORDER — METHYLPREDNISOLONE ACETATE 40 MG/ML IJ SUSP
80.0000 mg | INTRAMUSCULAR | Status: AC | PRN
  Administered 2018-04-28: 80 mg

## 2018-04-28 MED ORDER — LIDOCAINE HCL 2 % IJ SOLN
2.0000 mL | INTRAMUSCULAR | Status: AC | PRN
  Administered 2018-04-28: 2 mL

## 2018-04-28 NOTE — Progress Notes (Deleted)
   Office Visit Note   Patient: Sue [name]           Date of Birth: February 05, 1961           MRN: [id number] Visit Date: 04/28/2018              Requested by: Thressa Sheller, [address], [place], [place] [address] PCP: Thressa Sheller, MD   Assessment & Plan: Visit Diagnoses: No diagnosis found.  Plan: ***  Follow-Up Instructions: No follow-ups on file.   Orders:  No orders of the defined types were placed in this encounter.  No orders of the defined types were placed in this encounter.     Procedures: No procedures performed   Clinical Data: No additional findings.   Subjective: Chief Complaint  Patient presents with  . Left Shoulder - Pain    HPI  Review of Systems   Objective: Vital Signs: BP (!) 115/92   Pulse 89   Ht 5\' 2"  (1.575 m)   Wt 150 lb (68 kg)   LMP 10/30/2012   BMI 27.44 kg/m   Physical Exam  Ortho Exam  Specialty Comments:  No specialty comments available.  Imaging: No results found.   PMFS History: Patient Active Problem List   Diagnosis Date Noted  . Closed nondisplaced fracture of distal phalanx of right great toe 11/18/2017  . Left shoulder pain 08/16/2016  . Fracture of rib of right side 01/31/2016  . Acute bronchitis 07/22/2014  . Sebaceous cyst 06/30/2014  . Hypothyroid   . Hypertension   . Anxiety    Past Medical History:  Diagnosis Date  . Anxiety   . Hypertension   . Hypothyroid     Family History  Problem Relation Age of Onset  . Uterine cancer Maternal Grandmother   . Colon cancer Unknown        Paternal Event organiser  . Breast cancer Unknown        Maternal Great Grandmother  . Hypertension Father   . Heart attack Father   . Hypertension Brother   . Lupus Mother   . Osteoporosis Mother     Past Surgical History:  Procedure Laterality Date  . TONSILLECTOMY  3rd grade  . UPPER ESOPHAGEAL ENDOSCOPIC ULTRASOUND (EUS) N/A 02/25/2017   Procedure: UPPER ESOPHAGEAL  ENDOSCOPIC ULTRASOUND (EUS);  Surgeon: Carol Ada, MD;  Location: [facility];  Service: Endoscopy;  Laterality: N/A;   Social History   Occupational History  . Not on file  Tobacco Use  . Smoking status: Never Smoker  . Smokeless tobacco: Never Used  Substance and Sexual Activity  . Alcohol use: Yes    Alcohol/week: 7.0 standard drinks    Types: 7 Glasses of wine per week  . Drug use: No  . Sexual activity: Never    Partners: Male

## 2018-04-28 NOTE — Progress Notes (Signed)
   Procedure Note  Patient: Sue [name]             Date of Birth: 1961-03-21           MRN: [id number]             Visit Date: 04/28/2018  Procedures: Visit Diagnoses: Chronic left shoulder pain - Plan: Large Joint Inj: L subacromial bursa  Large Joint Inj: L subacromial bursa on 04/28/2018 2:08 PM Indications: pain and diagnostic evaluation Details: 25 G 1.5 in needle, anterolateral approach  Arthrogram: No  Medications: 2 mL lidocaine 2 %; 2 mL bupivacaine 0.5 %; 80 mg methylPREDNISolone acetate 40 MG/ML Consent was given by the patient. Immediately prior to procedure a time out was called to verify the correct patient, procedure, equipment, support staff and site/side marked as required. Patient was prepped and draped in the usual sterile fashion.

## 2018-04-28 NOTE — Progress Notes (Deleted)
Office Visit Note   Patient: Sue [name]           Date of Birth: 05-Mar-1961           MRN: [id number] Visit Date: 04/28/2018              Requested by: Thressa Sheller, [place], [place], [place] [address] PCP: Thressa Sheller, MD   Assessment & Plan: Visit Diagnoses:  1. Chronic left shoulder pain     Plan: Impingement syndrome left shoulder will inject subacromial space with cortisone and monitor response  Follow-Up Instructions: Return if symptoms worsen or fail to improve.   Orders:  Orders Placed This Encounter  Procedures  . Large Joint Inj: L subacromial bursa   No orders of the defined types were placed in this encounter.     Procedures: No procedures performed   Clinical Data: No additional findings.   Subjective: Chief Complaint  Patient presents with  . Left Shoulder - Pain   Seen 2 weeks ago for evaluation of bilateral shoulder pain.  I injected the subacromial space of the right shoulder with cortisone and she relates that she is doing well without any pain.  She is, however, having some difficulty with the left shoulder as outlined 2 weeks ago.  She is had difficulty with overhead motion no injury or trauma.  No numbness or tingling HPI  Review of Systems  Neurological: Positive for weakness.     Objective: Vital Signs: BP (!) 115/92   Pulse 89   Ht 5\' 2"  (1.575 m)   Wt 150 lb (68 kg)   LMP 10/30/2012   BMI 27.44 kg/m   Physical Exam  Constitutional: She is oriented to person, place, and time. She appears well-developed and well-nourished.  HENT:  Mouth/Throat: Oropharynx is clear and moist.  Eyes: Pupils are equal, round, and reactive to light. EOM are normal.  Pulmonary/Chest: Effort normal.  Neurological: She is alert and oriented to person, place, and time.  Skin: Skin is warm and dry.  Psychiatric: She has a normal mood and affect. Her behavior is normal.    Ortho Exam awake alert and oriented  comfortable sitting.  No pain with range of motion of right shoulder.  Full motion.  Left shoulder with positive impingement and some tenderness over the biceps tendon similar to that which was examined on the right before the injection.  Full overhead motion full internal rotation.  Good grip and good release.  Skin intact.  No pain with range of motion of cervical spine  Specialty Comments:  No specialty comments available.  Imaging: No results found.   PMFS History: Patient Active Problem List   Diagnosis Date Noted  . Closed nondisplaced fracture of distal phalanx of right great toe 11/18/2017  . Left shoulder pain 08/16/2016  . Fracture of rib of right side 01/31/2016  . Acute bronchitis 07/22/2014  . Sebaceous cyst 06/30/2014  . Hypothyroid   . Hypertension   . Anxiety    Past Medical History:  Diagnosis Date  . Anxiety   . Hypertension   . Hypothyroid     Family History  Problem Relation Age of Onset  . Uterine cancer Maternal Grandmother   . Colon cancer Unknown        Paternal Event organiser  . Breast cancer Unknown        Maternal Great Grandmother  . Hypertension Father   . Heart attack Father   . Hypertension Brother   .  Lupus Mother   . Osteoporosis Mother     Past Surgical History:  Procedure Laterality Date  . TONSILLECTOMY  3rd grade  . UPPER ESOPHAGEAL ENDOSCOPIC ULTRASOUND (EUS) N/A 02/25/2017   Procedure: UPPER ESOPHAGEAL ENDOSCOPIC ULTRASOUND (EUS);  Surgeon: Carol Ada, MD;  Location: [facility];  Service: Endoscopy;  Laterality: N/A;   Social History   Occupational History  . Not on file  Tobacco Use  . Smoking status: Never Smoker  . Smokeless tobacco: Never Used  Substance and Sexual Activity  . Alcohol use: Yes    Alcohol/week: 7.0 standard drinks    Types: 7 Glasses of wine per week  . Drug use: No  . Sexual activity: Never    Partners: Male

## 2018-05-11 MED FILL — clonazePAM 1 MG TABS: 1 | 30 days supply | Qty: 30 | Fill #0

## 2018-05-14 MED FILL — OMEPRAZOLE 20 MG CPDR: 20 | 30 days supply | Qty: 60 | Fill #4

## 2018-05-15 MED FILL — SERTRALINE HCL 50 MG TABLET: 50 | 90 days supply | Qty: 90 | Fill #0

## 2018-06-10 MED FILL — clonazePAM 1 MG TABS: 1 | 30 days supply | Qty: 30 | Fill #1

## 2018-06-11 MED FILL — LEVOTHYROXINE 25 MCG TABLET: 25 | 90 days supply | Qty: 90 | Fill #0

## 2018-06-11 MED FILL — LOSARTAN-HCTZ 100-25 MG TAB: 100-25 | 90 days supply | Qty: 90 | Fill #0

## 2018-06-16 MED FILL — OMEPRAZOLE 20 MG CPDR: 20 | 30 days supply | Qty: 60 | Fill #5

## 2018-07-09 MED FILL — clonazePAM 1 MG TABS: 1 | 30 days supply | Qty: 30 | Fill #2

## 2018-07-16 MED FILL — OMEPRAZOLE 20 MG CPDR: 20 | 30 days supply | Qty: 60 | Fill #6

## 2018-07-21 MED FILL — SERTRALINE HCL 100 MG TAB: 100 | 30 days supply | Qty: 30 | Fill #0

## 2018-08-06 MED FILL — clonazePAM 1 MG TABS: 1 | 30 days supply | Qty: 30 | Fill #3 | Status: TO

## 2018-08-14 MED FILL — OMEPRAZOLE 20 MG CPDR: 20 | 30 days supply | Qty: 60 | Fill #7 | Status: TO

## 2018-08-20 ENCOUNTER — Other Ambulatory Visit: Payer: Self-pay

## 2018-08-20 ENCOUNTER — Ambulatory Visit (HOSPITAL_COMMUNITY)
Admission: EM | Admit: 2018-08-20 | Discharge: 2018-08-20 | Disposition: A | Discharge status: Home / Self Care | Payer: No Typology Code available for payment source | Attending: Internal Medicine | Admitting: Internal Medicine

## 2018-08-20 ENCOUNTER — Encounter (HOSPITAL_COMMUNITY): Payer: Self-pay

## 2018-08-20 ENCOUNTER — Ambulatory Visit (INDEPENDENT_AMBULATORY_CARE_PROVIDER_SITE_OTHER): Payer: No Typology Code available for payment source

## 2018-08-20 DIAGNOSIS — S92354A Nondisplaced fracture of fifth metatarsal bone, right foot, initial encounter for closed fracture: Secondary | ICD-10-CM

## 2018-08-20 DIAGNOSIS — W101XXA Fall (on)(from) sidewalk curb, initial encounter: Secondary | ICD-10-CM | POA: Diagnosis not present

## 2018-08-20 MED ORDER — ACETAMINOPHEN 325 MG PO TABS: 650.0000 mg | ORAL_TABLET | Freq: Four times a day (QID) | ORAL | Status: DC | PRN

## 2018-08-20 NOTE — ED Provider Notes (Signed)
[facility]    CSN: [id number] Arrival date & time: 08/20/18  0857     History   Chief Complaint Chief Complaint  Patient presents with  . Foot Pain    HPI Sue [name] is a 58 y.o. female with a history of hypothyroidism and hypertension comes to urgent care with complaints of right foot pain with swelling of sudden onset yesterday evening.  There is followed stepping off a curb and rolling her ankle over.  Pain is currently severe, 10 out of 10.  Pain is sharp and constant.  Aggravated by trying to put weight on it.  No relieving factors.  Patient has not tried any over-the-counter medication.  She denies any numbness or tingling in the toes.Marland Kitchen   HPI  Past Medical History:  Diagnosis Date  . Anxiety   . Hypertension   . Hypothyroid     Patient Active Problem List   Diagnosis Date Noted  . Closed nondisplaced fracture of distal phalanx of right great toe 11/18/2017  . Left shoulder pain 08/16/2016  . Fracture of rib of right side 01/31/2016  . Acute bronchitis 07/22/2014  . Sebaceous cyst 06/30/2014  . Hypothyroid   . Hypertension   . Anxiety     Past Surgical History:  Procedure Laterality Date  . TONSILLECTOMY  3rd grade  . UPPER ESOPHAGEAL ENDOSCOPIC ULTRASOUND (EUS) N/A 02/25/2017   Procedure: UPPER ESOPHAGEAL ENDOSCOPIC ULTRASOUND (EUS);  Surgeon: Carol Ada, MD;  Location: [facility];  Service: Endoscopy;  Laterality: N/A;    OB History    Gravida  0   Para  0   Term  0   Preterm  0   AB  0   Living  0     SAB  0   TAB  0   Ectopic  0   Multiple  0   Live Births               Home Medications    Prior to Admission medications   Medication Sig Start Date End Date Taking? Authorizing Provider  clonazePAM (KLONOPIN) 1 MG tablet TAKE 1 TABLET BY MOUTH AT BEDTIME IF NEEDED 02/27/17   Laverle Hobby, MD  levothyroxine (SYNTHROID, LEVOTHROID) 25 MCG tablet Take 25 mcg by mouth daily before breakfast. 11/26/16    [provider]  losartan (COZAAR) 25 MG tablet Take by mouth.    [provider]  losartan-hydrochlorothiazide (HYZAAR) 100-12.5 MG tablet Take 1 tablet by mouth daily. 11/26/16   [provider]  Multiple Vitamin (MULTIVITAMIN WITH MINERALS) TABS tablet Take 1 tablet by mouth daily.    [provider]  omeprazole (PRILOSEC) 20 MG capsule Take 20 mg by mouth 2 (two) times daily. 03/16/18   [provider]  sertraline (ZOLOFT) 25 MG tablet Take 25 mg daily by mouth.    [provider]    Family History Family History  Problem Relation Age of Onset  . Uterine cancer Maternal Grandmother   . Colon cancer Other        Paternal Event organiser  . Breast cancer Other        Maternal Great Grandmother  . Hypertension Father   . Heart attack Father   . Hypertension Brother   . Lupus Mother   . Osteoporosis Mother     Social History Social History   Tobacco Use  . Smoking status: Never Smoker  . Smokeless tobacco: Never Used  Substance Use Topics  . Alcohol use: Yes  Alcohol/week: 7.0 standard drinks    Types: 7 Glasses of wine per week  . Drug use: No     Allergies   Oxycodone   Review of Systems Review of Systems  Constitutional: Negative for activity change and appetite change.  Respiratory: Negative for cough, chest tightness and wheezing.   Gastrointestinal: Negative for abdominal pain, diarrhea and nausea.  Musculoskeletal: Positive for arthralgias, joint swelling and myalgias. Negative for back pain.  Neurological: Negative for dizziness, weakness and light-headedness.  Psychiatric/Behavioral: Negative for agitation and confusion.  All other systems reviewed and are negative.    Physical Exam Triage Vital Signs ED Triage Vitals  Enc Vitals Group     BP 08/20/18 0917 131/90     Pulse Rate 08/20/18 0914 71     Resp 08/20/18 0914 15     Temp 08/20/18 0914 98.2 F (36.8 C)     Temp Source 08/20/18 0914  Oral     SpO2 08/20/18 0914 97 %     Weight --      Height --      Head Circumference --      Peak Flow --      Pain Score 08/20/18 0912 6     Pain Loc --      Pain Edu? --      Excl. in [place]? --    No data found.  Updated Vital Signs BP 131/90   Pulse 71   Temp 98.2 F (36.8 C) (Oral)   Resp 15   LMP 10/30/2012   SpO2 97%   Visual Acuity Right Eye Distance:   Left Eye Distance:   Bilateral Distance:    Right Eye Near:   Left Eye Near:    Bilateral Near:     Physical Exam Vitals signs and nursing note reviewed.  Constitutional:      General: She is in acute distress.     Appearance: Normal appearance.  HENT:     Head: Normocephalic and atraumatic.     Nose: Nose normal.     Mouth/Throat:     Mouth: Mucous membranes are moist.  Cardiovascular:     Rate and Rhythm: Normal rate and regular rhythm.     Pulses: Normal pulses.     Heart sounds: Normal heart sounds.  Pulmonary:     Effort: Pulmonary effort is normal.     Breath sounds: Normal breath sounds.  Abdominal:     General: Bowel sounds are normal.     Palpations: Abdomen is soft.  Musculoskeletal:        General: Swelling, tenderness, deformity and signs of injury present.     Right lower leg: No edema.     Left lower leg: No edema.     Comments: Right foot swelling on the lateral aspect of the midfoot.  Mild bruising.  Tender to palpation or movement.  Skin:    General: Skin is warm.     Capillary Refill: Capillary refill takes less than 2 seconds.     Coloration: Skin is not jaundiced.     Findings: Bruising present.  Neurological:     General: No focal deficit present.     Mental Status: She is alert and oriented to person, place, and time.  Psychiatric:        Mood and Affect: Mood normal.      UC Treatments / Results  Labs (all labs ordered are listed, but only abnormal results are displayed) Labs Reviewed - No data to display  EKG None  Radiology Dg Foot Complete Right  Result  Date: 08/20/2018 CLINICAL DATA:  Injury to right foot. Lateral foot pain. History of fracture involving the right great toe. EXAM: RIGHT FOOT COMPLETE - 3+ VIEW COMPARISON:  None. FINDINGS: Lucency involving the base of the fifth metatarsal bone. Findings are compatible with a nondisplaced fracture. There is a transverse and longitudinal component to the fracture that probably extends to the fifth TMT joint. No other new fractures. Prominent plantar calcaneal spur. Mild irregularity of the great toe tuft compatible with old injury. IMPRESSION: Nondisplaced fracture involving the base of the fifth metatarsal bone. Electronically Signed   By: Markus Daft M.D.   On: 08/20/2018 09:46    Procedures Procedures (including critical care time)  Medications Ordered in UC Medications - No data to display  Initial Impression / Assessment and Plan / UC Course  I have reviewed the triage vital signs and the nursing notes.  Pertinent labs & imaging results that were available during my care of the patient were reviewed by me and considered in my medical decision making (see chart for details).     1.  Metatarsal fracture: X-ray of the right foot confirms with metatarsal fracture Tylenol 650 as needed for pain Postop boot Crutches Follow-up with orthopedic surgeon this week  Final Clinical Impressions(s) / UC Diagnoses   Final diagnoses:  None   Discharge Instructions   None    ED Prescriptions    None     Controlled Substance Prescriptions [place] Controlled Substance Registry consulted? No   Chase Picket, MD 08/20/18 1104

## 2018-08-20 NOTE — ED Triage Notes (Signed)
Patient presents to Urgent Care with complaints of right foot pain and swelling since last night while stepping off a curb. Patient states she rolled her ankle, swelling noted to right dorsal surface, laterally.

## 2018-08-24 ENCOUNTER — Encounter (INDEPENDENT_AMBULATORY_CARE_PROVIDER_SITE_OTHER): Payer: Self-pay | Admitting: Orthopaedic Surgery

## 2018-08-24 ENCOUNTER — Ambulatory Visit (INDEPENDENT_AMBULATORY_CARE_PROVIDER_SITE_OTHER): Payer: No Typology Code available for payment source | Admitting: Orthopaedic Surgery

## 2018-08-24 ENCOUNTER — Other Ambulatory Visit: Payer: Self-pay

## 2018-08-24 VITALS — BP 132/93 | HR 63 | Resp 14 | Ht 62.0 in | Wt 155.0 lb

## 2018-08-24 DIAGNOSIS — M79671 Pain in right foot: Secondary | ICD-10-CM | POA: Diagnosis not present

## 2018-08-24 NOTE — Progress Notes (Deleted)
   Office Visit Note   Patient: Sue [name]           Date of Birth: May 29, 1961           MRN: [id number] Visit Date: 08/24/2018              Requested by: Ala Bent, MD [address] [place], [place] [address] PCP: Ala Bent, MD   Assessment & Plan: Visit Diagnoses:  1. Right foot pain     Plan: ***  Follow-Up Instructions: Return in about 1 month (around 09/24/2018).   Orders:  No orders of the defined types were placed in this encounter.  No orders of the defined types were placed in this encounter.     Procedures: No procedures performed   Clinical Data: No additional findings.   Subjective: Chief Complaint  Patient presents with  . Right Foot - Injury   Sue [name] is a 58 year old female who presents with 5th metatarsal fracture. She was walking her dog x 4 days ago. She went to [facility] for x-rays.   HPI  Review of Systems   Objective: Vital Signs: BP (!) 132/93 (BP Location: Right Arm, Patient Position: Sitting, Cuff Size: Normal)   Pulse 63   Resp 14   Ht 5\' 2"  (1.575 m)   Wt 155 lb (70.3 kg)   LMP 10/30/2012   BMI 28.35 kg/m   Physical Exam  Ortho Exam  Specialty Comments:  No specialty comments available.  Imaging: No results found.   PMFS History: Patient Active Problem List   Diagnosis Date Noted  . Closed nondisplaced fracture of distal phalanx of right great toe 11/18/2017  . Left shoulder pain 08/16/2016  . Fracture of rib of right side 01/31/2016  . Acute bronchitis 07/22/2014  . Sebaceous cyst 06/30/2014  . Hypothyroid   . Hypertension   . Anxiety    Past Medical History:  Diagnosis Date  . Anxiety   . Hypertension   . Hypothyroid     Family History  Problem Relation Age of Onset  . Uterine cancer Maternal Grandmother   . Colon cancer Other        Paternal Event organiser  . Breast cancer Other        Maternal Great Grandmother  . Hypertension Father   . Heart attack Father   .  Hypertension Brother   . Lupus Mother   . Osteoporosis Mother     Past Surgical History:  Procedure Laterality Date  . TONSILLECTOMY  3rd grade  . UPPER ESOPHAGEAL ENDOSCOPIC ULTRASOUND (EUS) N/A 02/25/2017   Procedure: UPPER ESOPHAGEAL ENDOSCOPIC ULTRASOUND (EUS);  Surgeon: Carol Ada, MD;  Location: [facility];  Service: Endoscopy;  Laterality: N/A;   Social History   Occupational History  . Not on file  Tobacco Use  . Smoking status: Never Smoker  . Smokeless tobacco: Never Used  Substance and Sexual Activity  . Alcohol use: Yes    Alcohol/week: 4.0 standard drinks    Types: 4 Glasses of wine per week  . Drug use: No  . Sexual activity: Never    Partners: Male

## 2018-08-24 NOTE — Progress Notes (Signed)
Office Visit Note   Patient: Sue [name]           Date of Birth: [birth year]           MRN: [id number] Visit Date: 08/24/2018              Requested by: Ala Bent, [name] [address] [place], [place] [address] PCP: Ala Bent, [name]   Assessment & Plan: Visit Diagnoses:  1. Right foot pain     Plan: Nondisplaced fracture base of right fifth metatarsal.  Fracture is more of an avulsion type than a true Jones fracture Manufacturing systems engineer.  Crutches.  Tylenol for pain.  Office 1 month for repeat films Long discussion regarding healing, time limitation of activities  Follow-Up Instructions: Return in about 1 month (around 09/24/2018).   Orders:  No orders of the defined types were placed in this encounter.  No orders of the defined types were placed in this encounter.     Procedures: No procedures performed   Clinical Data: No additional findings.   Subjective: No chief complaint on file. Sue [name] is 58 years old and injured her right foot walking her dog last Thursday.  She sustained an inversion injury to her foot with immediate onset of pain and swelling.  She was seen at a local emergent care facility with films demonstrating a nondisplaced fracture of the base of the fifth metatarsal.  She was given crutches and a wooden shoe.  She still uncomfortable but swelling has subsided.  No numbness or tingling. I did review the films of her right foot on the PACS system.  There is a nondisplaced fracture at the base of the fifth metatarsal appears to be an avulsion type.  There is an intra-articular component.  HPI  Review of Systems   Objective: Vital Signs: LMP 10/30/2012   Physical Exam Constitutional:      Appearance: She is well-developed.  Eyes:     Pupils: Pupils are equal, round, and reactive to light.  Pulmonary:     Effort: Pulmonary effort is normal.  Skin:    General: Skin is warm and dry.  Neurological:     Mental Status: She is alert and oriented  to person, place, and time.  Psychiatric:        Behavior: Behavior normal.     Ortho Exam awake alert and oriented x3.  Comfortable sitting.  Still has some swelling in the midfoot and pain over the fifth metatarsal right foot as expected.  Sensory intact  Specialty Comments:  No specialty comments available.  Imaging: No results found.   PMFS History: Patient Active Problem List   Diagnosis Date Noted  . Closed nondisplaced fracture of distal phalanx of right great toe 11/18/2017  . Left shoulder pain 08/16/2016  . Fracture of rib of right side 01/31/2016  . Acute bronchitis 07/22/2014  . Sebaceous cyst 06/30/2014  . Hypothyroid   . Hypertension   . Anxiety    Past Medical History:  Diagnosis Date  . Anxiety   . Hypertension   . Hypothyroid     Family History  Problem Relation Age of Onset  . Uterine cancer Maternal Grandmother   . Colon cancer Other        Paternal Event organiser  . Breast cancer Other        Maternal Great Grandmother  . Hypertension Father   . Heart attack Father   . Hypertension Brother   . Lupus Mother   .  Osteoporosis Mother     Past Surgical History:  Procedure Laterality Date  . TONSILLECTOMY  3rd grade  . UPPER ESOPHAGEAL ENDOSCOPIC ULTRASOUND (EUS) N/A 02/25/2017   Procedure: UPPER ESOPHAGEAL ENDOSCOPIC ULTRASOUND (EUS);  Surgeon: Carol Ada, [name];  Location: [facility];  Service: Endoscopy;  Laterality: N/A;   Social History   Occupational History  . Not on file  Tobacco Use  . Smoking status: Never Smoker  . Smokeless tobacco: Never Used  Substance and Sexual Activity  . Alcohol use: Yes    Alcohol/week: 7.0 standard drinks    Types: 7 Glasses of wine per week  . Drug use: No  . Sexual activity: Never    Partners: Male     Sue [name]   Note - This record has been created using [employer].  Chart creation errors have been sought, but may not always  have been located. Such creation errors do  not reflect on  the standard of medical care.

## 2018-09-03 MED FILL — SERTRALINE HCL 100 MG TAB: 100 | 90 days supply | Qty: 90 | Fill #0

## 2018-09-03 MED FILL — clonazePAM 1 MG TABS: 1 | 30 days supply | Qty: 30 | Fill #0

## 2018-09-14 MED FILL — LOSARTAN-HCTZ 100-25 MG TAB: 100-25 | 30 days supply | Qty: 30 | Fill #0

## 2018-09-14 MED FILL — OMEPRAZOLE 20 MG CPDR: 20 | 30 days supply | Qty: 60 | Fill #0

## 2018-09-14 MED FILL — LEVOTHYROXINE 25 MCG TABLET: 25 | 90 days supply | Qty: 90 | Fill #0

## 2018-09-21 ENCOUNTER — Ambulatory Visit (INDEPENDENT_AMBULATORY_CARE_PROVIDER_SITE_OTHER): Payer: No Typology Code available for payment source | Admitting: Orthopaedic Surgery

## 2018-09-22 ENCOUNTER — Encounter (INDEPENDENT_AMBULATORY_CARE_PROVIDER_SITE_OTHER): Payer: Self-pay | Admitting: Orthopaedic Surgery

## 2018-09-22 ENCOUNTER — Ambulatory Visit (INDEPENDENT_AMBULATORY_CARE_PROVIDER_SITE_OTHER): Payer: No Typology Code available for payment source | Admitting: Orthopaedic Surgery

## 2018-09-22 ENCOUNTER — Other Ambulatory Visit: Payer: Self-pay

## 2018-09-22 VITALS — BP 117/83 | HR 64 | Ht 62.0 in | Wt 155.0 lb

## 2018-09-22 DIAGNOSIS — M79671 Pain in right foot: Secondary | ICD-10-CM | POA: Diagnosis not present

## 2018-09-22 NOTE — Progress Notes (Signed)
Office Visit Note   Patient: Sue [name]           Date of Birth: September 10, 1960           MRN: [id number] Visit Date: 09/22/2018              Requested by: Ala Bent, MD [address] [place], [place] [address] PCP: Ala Bent, MD   Assessment & Plan: Visit Diagnoses:  1. Right foot pain     Plan: 1 month status post nondisplaced avulsion fracture base of the right fifth metatarsal.  Has been wearing equalizer boot and doing very well.  No swelling or ecchymosis and walking without a limp.  Will wean herself from the equalizer boot and increased activity as tolerated.  Will reevaluate as needed.  No further films necessary Follow-Up Instructions: Return if symptoms worsen or fail to improve.   Orders:  No orders of the defined types were placed in this encounter.  No orders of the defined types were placed in this encounter.     Procedures: No procedures performed   Clinical Data: No additional findings.   Subjective: Chief Complaint  Patient presents with  . Right Foot - Follow-up    DOI 08/19/18  Patient presents today for follow up on her right fifth metatarsal fracture. She injured her foot on 08/19/18 and has been wearing an Manufacturing systems engineer. Patient states that she is doing well. No swelling. She has been able to walk around the house at night without the boot.   HPI  Review of Systems  Constitutional: Negative for fatigue.  HENT: Negative for ear pain.   Eyes: Negative for pain.  Respiratory: Negative for shortness of breath.   Cardiovascular: Negative for leg swelling.  Gastrointestinal: Negative for constipation and diarrhea.  Endocrine: Negative for cold intolerance and heat intolerance.  Genitourinary: Negative for difficulty urinating.  Musculoskeletal: Negative for joint swelling.  Skin: Negative for rash.  Allergic/Immunologic: Negative for food allergies.  Neurological: Negative for weakness.  Hematological: Does not bruise/bleed  easily.  Psychiatric/Behavioral: Negative for sleep disturbance.     Objective: Vital Signs: BP 117/83   Pulse 64   Ht 5\' 2"  (1.575 m)   Wt 155 lb (70.3 kg)   LMP 10/30/2012   BMI 28.35 kg/m   Physical Exam Constitutional:      Appearance: She is well-developed.  Eyes:     Pupils: Pupils are equal, round, and reactive to light.  Pulmonary:     Effort: Pulmonary effort is normal.  Skin:    General: Skin is warm and dry.  Neurological:     Mental Status: She is alert and oriented to person, place, and time.  Psychiatric:        Behavior: Behavior normal.     Ortho Exam awake and alert and always pleasant.  Without a limp in the equalizer boot.  Equalizer boot was removed there is minimal tenderness at the base of the right fifth metatarsal.  Skin intact.  No ecchymosis.  Neurologic exam intact.  Able to walk without a limp without the boot.  Specialty Comments:  No specialty comments available.  Imaging: No results found.   PMFS History: Patient Active Problem List   Diagnosis Date Noted  . Closed nondisplaced fracture of distal phalanx of right great toe 11/18/2017  . Left shoulder pain 08/16/2016  . Fracture of rib of right side 01/31/2016  . Acute bronchitis 07/22/2014  . Sebaceous cyst 06/30/2014  . Hypothyroid   .  Hypertension   . Anxiety    Past Medical History:  Diagnosis Date  . Anxiety   . Hypertension   . Hypothyroid     Family History  Problem Relation Age of Onset  . Uterine cancer Maternal Grandmother   . Colon cancer Other        Paternal Event organiser  . Breast cancer Other        Maternal Great Grandmother  . Hypertension Father   . Heart attack Father   . Hypertension Brother   . Lupus Mother   . Osteoporosis Mother     Past Surgical History:  Procedure Laterality Date  . TONSILLECTOMY  3rd grade  . UPPER ESOPHAGEAL ENDOSCOPIC ULTRASOUND (EUS) N/A 02/25/2017   Procedure: UPPER ESOPHAGEAL ENDOSCOPIC ULTRASOUND (EUS);  Surgeon:  Carol Ada, MD;  Location: [facility];  Service: Endoscopy;  Laterality: N/A;   Social History   Occupational History  . Not on file  Tobacco Use  . Smoking status: Never Smoker  . Smokeless tobacco: Never Used  Substance and Sexual Activity  . Alcohol use: Yes    Alcohol/week: 4.0 standard drinks    Types: 4 Glasses of wine per week  . Drug use: No  . Sexual activity: Never    Partners: Male

## 2018-10-05 MED FILL — clonazePAM 1 MG TABS: 1 | 30 days supply | Qty: 30 | Fill #1 | Status: TO

## 2018-10-11 MED FILL — OMEPRAZOLE 20 MG CPDR: 20 | 30 days supply | Qty: 60 | Fill #1 | Status: TO

## 2018-10-11 MED FILL — LOSARTAN-HCTZ 100-25 MG TAB: 100-25 | 30 days supply | Qty: 30 | Fill #1 | Status: TO

## 2018-11-09 MED FILL — LOSARTAN-HCTZ 100-25 MG TAB: 100-25 | 30 days supply | Qty: 30 | Fill #0

## 2018-11-09 MED FILL — OMEPRAZOLE 20 MG CPDR: 20 | 30 days supply | Qty: 60 | Fill #0

## 2018-11-11 MED FILL — clonazePAM 1 MG TABS: 1 | 30 days supply | Qty: 30 | Fill #0

## 2018-12-14 MED FILL — LOSARTAN-HCTZ 100-25 MG TAB: 100-25 | 90 days supply | Qty: 90 | Fill #0

## 2018-12-14 MED FILL — OMEPRAZOLE 20 MG CPDR: 20 | 30 days supply | Qty: 60 | Fill #0

## 2018-12-14 MED FILL — clonazePAM 1 MG TABS: 1 | 30 days supply | Qty: 30 | Fill #1

## 2018-12-17 MED FILL — SERTRALINE HCL 100 MG TAB: 100 | 90 days supply | Qty: 90 | Fill #0

## 2019-01-07 MED FILL — LEVOTHYROXINE 25 MCG TABLET: 25 | 90 days supply | Qty: 90 | Fill #0

## 2019-01-14 MED FILL — OMEPRAZOLE 20 MG CAPSULE DR: 20 | 30 days supply | Qty: 60 | Fill #1

## 2019-02-15 MED FILL — OMEPRAZOLE 20 MG CAPSULE DR: 20 | 30 days supply | Qty: 60 | Fill #2

## 2019-03-11 MED FILL — OMEPRAZOLE 20 MG CAPSULE DR: 20 | 30 days supply | Qty: 60 | Fill #3

## 2019-03-22 MED FILL — SERTRALINE HCL 100 MG TAB: 100 | 90 days supply | Qty: 90 | Fill #0

## 2019-04-01 ENCOUNTER — Other Ambulatory Visit: Payer: Self-pay

## 2019-04-01 ENCOUNTER — Encounter: Payer: Self-pay | Admitting: Orthopaedic Surgery

## 2019-04-01 ENCOUNTER — Ambulatory Visit: Payer: No Typology Code available for payment source | Admitting: Orthopaedic Surgery

## 2019-04-01 DIAGNOSIS — M722 Plantar fascial fibromatosis: Secondary | ICD-10-CM | POA: Insufficient documentation

## 2019-04-01 MED ORDER — METHYLPREDNISOLONE ACETATE 40 MG/ML IJ SUSP
40.0000 mg | INTRAMUSCULAR | Status: AC | PRN
  Administered 2019-04-01: 14:00:00 40 mg

## 2019-04-01 MED ORDER — BUPIVACAINE HCL 0.5 % IJ SOLN
1.0000 mL | INTRAMUSCULAR | Status: AC | PRN
  Administered 2019-04-01: 14:00:00 1 mL

## 2019-04-01 MED FILL — LOSARTAN-HCTZ 100-25 MG TAB: 100-25 | 90 days supply | Qty: 90 | Fill #0

## 2019-04-01 NOTE — Progress Notes (Deleted)
     Office Visit Note   Patient: Sue [name]           Date of Birth: 05/31/1961           MRN: [id number] Visit Date: 04/01/2019              Requested by: Ala Bent, MD [address] [place],  [place] [address] PCP: Ala Bent, MD   Assessment & Plan: Visit Diagnoses:  1. Plantar fasciitis of right foot     Plan: ***  Follow-Up Instructions: Return if symptoms worsen or fail to improve.   Orders:  No orders of the defined types were placed in this encounter.  No orders of the defined types were placed in this encounter.     Procedures: No procedures performed   Clinical Data: No additional findings.   Subjective: No chief complaint on file.   HPI  Patient complains of bilateral foot pain. She is requesting possible bilateral foot injections. States she received this before at another office Dr Durward Fortes worked at.  She states right worse than left foot pain and especially in her heels and at times painful to WTB after prolonged sitting.   Review of Systems   Objective: Vital Signs: LMP 10/30/2012   Physical Exam  Ortho Exam  Specialty Comments:  No specialty comments available.  Imaging: No results found.   PMFS History: Patient Active Problem List   Diagnosis Date Noted  . Plantar fasciitis of right foot 04/01/2019  . Closed nondisplaced fracture of distal phalanx of right great toe 11/18/2017  . Left shoulder pain 08/16/2016  . Fracture of rib of right side 01/31/2016  . Acute bronchitis 07/22/2014  . Sebaceous cyst 06/30/2014  . Hypothyroid   . Hypertension   . Anxiety    Past Medical History:  Diagnosis Date  . Anxiety   . Hypertension   . Hypothyroid     Family History  Problem Relation Age of Onset  . Uterine cancer Maternal Grandmother   . Colon cancer Other        Paternal Event organiser  . Breast cancer Other        Maternal Great Grandmother  . Hypertension Father   . Heart attack Father   .  Hypertension Brother   . Lupus Mother   . Osteoporosis Mother     Past Surgical History:  Procedure Laterality Date  . TONSILLECTOMY  3rd grade  . UPPER ESOPHAGEAL ENDOSCOPIC ULTRASOUND (EUS) N/A 02/25/2017   Procedure: UPPER ESOPHAGEAL ENDOSCOPIC ULTRASOUND (EUS);  Surgeon: Carol Ada, MD;  Location: [facility];  Service: Endoscopy;  Laterality: N/A;   Social History   Occupational History  . Not on file  Tobacco Use  . Smoking status: Never Smoker  . Smokeless tobacco: Never Used  Substance and Sexual Activity  . Alcohol use: Yes    Alcohol/week: 4.0 standard drinks    Types: 4 Glasses of wine per week  . Drug use: No  . Sexual activity: Never    Partners: Male

## 2019-04-01 NOTE — Progress Notes (Signed)
Office Visit Note   Patient: Sue [name]           Date of Birth: 1961/01/13           MRN: [id number] Visit Date: 04/01/2019              Requested by: Ala Bent, MD [address] [place],  [place] [address] PCP: Ala Bent, MD   Assessment & Plan: Visit Diagnoses:  1. Plantar fasciitis of right foot     Plan: Prior history of plantar fascial injection both feet.  More symptomatic on the right.  Will inject the right heel today and reevaluate in 2 weeks for consideration of injecting the left heel  Follow-Up Instructions: Return if symptoms worsen or fail to improve.   Orders:  No orders of the defined types were placed in this encounter.  No orders of the defined types were placed in this encounter.     Procedures: Foot Inj  Date/Time: 04/01/2019 2:05 PM Performed by: Garald Balding, MD Authorized by: Garald Balding, MD   Condition: Plantar Fasciitis   Location: right plantar fascia muscle   Needle Size:  27 G Medications:  1 mL bupivacaine 0.5 %; 40 mg methylPREDNISolone acetate 40 MG/ML     Clinical Data: No additional findings.   Subjective: No chief complaint on file. Cinthia has a history of recurrent plantar fasciitis of both heels.  I last injected her heels many years ago when she is done well until just recently.  There is no history of injury or trauma but she is experiencing pain but tickly when she gets up from a sitting position or when she first wakes up in the morning on the plantar aspect of both heels.  She has had some referred pain along the plantar fascial to the metatarsal heads.  No redness or swelling.  No numbness or tingling.  She has noticed a small knot in the mid plantar fascial of her right foot that is minimally tender  HPI  Review of Systems   Objective: Vital Signs: LMP 10/30/2012   Physical Exam Constitutional:      Appearance: She is well-developed.  Eyes:     Pupils: Pupils are equal, round,  and reactive to light.  Pulmonary:     Effort: Pulmonary effort is normal.  Skin:    General: Skin is warm and dry.  Neurological:     Mental Status: She is alert and oriented to person, place, and time.  Psychiatric:        Behavior: Behavior normal.     Ortho Exam awake alert and oriented x3.  Comfortable sitting but with weightbearing has some pain in the plantar aspect of both heels.  The right is more symptomatic than the left.  There is local tenderness directly over the plantar fascial attachment to the right os calcis.  There is also a small plantar fibroma in the mid right plantar fascial.  Its less than a centimeter in diameter minimally uncomfortable.  It is freely mobile.  Toes are not swollen  Specialty Comments:  No specialty comments available.  Imaging: No results found.   PMFS History: Patient Active Problem List   Diagnosis Date Noted  . Plantar fasciitis of right foot 04/01/2019  . Closed nondisplaced fracture of distal phalanx of right great toe 11/18/2017  . Left shoulder pain 08/16/2016  . Fracture of rib of right side 01/31/2016  . Acute bronchitis 07/22/2014  . Sebaceous cyst 06/30/2014  .  Hypothyroid   . Hypertension   . Anxiety    Past Medical History:  Diagnosis Date  . Anxiety   . Hypertension   . Hypothyroid     Family History  Problem Relation Age of Onset  . Uterine cancer Maternal Grandmother   . Colon cancer Other        Paternal Event organiser  . Breast cancer Other        Maternal Great Grandmother  . Hypertension Father   . Heart attack Father   . Hypertension Brother   . Lupus Mother   . Osteoporosis Mother     Past Surgical History:  Procedure Laterality Date  . TONSILLECTOMY  3rd grade  . UPPER ESOPHAGEAL ENDOSCOPIC ULTRASOUND (EUS) N/A 02/25/2017   Procedure: UPPER ESOPHAGEAL ENDOSCOPIC ULTRASOUND (EUS);  Surgeon: Carol Ada, MD;  Location: [facility];  Service: Endoscopy;  Laterality: N/A;   Social History    Occupational History  . Not on file  Tobacco Use  . Smoking status: Never Smoker  . Smokeless tobacco: Never Used  Substance and Sexual Activity  . Alcohol use: Yes    Alcohol/week: 4.0 standard drinks    Types: 4 Glasses of wine per week  . Drug use: No  . Sexual activity: Never    Partners: Male     Garald Balding, MD   Note - This record has been created using [employer].  Chart creation errors have been sought, but may not always  have been located. Such creation errors do not reflect on  the standard of medical care.

## 2019-04-01 NOTE — Progress Notes (Deleted)
   Procedure Note  Patient: Sue [name]             Date of Birth: [birth year]           MRN: [id number]             Visit Date: 04/01/2019  Procedures: Visit Diagnoses:  1. Plantar fasciitis of right foot     Foot Inj  Date/Time: 04/01/2019 1:59 PM Performed by: Garald Balding, MD Authorized by: Garald Balding, MD   Condition: Plantar Fasciitis   Location: right plantar fascia muscle   Medications:  1 mL bupivacaine 0.5 %; 40 mg methylPREDNISolone acetate 40 MG/ML

## 2019-04-12 MED FILL — OMEPRAZOLE 20 MG CAPSULE DR: 20 | 30 days supply | Qty: 60 | Fill #4

## 2019-04-12 MED FILL — LEVOTHYROXINE 25 MCG TABLET: 25 | 90 days supply | Qty: 90 | Fill #1

## 2019-05-11 MED FILL — OMEPRAZOLE 20 MG CAPSULE DR: 20 | 30 days supply | Qty: 60 | Fill #5

## 2019-06-14 MED FILL — OMEPRAZOLE 20 MG CAPSULE DR: 20 | 30 days supply | Qty: 60 | Fill #6

## 2019-06-14 MED FILL — SERTRALINE HCL 100 MG TAB: 100 | 90 days supply | Qty: 90 | Fill #1

## 2019-07-06 ENCOUNTER — Ambulatory Visit (INDEPENDENT_AMBULATORY_CARE_PROVIDER_SITE_OTHER): Payer: No Typology Code available for payment source | Admitting: Internal Medicine

## 2019-07-06 ENCOUNTER — Other Ambulatory Visit: Payer: Self-pay

## 2019-07-06 ENCOUNTER — Encounter: Payer: Self-pay | Admitting: Internal Medicine

## 2019-07-06 DIAGNOSIS — K219 Gastro-esophageal reflux disease without esophagitis: Secondary | ICD-10-CM | POA: Insufficient documentation

## 2019-07-06 DIAGNOSIS — I10 Essential (primary) hypertension: Secondary | ICD-10-CM | POA: Diagnosis not present

## 2019-07-06 DIAGNOSIS — E039 Hypothyroidism, unspecified: Secondary | ICD-10-CM | POA: Diagnosis not present

## 2019-07-06 DIAGNOSIS — F5101 Primary insomnia: Secondary | ICD-10-CM

## 2019-07-06 DIAGNOSIS — F419 Anxiety disorder, unspecified: Secondary | ICD-10-CM | POA: Diagnosis not present

## 2019-07-06 DIAGNOSIS — G4700 Insomnia, unspecified: Secondary | ICD-10-CM | POA: Insufficient documentation

## 2019-07-06 MED ORDER — LEVOTHYROXINE SODIUM 25 MCG PO TABS: 25.0000 ug | ORAL_TABLET | Freq: Every day | ORAL | 1 refills | Status: DC

## 2019-07-06 MED ORDER — LOSARTAN POTASSIUM-HCTZ 50-12.5 MG PO TABS: 1.0000 | ORAL_TABLET | Freq: Every day | ORAL | 1 refills | Status: DC

## 2019-07-06 MED ORDER — TRAZODONE HCL 50 MG PO TABS: 25.0000 mg | ORAL_TABLET | Freq: Every evening | ORAL | 1 refills | Status: DC | PRN

## 2019-07-06 MED FILL — traZODone HCL 50 MG TABS: 50 | 30 days supply | Qty: 30 | Fill #0

## 2019-07-06 MED FILL — LEVOTHYROXINE SODIUM 25 MCG: 25 | 90 days supply | Qty: 90 | Fill #0

## 2019-07-06 MED FILL — LOSARTAN-HCTZ 50-12.5 MG TA: 50-12.5 | 90 days supply | Qty: 90 | Fill #0

## 2019-07-06 NOTE — Assessment & Plan Note (Signed)
Will decrease Losartan to 50-12.5 mg daily Will check CMET at annual exam

## 2019-07-06 NOTE — Assessment & Plan Note (Signed)
Will decrease Sertraline to 50 mg PO daily Support offered today Will monitor

## 2019-07-06 NOTE — Progress Notes (Signed)
HPI  Pt presents to the clinic today to establish care and for management of the conditions listed below. She is transferring care from Dr. Shelia Media.  Anxiety: Managed on Sertraline and Clonazepam (on average 1 x week) at bedtime for sleep. She is not following with a therapist. She denies depression, SI/HI.  HTN: Her BP today is 126/74. She is taking Losartan HCT as prescribed. Her BP at home runs 117/76. ECG from 02/2011 reviewed.  Hypothyroid: She denies any issues on her current dose of Levothyroxine. She last had her levels checked 03/2019.  GERD: Triggered by spicy foods, onions or peppers. She denies breakthrough on Omeprazole. She had an upper GI from 02/2017 reviewed.  Flu: 03/2019 Tetanus: 03/2010 Pap Smear: > 5 years ago Mammogram: 02/2018 Bone Density: never Colon Screening: 2016 Dr. Collene Mares Vision Screening: annually Dentist: biannually  Past Medical History:  Diagnosis Date  . Anxiety   . Hypertension   . Hypothyroid     Current Outpatient Medications  Medication Sig Dispense Refill  . acetaminophen (TYLENOL) 325 MG tablet Take 2 tablets (650 mg total) by mouth every 6 (six) hours as needed.    . clonazePAM (KLONOPIN) 1 MG tablet TAKE 1 TABLET BY MOUTH AT BEDTIME IF NEEDED 30 tablet 0  . levothyroxine (SYNTHROID, LEVOTHROID) 25 MCG tablet Take 25 mcg by mouth daily before breakfast.  1  . losartan (COZAAR) 25 MG tablet Take by mouth.    . losartan-hydrochlorothiazide (HYZAAR) 100-12.5 MG tablet Take 1 tablet by mouth daily.  1  . Multiple Vitamin (MULTIVITAMIN WITH MINERALS) TABS tablet Take 1 tablet by mouth daily.    Marland Kitchen omeprazole (PRILOSEC) 20 MG capsule Take 20 mg by mouth 2 (two) times daily.  10  . sertraline (ZOLOFT) 100 MG tablet      No current facility-administered medications for this visit.    Allergies  Allergen Reactions  . Oxycodone Itching    Family History  Problem Relation Age of Onset  . Uterine cancer Maternal Grandmother   . Colon cancer  Other        Paternal Event organiser  . Breast cancer Other        Maternal Great Grandmother  . Hypertension Father   . Heart attack Father   . Hypertension Brother   . Lupus Mother   . Osteoporosis Mother     Social History   Socioeconomic History  . Marital status: Single    Spouse name: Not on file  . Number of children: Not on file  . Years of education: Not on file  . Highest education level: Not on file  Occupational History  . Not on file  Tobacco Use  . Smoking status: Never Smoker  . Smokeless tobacco: Never Used  Substance and Sexual Activity  . Alcohol use: Yes    Alcohol/week: 4.0 standard drinks    Types: 4 Glasses of wine per week  . Drug use: No  . Sexual activity: Never    Partners: Male  Other Topics Concern  . Not on file  Social History Narrative  . Not on file   Social Determinants of Health   Financial Resource Strain:   . Difficulty of Paying Living Expenses: Not on file  Food Insecurity:   . Worried About Charity fundraiser in the Last Year: Not on file  . Ran Out of Food in the Last Year: Not on file  Transportation Needs:   . Lack of Transportation (Medical): Not on file  . Lack  of Transportation (Non-Medical): Not on file  Physical Activity:   . Days of Exercise per Week: Not on file  . Minutes of Exercise per Session: Not on file  Stress:   . Feeling of Stress : Not on file  Social Connections:   . Frequency of Communication with Friends and Family: Not on file  . Frequency of Social Gatherings with Friends and Family: Not on file  . Attends Religious Services: Not on file  . Active Member of Clubs or Organizations: Not on file  . Attends Archivist Meetings: Not on file  . Marital Status: Not on file  Intimate Partner Violence:   . Fear of Current or Ex-Partner: Not on file  . Emotionally Abused: Not on file  . Physically Abused: Not on file  . Sexually Abused: Not on file    ROS:  Constitutional: Denies  fever, malaise, fatigue, headache or abrupt weight changes.  HEENT: Denies eye pain, eye redness, ear pain, ringing in the ears, wax buildup, runny nose, nasal congestion, bloody nose, or sore throat. Respiratory: Denies difficulty breathing, shortness of breath, cough or sputum production.   Cardiovascular: Denies chest pain, chest tightness, palpitations or swelling in the hands or feet.  Gastrointestinal: Denies abdominal pain, bloating, constipation, diarrhea or blood in the stool.  GU: Denies frequency, urgency, pain with urination, blood in urine, odor or discharge. Musculoskeletal: Denies decrease in range of motion, difficulty with gait, muscle pain or joint pain and swelling.  Skin: Denies redness, rashes, lesions or ulcercations.  Neurological: Pt reports insomnia. Denies dizziness, difficulty with memory, difficulty with speech or problems with balance and coordination.  Psych: Pt reports anxiety. Denies depression, SI/HI.  No other specific complaints in a complete review of systems (except as listed in HPI above).  PE: BP 126/74   Pulse 68   Temp (!) 97.4 F (36.3 C) (Temporal)   Ht 5' 2.33" (1.583 m)   Wt 160 lb (72.6 kg)   LMP 10/30/2012   SpO2 97%   BMI 28.96 kg/m   Wt Readings from Last 3 Encounters:  09/22/18 155 lb (70.3 kg)  08/24/18 155 lb (70.3 kg)  04/28/18 150 lb (68 kg)    General: Appears her stated age, well developed, well nourished in NAD. HEENT: Head: normal shape and size;  Neck: Neck supple, trachea midline. No masses, lumps or thyromegaly present.  Cardiovascular: Normal rate and rhythm. S1,S2 noted.  No murmur, rubs or gallops noted. No JVD or BLE edema.  Pulmonary/Chest: Normal effort and positive vesicular breath sounds. No respiratory distress. No wheezes, rales or ronchi noted.  Abdomen: Soft and nontender. Normal bowel sounds. Musculoskeletal:  No difficulty with gait.  Neurological: Alert and oriented. Psychiatric: Mood and affect normal.  Behavior is normal. Judgment and thought content normal.   BMET    Component Value Date/Time   NA 138 01/22/2011 1313   K 3.7 01/22/2011 1313   CL 101 01/22/2011 1313   GLUCOSE 96 01/22/2011 1313   BUN 9 01/22/2011 1313   CREATININE 0.70 01/22/2011 1313    Lipid Panel  No results found for: CHOL, TRIG, HDL, CHOLHDL, VLDL, LDLCALC  CBC    Component Value Date/Time   HGB 13.9 01/22/2011 1313   HCT 41.0 01/22/2011 1313    Hgb A1C No results found for: HGBA1C   Assessment and Plan:

## 2019-07-06 NOTE — Assessment & Plan Note (Signed)
Will d/c Clonazepam as not indicated for sleep and addictive RX for Trazadone 25-50 mg PO QHS prn- sedation caution given

## 2019-07-06 NOTE — Assessment & Plan Note (Signed)
TSH and free T4 will be checked at annual exam Levothyroxine refilled today

## 2019-07-06 NOTE — Assessment & Plan Note (Signed)
Avoid foods that trigger your reflux Continue Omeprazole 

## 2019-07-06 NOTE — Patient Instructions (Signed)

## 2019-07-14 MED FILL — OMEPRAZOLE DR 20 MG CAPSULE: 20 | 30 days supply | Qty: 60 | Fill #7

## 2019-08-02 MED FILL — traZODone HCL 50 MG TABS: 50 | 30 days supply | Qty: 30 | Fill #1

## 2019-08-16 ENCOUNTER — Telehealth: Payer: Self-pay

## 2019-08-16 MED FILL — OMEPRAZOLE DR 20 MG CAPSULE: 20 | 30 days supply | Qty: 60 | Fill #8

## 2019-08-16 NOTE — Telephone Encounter (Signed)
Patient called in to report concern for how her 07/06/19 OV was filed with her insurance as they were denying based on the fact that it was billed under 'specialist services"??  I reviewed patients claim with Dawn in Billing/Coding to help me better understand the situation. I also notice that the patients demographic information had Ala Bent (Neurologist) incorrectly as her PCP and this was not corrected at the time of her NP check in with Webb Silversmith, NP on 07/06/19.  Her referring PCP was actually Dr. Albesa Seen who is an internal medicine physician within the [facility]. Dawn determined that billing and coding for the visit was correct but it does state "referred by Ala Bent". Dawn did not think this should have a bearing on the claim being rejected.   After reviewing further, we noted that Albesa Seen was still listed on patient's [employer] card and that this needed to be changed to current PCP Webb Silversmith and [facility] should reprocess the claim with corrected information. This should take care of the problem.  In addition, I have deleted Ala Bent from the referring physician list as patient has never seen this person, and added Albesa Seen to the referral MD list.   Patient notified to contact [facility] and update her PCP and ask them to reprocess. If for any reason this is not resolved after that, she is to call me back to assist her further.  After correcting this, she will call and schedule her physical care with [facility].   Patient thanked me for my help as she was unsuccessful with our billing department and reported having a negative experience with them. This has been passed along to management for notation.

## 2019-08-29 ENCOUNTER — Ambulatory Visit (HOSPITAL_COMMUNITY)
Admission: EM | Admit: 2019-08-29 | Discharge: 2019-08-29 | Disposition: A | Discharge status: Home / Self Care | Payer: No Typology Code available for payment source | Attending: Physician Assistant | Admitting: Physician Assistant

## 2019-08-29 ENCOUNTER — Ambulatory Visit (INDEPENDENT_AMBULATORY_CARE_PROVIDER_SITE_OTHER): Payer: No Typology Code available for payment source

## 2019-08-29 ENCOUNTER — Other Ambulatory Visit: Payer: Self-pay

## 2019-08-29 ENCOUNTER — Encounter (HOSPITAL_COMMUNITY): Payer: Self-pay | Admitting: Emergency Medicine

## 2019-08-29 DIAGNOSIS — S61401A Unspecified open wound of right hand, initial encounter: Secondary | ICD-10-CM

## 2019-08-29 DIAGNOSIS — W540XXA Bitten by dog, initial encounter: Secondary | ICD-10-CM | POA: Diagnosis not present

## 2019-08-29 DIAGNOSIS — S61431A Puncture wound without foreign body of right hand, initial encounter: Secondary | ICD-10-CM

## 2019-08-29 DIAGNOSIS — M79644 Pain in right finger(s): Secondary | ICD-10-CM | POA: Diagnosis not present

## 2019-08-29 DIAGNOSIS — Z23 Encounter for immunization: Secondary | ICD-10-CM | POA: Diagnosis not present

## 2019-08-29 MED ORDER — TETANUS-DIPHTH-ACELL PERTUSSIS 5-2.5-18.5 LF-MCG/0.5 IM SUSP
INTRAMUSCULAR | Status: AC
  Filled 2019-08-29: qty 0.5

## 2019-08-29 MED ORDER — HYDROCODONE-ACETAMINOPHEN 5-325 MG PO TABS
1.0000 | ORAL_TABLET | Freq: Once | ORAL | Status: AC
  Administered 2019-08-29: 1 via ORAL

## 2019-08-29 MED ORDER — TETANUS-DIPHTH-ACELL PERTUSSIS 5-2.5-18.5 LF-MCG/0.5 IM SUSP
0.5000 mL | Freq: Once | INTRAMUSCULAR | Status: AC
  Administered 2019-08-29: 0.5 mL via INTRAMUSCULAR

## 2019-08-29 MED ORDER — AMOXICILLIN-POT CLAVULANATE 875-125 MG PO TABS: 1.0000 | ORAL_TABLET | Freq: Two times a day (BID) | ORAL | 0 refills | Status: AC

## 2019-08-29 MED ORDER — HYDROCODONE-ACETAMINOPHEN 5-325 MG PO TABS
ORAL_TABLET | ORAL | Status: AC
  Filled 2019-08-29: qty 1

## 2019-08-29 MED ORDER — LIDOCAINE-EPINEPHRINE (PF) 2 %-1:200000 IJ SOLN
INTRAMUSCULAR | Status: AC
  Filled 2019-08-29: qty 20

## 2019-08-29 NOTE — Discharge Instructions (Signed)
Take the augmentin two times a day for 7 days.  Please schedule follow up in 1 week for wound check with Primary care or this clinic  If you notice purulent drainage or increasing amount of redness or severe pain or fever. Please return for reevaluation

## 2019-08-29 NOTE — ED Notes (Signed)
After x-ray, bleeding was not controlled, patient's hand still bleeding, nurse provided me with a no-stick bandage and gauze, applied a loose bandage. Patient was shivering, provided warm blanket

## 2019-08-29 NOTE — ED Provider Notes (Addendum)
[place]    CSN: [id number] Arrival date & time: 08/29/19  1556      History   Chief Complaint Chief Complaint  Patient presents with  . Animal Bite    HPI Sue [name] is a 59 y.o. female.   Patient reports urgent care for evaluation of dog bite.  She reports her dog bit her on the right hand today when giving treats.  She reports this occurred due to a fight between her 2 dogs over the treat and her hand was involved.  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  She reports a dog is up-to-date on its rabies vaccine and actually just got its rabies vaccine 3 weeks ago.  She reports severe pain over her hand and bleeding.  EMS did come to the scene and bandaged the wound.  She reported to urgent care for further evaluation.  She denies blood thinner use.  She reports she is able to move her fingers however the bones in front of her little finger hurt a lot.  Denies numbness or tingling in her fingers.  No previous surgeries to this hand.  Reports her last tetanus shot was 10 years ago.     Past Medical History:  Diagnosis Date  . Anxiety   . Hypertension   . Hypothyroid     Patient Active Problem List   Diagnosis Date Noted  . GERD (gastroesophageal reflux disease) 07/06/2019  . Insomnia 07/06/2019  . Hypothyroid   . Hypertension   . Anxiety     Past Surgical History:  Procedure Laterality Date  . TONSILLECTOMY  3rd grade  . UPPER ESOPHAGEAL ENDOSCOPIC ULTRASOUND (EUS) N/A 02/25/2017   Procedure: UPPER ESOPHAGEAL ENDOSCOPIC ULTRASOUND (EUS);  Surgeon: Carol Ada, MD;  Location: [facility];  Service: Endoscopy;  Laterality: N/A;    OB History    Gravida  0   Para  0   Term  0   Preterm  0   AB  0   Living  0     SAB  0   TAB  0   Ectopic  0   Multiple  0   Live Births               Home Medications    Prior to Admission medications   Medication Sig Start Date End Date Taking? Authorizing  Provider  levothyroxine (SYNTHROID) 25 MCG tablet Take 1 tablet (25 mcg total) by mouth daily before breakfast. 07/06/19  Yes Baity, Coralie Keens, NP  losartan-hydrochlorothiazide (HYZAAR) 50-12.5 MG tablet Take 1 tablet by mouth daily. 07/06/19  Yes [name], NP  Multiple Vitamin (MULTIVITAMIN WITH MINERALS) TABS tablet Take 1 tablet by mouth daily.   Yes [provider]  omeprazole (PRILOSEC) 20 MG capsule Take 20 mg by mouth 2 (two) times daily. 03/16/18  Yes [provider]  sertraline (ZOLOFT) 100 MG tablet  07/21/18  Yes [provider]  amoxicillin-clavulanate (AUGMENTIN) 875-125 MG tablet Take 1 tablet by mouth 2 (two) times daily for 7 days. 08/29/19 09/05/19  Kj Imbert, Marguerita Beards, PA-C  traZODone (DESYREL) 50 MG tablet Take 0.5-1 tablets (25-50 mg total) by mouth at bedtime as needed for sleep. 07/06/19   [name], NP    Family History Family History  Problem Relation Age of Onset  . Uterine cancer Maternal Grandmother   . Colon cancer Other        Paternal Event organiser  .  Breast cancer Other        Maternal Great Grandmother  . Hypertension Father   . Heart attack Father   . Hypertension Brother   . Lupus Mother   . Osteoporosis Mother     Social History Social History   Tobacco Use  . Smoking status: Never Smoker  . Smokeless tobacco: Never Used  Substance Use Topics  . Alcohol use: Yes    Alcohol/week: 4.0 standard drinks    Types: 4 Glasses of wine per week  . Drug use: No     Allergies   Oxycodone   Review of Systems Review of Systems  Skin: Positive for color change and wound.  Neurological: Negative for weakness and numbness.     Physical Exam Triage Vital Signs ED Triage Vitals  Enc Vitals Group     BP 08/29/19 1617 133/85     Pulse Rate 08/29/19 1617 87     Resp 08/29/19 1617 16     Temp 08/29/19 1617 98.7 F (37.1 C)     Temp Source 08/29/19 1617 Oral     SpO2 08/29/19 1617 97 %     Weight --      Height --       Head Circumference --      Peak Flow --      Pain Score 08/29/19 1614 5     Pain Loc --      Pain Edu? --      Excl. in [place]? --    No data found.  Updated Vital Signs BP 133/85 (BP Location: Left Arm)   Pulse 87   Temp 98.7 F (37.1 C) (Oral)   Resp 16   LMP 10/30/2012   SpO2 97%   Visual Acuity Right Eye Distance:   Left Eye Distance:   Bilateral Distance:    Right Eye Near:   Left Eye Near:    Bilateral Near:     Physical Exam Vitals and nursing note reviewed.  Constitutional:      General: She is not in acute distress.    Appearance: Normal appearance. She is well-developed. She is not ill-appearing.  HENT:     Head: Normocephalic and atraumatic.  Eyes:     Conjunctiva/sclera: Conjunctivae normal.  Cardiovascular:     Rate and Rhythm: Normal rate.     Heart sounds: No murmur.  Pulmonary:     Effort: Pulmonary effort is normal. No respiratory distress.  Musculoskeletal:     Cervical back: Neck supple.     Comments: Area of swelling and ecchymosis over the fifth metacarpal on the right hand.  There are bite wounds on the dorsum of the hand : 1 approximately 1.5 cm long extending into the superficial subcutaneous tissue.  There are 5 or 6 other smaller puncture/lacerations.  Patient able to make a fist and fully extend fingers.  Neurovascularly intact.  There are some bruising on the left hand as well however no wounds.  Skin:    General: Skin is warm and dry.  Neurological:     Mental Status: She is alert.      UC Treatments / Results  Labs (all labs ordered are listed, but only abnormal results are displayed) Labs Reviewed - No data to display  EKG   Radiology DG Hand Complete Right  Result Date: 08/29/2019 CLINICAL DATA:  Dog bite with severe fifth ray pain and swelling EXAM: RIGHT HAND - COMPLETE 3+ VIEW COMPARISON:  None. FINDINGS: No fracture or dislocation. No  suspicious focal osseous lesions. No significant arthropathy. Scattered soft tissue  gas and swelling throughout the dorsal ulnar aspect of the right hand. No radiopaque foreign body. IMPRESSION: Scattered soft tissue gas and swelling throughout the dorsal ulnar aspect of the right hand. No radiopaque foreign body. No fracture or malalignment. Electronically Signed   By: Ilona Sorrel M.D.   On: 08/29/2019 17:31    Procedures Laceration Repair  Date/Time: 08/29/2019 7:09 PM Performed by: Purnell Shoemaker, PA-C Authorized by: Purnell Shoemaker, PA-C   Consent:    Consent obtained:  Verbal   Consent given by:  Patient   Risks discussed:  Infection and need for additional repair   Alternatives discussed:  No treatment and delayed treatment Anesthesia (see MAR for exact dosages):    Anesthesia method:  None Laceration details:    Location:  Hand   Hand location:  R hand, dorsum   Length (cm):  1.5 Repair type:    Repair type:  Simple Pre-procedure details:    Preparation:  Patient was prepped and draped in usual sterile fashion Exploration:    Wound extent: areolar tissue violated     Wound extent: no foreign bodies/material noted and no tendon damage noted     Contaminated: no   Treatment:    Area cleansed with:  Betadine   Amount of cleaning:  Extensive   Irrigation solution:  Sterile saline   Irrigation volume:  60 cc   Irrigation method:  Syringe Skin repair:    Repair method:  Steri-Strips   Number of Steri-Strips:  3 Approximation:    Approximation:  Close Post-procedure details:    Dressing:  Antibiotic ointment, sterile dressing and bulky dressing   Patient tolerance of procedure:  Tolerated well, no immediate complications   (including critical care time)  Medications Ordered in UC Medications  HYDROcodone-acetaminophen (NORCO/VICODIN) 5-325 MG per tablet 1 tablet (1 tablet Oral Given 08/29/19 1659)  Tdap (BOOSTRIX) injection 0.5 mL (0.5 mLs Intramuscular Given 08/29/19 1731)    Initial Impression / Assessment and Plan / UC Course  I have reviewed the  triage vital signs and the nursing notes.  Pertinent labs & imaging results that were available during my care of the patient were reviewed by me and considered in my medical decision making (see chart for details).     #Dog bite with wounds. Patient is a 59 year old female presenting with acute dog bite to right hand.  X-rays negative for acute fracture.  Patient was copiously cleaned with sterile saline.  No sutures conducted as would like there to be drainage.  Augmentin was started.  Instructed to follow-up with primary care in 1 week for wound check.  Discussed that if she notices deterioration in use of her hand to follow-up primary care or her previous hand surgeon for consultation.  Signs of infection were discussed with patient she verbalized understanding.   Final Clinical Impressions(s) / UC Diagnoses   Final diagnoses:  Dog bite, initial encounter     Discharge Instructions     Take the augmentin two times a day for 7 days.  Please schedule follow up in 1 week for wound check with Primary care or this clinic  If you notice purulent drainage or increasing amount of redness or severe pain or fever. Please return for reevaluation    ED Prescriptions    Medication Sig Dispense Auth. Provider   amoxicillin-clavulanate (AUGMENTIN) 875-125 MG tablet Take 1 tablet by mouth 2 (two) times daily for 7 days.  14 tablet Triston Lisanti, Marguerita Beards, PA-C     I have reviewed the PDMP during this encounter.   Purnell Shoemaker, PA-C 08/29/19 1912    Saiya Crist, Marguerita Beards, PA-C 09/20/19 [phone]

## 2019-08-29 NOTE — ED Triage Notes (Signed)
Injury to right hand caused from patient's dog.  Dog has current shots per patient.  Wound is currently dressed.    EMS came to the scene and wrapped wound.    Patient can move fingers, right little finger hurts the most.

## 2019-08-29 NOTE — ED Notes (Signed)
Removed right hand dressing, cleansed hand of dried blood with tap water.  Jake, pa at bedside

## 2019-09-01 ENCOUNTER — Telehealth: Payer: Self-pay | Admitting: Internal Medicine

## 2019-09-01 DIAGNOSIS — L03119 Cellulitis of unspecified part of limb: Secondary | ICD-10-CM

## 2019-09-01 NOTE — Telephone Encounter (Signed)
Agree with UC evaluation.

## 2019-09-01 NOTE — Telephone Encounter (Signed)
Pt called to schedule appointment She stated she went to urgent care on 3/28 for dog bite.  She stated her hand is really swollen and red.  She stated it looks shiney it is so swollen. I spoke with Rena and she recommended pt go back to Urgent Care.  Pt is aware of Rena's comment

## 2019-09-02 ENCOUNTER — Ambulatory Visit (HOSPITAL_COMMUNITY)
Admission: EM | Admit: 2019-09-02 | Discharge: 2019-09-02 | Disposition: A | Discharge status: Home / Self Care | Payer: No Typology Code available for payment source | Attending: Physician Assistant | Admitting: Physician Assistant

## 2019-09-02 ENCOUNTER — Other Ambulatory Visit: Payer: Self-pay | Admitting: Orthopedic Surgery

## 2019-09-02 ENCOUNTER — Other Ambulatory Visit: Payer: Self-pay

## 2019-09-02 ENCOUNTER — Encounter (HOSPITAL_COMMUNITY): Payer: Self-pay | Admitting: Orthopedic Surgery

## 2019-09-02 ENCOUNTER — Encounter (HOSPITAL_COMMUNITY): Payer: Self-pay

## 2019-09-02 DIAGNOSIS — L089 Local infection of the skin and subcutaneous tissue, unspecified: Secondary | ICD-10-CM | POA: Diagnosis not present

## 2019-09-02 DIAGNOSIS — W540XXD Bitten by dog, subsequent encounter: Secondary | ICD-10-CM | POA: Diagnosis not present

## 2019-09-02 DIAGNOSIS — M79641 Pain in right hand: Secondary | ICD-10-CM

## 2019-09-02 DIAGNOSIS — S61451A Open bite of right hand, initial encounter: Secondary | ICD-10-CM | POA: Diagnosis not present

## 2019-09-02 MED ORDER — IBUPROFEN 400 MG PO TABS: 400.0000 mg | ORAL_TABLET | Freq: Four times a day (QID) | ORAL | 0 refills | Status: DC | PRN

## 2019-09-02 MED ORDER — AMOXICILLIN-POT CLAVULANATE 875-125 MG PO TABS: 1.0000 | ORAL_TABLET | Freq: Two times a day (BID) | ORAL | 0 refills | Status: DC

## 2019-09-02 MED ORDER — DOXYCYCLINE HYCLATE 100 MG PO CAPS: 100.0000 mg | ORAL_CAPSULE | Freq: Two times a day (BID) | ORAL | 0 refills | Status: AC

## 2019-09-02 MED FILL — IBUPROFEN 400 MG TABS: 400 | 7 days supply | Qty: 30 | Fill #0

## 2019-09-02 MED FILL — DOXYCYCLINE HYCLATE 100 MG: 100 | 5 days supply | Qty: 10 | Fill #0

## 2019-09-02 MED FILL — AMOX-CLAV 875-125 MG TABLET: 875-125 | 3 days supply | Qty: 6 | Fill #0

## 2019-09-02 NOTE — Discharge Instructions (Addendum)
Of call the orthopedic office that you have requested and they will contact you for first available appointment.  If you are unable to be seen before the weekend and you have worsening swelling and pain or worsening drainage please go to the emergency department  Begin taking the doxycycline today, and I have also also sent in extension of your Augmentin to be continued on Monday for 3 more days.  You may also take 400 mg of ibuprofen every 6 hours at this point.

## 2019-09-02 NOTE — Telephone Encounter (Signed)
Patient called back with informationon  Dr.Gary Kuzma at the [facility] number [phone]. [address]

## 2019-09-02 NOTE — Telephone Encounter (Signed)
Patient called today She was seen at the UC this morning and they scheduled her a visit with hand specialist in [place] @ 2:45pm today She is needing a referral placed for the appointment   Wynonia Sours, MD  [place]

## 2019-09-02 NOTE — ED Triage Notes (Signed)
Pt present animal bite on her right hand. She came in on last Sunday 08/29/19. The dog is up to date on there shots. Pt hand is swollen and drainage pus.

## 2019-09-02 NOTE — ED Provider Notes (Signed)
[place]    CSN: [id number] Arrival date & time: 09/02/19  1039      History   Chief Complaint Chief Complaint  Patient presents with  . Animal Bite    right hand     HPI Sue [name] is a 59 y.o. female.   She reports urgent care for follow-up of recent dog bite and injury.  She reports there is been some increased swelling and some signs of infection include some redness and discharge.  She was bitten on 08/29/2019 by her own dog at home and seen in this urgent care.  Wound was cleaned and loose proximation of one laceration was done.  She was placed on Augmentin.  X-rays were negative for fracture at the time.  She reports she has had episodes of swelling and pain over the last couple days.  She has had purulent looking discharge on her dressings.  She denies fever and chills.  She reports limited ability to make a fist due to pain.  She has not had any redness going up her arm.     Past Medical History:  Diagnosis Date  . Anxiety   . Hypertension   . Hypothyroid     Patient Active Problem List   Diagnosis Date Noted  . GERD (gastroesophageal reflux disease) 07/06/2019  . Insomnia 07/06/2019  . Hypothyroid   . Hypertension   . Anxiety     Past Surgical History:  Procedure Laterality Date  . TONSILLECTOMY  3rd grade  . UPPER ESOPHAGEAL ENDOSCOPIC ULTRASOUND (EUS) N/A 02/25/2017   Procedure: UPPER ESOPHAGEAL ENDOSCOPIC ULTRASOUND (EUS);  Surgeon: Carol Ada, MD;  Location: [facility];  Service: Endoscopy;  Laterality: N/A;    OB History    Gravida  0   Para  0   Term  0   Preterm  0   AB  0   Living  0     SAB  0   TAB  0   Ectopic  0   Multiple  0   Live Births               Home Medications    Prior to Admission medications   Medication Sig Start Date End Date Taking? Authorizing Provider  amoxicillin-clavulanate (AUGMENTIN) 875-125 MG tablet Take 1 tablet by mouth 2 (two) times daily for 7 days. 08/29/19 09/05/19   Sue [name], Sue Beards, PA-C  amoxicillin-clavulanate (AUGMENTIN) 875-125 MG tablet Take 1 tablet by mouth 2 (two) times daily for 3 days. 09/06/19 09/09/19  Sue [name], Sue Beards, PA-C  doxycycline (VIBRAMYCIN) 100 MG capsule Take 1 capsule (100 mg total) by mouth 2 (two) times daily for 5 days. 09/02/19 09/07/19  Sue [name], Sue Beards, PA-C  ibuprofen (ADVIL) 400 MG tablet Take 1 tablet (400 mg total) by mouth every 6 (six) hours as needed. 09/02/19   Sue [name], Sue Beards, PA-C  levothyroxine (SYNTHROID) 25 MCG tablet Take 1 tablet (25 mcg total) by mouth daily before breakfast. 07/06/19   Sue Fenton, NP  losartan-hydrochlorothiazide (HYZAAR) 50-12.5 MG tablet Take 1 tablet by mouth daily. 07/06/19   Sue Fenton, NP  Multiple Vitamin (MULTIVITAMIN WITH MINERALS) TABS tablet Take 1 tablet by mouth daily.    [provider]  omeprazole (PRILOSEC) 20 MG capsule Take 20 mg by mouth 2 (two) times daily. 03/16/18   [provider]  sertraline (ZOLOFT) 100 MG tablet  07/21/18   [provider]  traZODone (DESYREL) 50 MG tablet Take 0.5-1 tablets (25-50  mg total) by mouth at bedtime as needed for sleep. 07/06/19   Sue Fenton, NP    Family History Family History  Problem Relation Age of Onset  . Uterine cancer Maternal Grandmother   . Colon cancer Other        Paternal Event organiser  . Breast cancer Other        Maternal Great Grandmother  . Hypertension Father   . Heart attack Father   . Hypertension Brother   . Lupus Mother   . Osteoporosis Mother     Social History Social History   Tobacco Use  . Smoking status: Never Smoker  . Smokeless tobacco: Never Used  Substance Use Topics  . Alcohol use: Yes    Alcohol/week: 4.0 standard drinks    Types: 4 Glasses of wine per week  . Drug use: No     Allergies   Oxycodone   Review of Systems Review of Systems  Constitutional: Negative for chills and fever.  Musculoskeletal:       See HPI  Skin:       See HPI     Physical Exam  Triage Vital Signs ED Triage Vitals  Enc Vitals Group     BP 09/02/19 1059 134/85     Pulse Rate 09/02/19 1059 (!) 56     Resp --      Temp 09/02/19 1059 98.1 F (36.7 C)     Temp Source 09/02/19 1059 Oral     SpO2 09/02/19 1059 99 %     Weight --      Height --      Head Circumference --      Peak Flow --      Pain Score 09/02/19 1100 0     Pain Loc --      Pain Edu? --      Excl. in [place]? --    No data found.  Updated Vital Signs BP 134/85 (BP Location: Left Arm)   Pulse (!) 56   Temp 98.1 F (36.7 C) (Oral)   LMP 10/30/2012   SpO2 99%   Visual Acuity Right Eye Distance:   Left Eye Distance:   Bilateral Distance:    Right Eye Near:   Left Eye Near:    Bilateral Near:     Physical Exam Constitutional:      General: She is not in acute distress.    Appearance: Normal appearance. She is not ill-appearing.  Cardiovascular:     Rate and Rhythm: Normal rate.  Pulmonary:     Effort: Pulmonary effort is normal. No respiratory distress.  Musculoskeletal:     Comments: Right hand with healing bite marks.  There is yellow crusting with small evidence of yellow discharge in one of the smaller puncture wounds.  Larger laceration appears to be well-healing with no obvious sign of infection with the exception of possible small amount of erythema one of the wound edges.  There is swelling of the dorsum of the hand and is tender to palpation.  There is swelling and for lesser digits.  Patient able to loosely close her fist but is limited due to pain and swelling.  Pain with complete passive closure of the fist.  No spreading erythema up the right arm or lymphangitis.  Skin:    General: Skin is warm and dry.  Neurological:     General: No focal deficit present.     Mental Status: She is alert and oriented to person, place, and  time.      UC Treatments / Results  Labs (all labs ordered are listed, but only abnormal results are displayed) Labs Reviewed - No data to display   EKG   Radiology No results found.  Procedures Procedures (including critical care time)  Medications Ordered in UC Medications - No data to display  Initial Impression / Assessment and Plan / UC Course  I have reviewed the triage vital signs and the nursing notes.  Pertinent labs & imaging results that were available during my care of the patient were reviewed by me and considered in my medical decision making (see chart for details).     # dog bite follow-up Patient is a 49 year old presenting for reevaluation of recent dog bite injury.  There is evidence of swelling and possible infection though she has been on Augmentin.  Discussed with patient that I would like for her to follow-up with hand at this point.  Was able to secure appointment at 245 with the hand center.  Patient was started on doxycycline as well to cover possible MRSA.  As well I extended her Augmentin. -Patient to follow-up with orthopedic hand today to help rule out deeper infection or possible extensor tendon apparatus infection or damage Final Clinical Impressions(s) / UC Diagnoses   Final diagnoses:  Dog bite, subsequent encounter     Discharge Instructions     Of call the orthopedic office that you have requested and they will contact you for first available appointment.  If you are unable to be seen before the weekend and you have worsening swelling and pain or worsening drainage please go to the emergency department  Begin taking the doxycycline today, and I have also also sent in extension of your Augmentin to be continued on Monday for 3 more days.  You may also take 400 mg of ibuprofen every 6 hours at this point.    ED Prescriptions    Medication Sig Dispense Auth. Provider   amoxicillin-clavulanate (AUGMENTIN) 875-125 MG tablet Take 1 tablet by mouth 2 (two) times daily for 3 days. 6 tablet Yaminah Clayborn, Sue Beards, PA-C   doxycycline (VIBRAMYCIN) 100 MG capsule Take 1 capsule (100 mg total) by mouth  2 (two) times daily for 5 days. 10 capsule Tannya Gonet, Sue Beards, PA-C   ibuprofen (ADVIL) 400 MG tablet Take 1 tablet (400 mg total) by mouth every 6 (six) hours as needed. 30 tablet Eulonda Andalon, Sue Beards, PA-C     PDMP not reviewed this encounter.   Purnell Shoemaker, PA-C 09/02/19 1222

## 2019-09-02 NOTE — Addendum Note (Signed)
Addended by: [name] on: 09/02/2019 01:12 PM   Modules accepted: Orders

## 2019-09-03 ENCOUNTER — Encounter (HOSPITAL_COMMUNITY)
Admission: RE | Disposition: A | Discharge status: Home / Self Care | Payer: Self-pay | Source: Home / Self Care | Attending: Orthopedic Surgery

## 2019-09-03 ENCOUNTER — Ambulatory Visit (HOSPITAL_COMMUNITY): Payer: No Typology Code available for payment source | Admitting: Certified Registered Nurse Anesthetist

## 2019-09-03 ENCOUNTER — Other Ambulatory Visit: Payer: Self-pay

## 2019-09-03 ENCOUNTER — Other Ambulatory Visit (HOSPITAL_COMMUNITY): Payer: No Typology Code available for payment source

## 2019-09-03 ENCOUNTER — Ambulatory Visit (HOSPITAL_COMMUNITY)
Admission: RE | Admit: 2019-09-03 | Discharge: 2019-09-03 | Disposition: A | Discharge status: Home / Self Care | Payer: No Typology Code available for payment source | Attending: Orthopedic Surgery | Admitting: Orthopedic Surgery

## 2019-09-03 ENCOUNTER — Encounter (HOSPITAL_COMMUNITY): Payer: Self-pay | Admitting: Orthopedic Surgery

## 2019-09-03 DIAGNOSIS — E039 Hypothyroidism, unspecified: Secondary | ICD-10-CM | POA: Diagnosis not present

## 2019-09-03 DIAGNOSIS — Z8249 Family history of ischemic heart disease and other diseases of the circulatory system: Secondary | ICD-10-CM | POA: Insufficient documentation

## 2019-09-03 DIAGNOSIS — S61451A Open bite of right hand, initial encounter: Secondary | ICD-10-CM | POA: Insufficient documentation

## 2019-09-03 DIAGNOSIS — W540XXA Bitten by dog, initial encounter: Secondary | ICD-10-CM | POA: Insufficient documentation

## 2019-09-03 DIAGNOSIS — Z885 Allergy status to narcotic agent status: Secondary | ICD-10-CM | POA: Diagnosis not present

## 2019-09-03 DIAGNOSIS — I10 Essential (primary) hypertension: Secondary | ICD-10-CM | POA: Insufficient documentation

## 2019-09-03 DIAGNOSIS — M1990 Unspecified osteoarthritis, unspecified site: Secondary | ICD-10-CM | POA: Diagnosis not present

## 2019-09-03 DIAGNOSIS — K219 Gastro-esophageal reflux disease without esophagitis: Secondary | ICD-10-CM | POA: Diagnosis not present

## 2019-09-03 DIAGNOSIS — Z20822 Contact with and (suspected) exposure to covid-19: Secondary | ICD-10-CM | POA: Diagnosis not present

## 2019-09-03 HISTORY — PX: INCISION AND DRAINAGE: SHX5863

## 2019-09-03 HISTORY — DX: Unspecified osteoarthritis, unspecified site: M19.90

## 2019-09-03 HISTORY — DX: Gastro-esophageal reflux disease without esophagitis: K21.9

## 2019-09-03 LAB — BASIC METABOLIC PANEL
Anion gap: 11 (ref 5–15)
BUN: 15 mg/dL (ref 6–20)
CO2: 24 mmol/L (ref 22–32)
Calcium: 9.4 mg/dL (ref 8.9–10.3)
Chloride: 105 mmol/L (ref 98–111)
Creatinine, Ser: 0.55 mg/dL (ref 0.44–1.00)
GFR calc Af Amer: >60 mL/min (ref >60)
GFR calc non Af Amer: >60 mL/min (ref >60)
Glucose, Bld: 91 mg/dL (ref 70–99)
Potassium: 4 mmol/L (ref 3.5–5.1)
Sodium: 140 mmol/L (ref 135–145)

## 2019-09-03 LAB — RESPIRATORY PANEL BY RT PCR (FLU A&B, COVID)
Influenza A by PCR: NEGATIVE
Influenza B by PCR: NEGATIVE
SARS Coronavirus 2 by RT PCR: NEGATIVE

## 2019-09-03 LAB — HEMOGLOBIN: Hemoglobin: 12.5 g/dL (ref 12.0–15.0)

## 2019-09-03 SURGERY — INCISION AND DRAINAGE
Anesthesia: General | Site: Hand | Laterality: Right

## 2019-09-03 MED ORDER — FENTANYL CITRATE (PF) 100 MCG/2ML IJ SOLN
INTRAMUSCULAR | Status: AC
  Filled 2019-09-03: qty 2

## 2019-09-03 MED ORDER — SODIUM CHLORIDE 0.9 % IV SOLN
INTRAVENOUS | Status: DC | PRN
  Administered 2019-09-03: 500 mL

## 2019-09-03 MED ORDER — MIDAZOLAM HCL 2 MG/2ML IJ SOLN
INTRAMUSCULAR | Status: AC
  Filled 2019-09-03: qty 2

## 2019-09-03 MED ORDER — SODIUM CHLORIDE 0.9 % IR SOLN
Status: DC | PRN
  Administered 2019-09-03: 1

## 2019-09-03 MED ORDER — OXYCODONE HCL 5 MG/5ML PO SOLN: 5.0000 mg | Freq: Once | ORAL | Status: AC | PRN

## 2019-09-03 MED ORDER — LACTATED RINGERS IV SOLN: INTRAVENOUS | Status: DC

## 2019-09-03 MED ORDER — ONDANSETRON HCL 4 MG/2ML IJ SOLN: 4.0000 mg | Freq: Once | INTRAMUSCULAR | Status: DC | PRN

## 2019-09-03 MED ORDER — FENTANYL CITRATE (PF) 250 MCG/5ML IJ SOLN
INTRAMUSCULAR | Status: DC | PRN
  Administered 2019-09-03: 50 ug via INTRAVENOUS
  Administered 2019-09-03 (×4): 25 ug via INTRAVENOUS

## 2019-09-03 MED ORDER — DEXAMETHASONE SODIUM PHOSPHATE 10 MG/ML IJ SOLN
INTRAMUSCULAR | Status: AC
  Filled 2019-09-03: qty 1

## 2019-09-03 MED ORDER — CEFAZOLIN SODIUM-DEXTROSE 2-4 GM/100ML-% IV SOLN
2.0000 g | INTRAVENOUS | Status: AC
  Administered 2019-09-03: 2 g via INTRAVENOUS
  Filled 2019-09-03: qty 100

## 2019-09-03 MED ORDER — PROPOFOL 10 MG/ML IV BOLUS
INTRAVENOUS | Status: DC | PRN
  Administered 2019-09-03: 100 mg via INTRAVENOUS
  Administered 2019-09-03: 200 mg via INTRAVENOUS

## 2019-09-03 MED ORDER — ONDANSETRON HCL 4 MG/2ML IJ SOLN
INTRAMUSCULAR | Status: DC | PRN
  Administered 2019-09-03: 4 mg via INTRAVENOUS

## 2019-09-03 MED ORDER — MIDAZOLAM HCL 5 MG/5ML IJ SOLN
INTRAMUSCULAR | Status: DC | PRN
  Administered 2019-09-03: 2 mg via INTRAVENOUS

## 2019-09-03 MED ORDER — PROPOFOL 10 MG/ML IV BOLUS
INTRAVENOUS | Status: AC
  Filled 2019-09-03: qty 20

## 2019-09-03 MED ORDER — FENTANYL CITRATE (PF) 250 MCG/5ML IJ SOLN
INTRAMUSCULAR | Status: AC
  Filled 2019-09-03: qty 5

## 2019-09-03 MED ORDER — LIDOCAINE 2% (20 MG/ML) 5 ML SYRINGE
INTRAMUSCULAR | Status: DC | PRN
  Administered 2019-09-03: 80 mg via INTRAVENOUS

## 2019-09-03 MED ORDER — OXYCODONE HCL 5 MG PO TABS
ORAL_TABLET | ORAL | Status: AC
  Filled 2019-09-03: qty 1

## 2019-09-03 MED ORDER — SODIUM CHLORIDE 0.9 % IV SOLN
INTRAVENOUS | Status: AC
  Filled 2019-09-03: qty 500000

## 2019-09-03 MED ORDER — TRAMADOL HCL 50 MG PO TABS: 50.0000 mg | ORAL_TABLET | Freq: Four times a day (QID) | ORAL | 0 refills | Status: DC | PRN

## 2019-09-03 MED ORDER — FENTANYL CITRATE (PF) 100 MCG/2ML IJ SOLN
25.0000 ug | INTRAMUSCULAR | Status: DC | PRN
  Administered 2019-09-03 (×2): 50 ug via INTRAVENOUS

## 2019-09-03 MED ORDER — OXYCODONE HCL 5 MG PO TABS
5.0000 mg | ORAL_TABLET | Freq: Once | ORAL | Status: AC | PRN
  Administered 2019-09-03: 5 mg via ORAL

## 2019-09-03 MED ORDER — ONDANSETRON HCL 4 MG/2ML IJ SOLN
INTRAMUSCULAR | Status: AC
  Filled 2019-09-03: qty 2

## 2019-09-03 MED ORDER — PHENYLEPHRINE 40 MCG/ML (10ML) SYRINGE FOR IV PUSH (FOR BLOOD PRESSURE SUPPORT)
PREFILLED_SYRINGE | INTRAVENOUS | Status: AC
  Filled 2019-09-03: qty 10

## 2019-09-03 MED FILL — traMADol HCL 50 MG TABS: 50 | 5 days supply | Qty: 20 | Fill #0

## 2019-09-03 SURGICAL SUPPLY — 33 items
BNDG CMPR 9X4 STRL LF SNTH (GAUZE/BANDAGES/DRESSINGS) ×1
BNDG COHESIVE 3X5 TAN STRL LF (GAUZE/BANDAGES/DRESSINGS) ×1 IMPLANT
BNDG ELASTIC 4X5.8 VLCR STR LF (GAUZE/BANDAGES/DRESSINGS) ×2 IMPLANT
BNDG ESMARK 4X9 LF (GAUZE/BANDAGES/DRESSINGS) ×2 IMPLANT
BNDG GAUZE ELAST 4 BULKY (GAUZE/BANDAGES/DRESSINGS) ×2 IMPLANT
CORD BIPOLAR FORCEPS 12FT (ELECTRODE) ×2 IMPLANT
COVER WAND RF STERILE (DRAPES) ×1 IMPLANT
CUFF TOURN SGL QUICK 18X4 (TOURNIQUET CUFF) ×2 IMPLANT
CUFF TOURN SGL QUICK 24 (TOURNIQUET CUFF)
CUFF TRNQT CYL 24X4X16.5-23 (TOURNIQUET CUFF) IMPLANT
DRSG ADAPTIC 3X8 NADH LF (GAUZE/BANDAGES/DRESSINGS) ×2 IMPLANT
DRSG EMULSION OIL 3X3 NADH (GAUZE/BANDAGES/DRESSINGS) ×1 IMPLANT
DRSG PAD ABDOMINAL 8X10 ST (GAUZE/BANDAGES/DRESSINGS) ×2 IMPLANT
GAUZE PACKING IODOFORM 1/4X15 (GAUZE/BANDAGES/DRESSINGS) ×1 IMPLANT
GAUZE SPONGE 4X4 12PLY STRL (GAUZE/BANDAGES/DRESSINGS) ×2 IMPLANT
GAUZE XEROFORM 1X8 LF (GAUZE/BANDAGES/DRESSINGS) ×1 IMPLANT
HANDPIECE INTERPULSE COAX TIP (DISPOSABLE)
KIT BASIN OR (CUSTOM PROCEDURE TRAY) ×2 IMPLANT
KIT TURNOVER KIT B (KITS) ×2 IMPLANT
MANIFOLD NEPTUNE II (INSTRUMENTS) ×1 IMPLANT
NS IRRIG 1000ML POUR BTL (IV SOLUTION) ×2 IMPLANT
PACK ORTHO EXTREMITY (CUSTOM PROCEDURE TRAY) ×2 IMPLANT
PAD ARMBOARD 7.5X6 YLW CONV (MISCELLANEOUS) ×4 IMPLANT
SET HNDPC FAN SPRY TIP SCT (DISPOSABLE) IMPLANT
SPONGE LAP 18X18 RF (DISPOSABLE) ×2 IMPLANT
SPONGE LAP 4X18 RFD (DISPOSABLE) ×1 IMPLANT
SWAB CULTURE ESWAB REG 1ML (MISCELLANEOUS) IMPLANT
TOWEL GREEN STERILE (TOWEL DISPOSABLE) ×2 IMPLANT
TOWEL GREEN STERILE FF (TOWEL DISPOSABLE) ×2 IMPLANT
TUBE CONNECTING 12X1/4 (SUCTIONS) ×2 IMPLANT
UNDERPAD 30X30 (UNDERPADS AND DIAPERS) ×2 IMPLANT
WATER STERILE IRR 1000ML POUR (IV SOLUTION) ×2 IMPLANT
YANKAUER SUCT BULB TIP NO VENT (SUCTIONS) ×2 IMPLANT

## 2019-09-03 NOTE — Anesthesia Procedure Notes (Signed)
Procedure Name: LMA Insertion Date/Time: 09/03/2019 1:12 PM Performed by: Janene Harvey, CRNA Pre-anesthesia Checklist: Patient identified, Emergency Drugs available, Suction available and Patient being monitored Patient Re-evaluated:Patient Re-evaluated prior to induction Oxygen Delivery Method: Circle system utilized Preoxygenation: Pre-oxygenation with 100% oxygen Induction Type: IV induction LMA: LMA inserted LMA Size: 4.0 Dental Injury: Teeth and Oropharynx as per pre-operative assessment

## 2019-09-03 NOTE — Anesthesia Postprocedure Evaluation (Signed)
Anesthesia Post Note  Patient: Reika E Bigos  Procedure(s) Performed: INCISION AND DRAINAGE DOG BITE WOUND RIGHT HAND (Right Hand)     Patient location during evaluation: PACU Anesthesia Type: General Level of consciousness: awake and alert, oriented and patient cooperative Pain management: pain level controlled Vital Signs Assessment: post-procedure vital signs reviewed and stable Respiratory status: spontaneous breathing, nonlabored ventilation and respiratory function stable Cardiovascular status: blood pressure returned to baseline and stable Postop Assessment: no apparent nausea or vomiting Anesthetic complications: no    Last Vitals:  Vitals:   09/03/19 1424 09/03/19 1439  BP: (!) 144/83 133/82  Pulse: (!) 54 (!) 54  Resp: 10 15  Temp:  37 C  SpO2: 99% 96%    Last Pain:  Vitals:   09/03/19 1439  PainSc: 3                  Euva Rundell,E. Maika Kaczmarek

## 2019-09-03 NOTE — Progress Notes (Signed)
Orthopedic Tech Progress Note Patient Details:  Sue [name] 08-14-1960 [id number] PACU RN called requesting an [place] for patient. Ortho Devices Type of Ortho Device: Arm sling Ortho Device/Splint Location: RUE Ortho Device/Splint Interventions: Ordered, Application   Post Interventions Patient Tolerated: Well Instructions Provided: Care of device   Janit Pagan 09/03/2019, 3:05 PM

## 2019-09-03 NOTE — Discharge Instructions (Addendum)

## 2019-09-03 NOTE — Op Note (Addendum)
NAME: Sue [name] MEDICAL RECORD NO: [id number] DATE OF BIRTH: September 25, 1960 FACILITY: [facility] LOCATION: [facility] OR PHYSICIAN: Wynonia Sours, MD   OPERATIVE REPORT   DATE OF PROCEDURE: 09/03/19    PREOPERATIVE DIAGNOSIS:   Multiple dog bites right hand   POSTOPERATIVE DIAGNOSIS:   Same   PROCEDURE:   Incision and drainage 5 wounds right hand   SURGEON: Daryll Brod, M.D.   ASSISTANT: none   ANESTHESIA:  General   INTRAVENOUS FLUIDS:  Per anesthesia flow sheet.   ESTIMATED BLOOD LOSS:  Minimal.   COMPLICATIONS:  None.   SPECIMENS:   Cultures   TOURNIQUET TIME:    Total Tourniquet Time Documented: Upper Arm (Right) - 14 minutes Total: Upper Arm (Right) - 14 minutes    DISPOSITION:  Stable to PACU.   INDICATIONS: Patient is a 59 year old female who was bitten by her own dog approximately 5 days ago.  She has had progressive pain and swelling despite being on Augmentin and adding doxycycline.  She states the swelling is gotten progressively worse along with pain.  She has bite wounds of both dorsal and palmar aspects of her right hand and in the wrist area.  He is admitted for incision and drainage packing and cultures.  The operative area the patient is seen the extremity marked by both patient and surgeon she is aware there is no guarantee to the surgery the possibility of infection recurrence injury to arteries nerves tendons complete release symptoms dystrophy.  OPERATIVE COURSE: Patient is brought to the operating room placed in a supine position with the right arm free.  A general anesthetic was afforded by the anesthesia department.  She was prepped using ChloraPrep in the supine position due to the wounds at sealed.  The right arm was free.  A 3-minute dry time was allowed and timeout taken confirming patient procedure.  The limb was exsanguinated for the nurse proximally and a tourniquet placed on the upper arm inflated to 250 mmHg.  The dorsal wounds were attended to first  large transverse gaping laceration over the base of the first second metacarpal was opened this was then debrided with sharp dissection using a scalpel 3cm in length.  The bite was followed down not involving the bones.  Extensor tendons of the wrist were identified.  The second wound on the ulnar aspect was then opened 1.5 cm.  Again this was debrided with sharp dissection.  This was followed down to its base.  Which went down to the extensor tendons of the ring and small fingers.  The bone was not involved.  This was minimally debrided.  The third wound over the dorsum of the index metacarpal area 1 cm had to bite areas these were connected and that they were very close.  The margins were debrided with sharp dissection using a scalpel.  The dissection carried down to the base and the bone was again not involved but the extensor tendons were visible.  A wound on the hypothenar eminence ulnarly was opened 1.5 cm.  This was then explored found to go into muscle.  The third wound over the flexor carpi ulnaris tendon area the wrist was opened.  This was extended slightly to enlarge this.  This was followed down with out involvement of the flexor tendon.  Each of the wounds were then copiously irrigated with saline.  Was were taken at the very beginning of the case from the first wound and that there was a moderate amount of fluid  in each one of the wounds this was done for both aerobic and anaerobic cultures.  Following the irrigation the each wounds were each packed with iodoform gauze.  A sterile compressive dressing to the hand was applied.  On deflation of the tourniquet all fingers immediately pink.  She was taken to the recovery room for observation in satisfactory condition.  She will be discharged home to return the hand center [facility] on Monday she will maintain be maintained on her Augmentin and doxycycline.  She is given a prescription for tramadol.  She will try Tylenol ibuprofen first.  Daryll Brod,  MD Electronically signed, 09/03/19

## 2019-09-03 NOTE — H&P (Addendum)
Sue [name] is an 59 y.o. female.   Chief Complaint:dog bites right handHPI:Sue [name] is a 59 year old right-hand-dominant former patient in for evaluation of a dog bite right hand. She was seen yesterday by Leverne Humbles, PAC. Comes in today to have this reviewed for the possibility of surgical intervention. She has been seen in the er and placed on antibiotics but has continued swelling. The dog bite occurred on 08/29/2019. She was placed on Augmentin. She was resean on 09/02/2019 and noted to have increased swelling. She had    Doxycycline  added  and referred and seen by our PA.  X-rays revealed scattered swelling  she has no allergies, and questionable gas.    Past Medical History:  Diagnosis Date  . Anxiety   . Arthritis    Hands  . GERD (gastroesophageal reflux disease)   . Hypertension   . Hypothyroid     Past Surgical History:  Procedure Laterality Date  . TONSILLECTOMY  3rd grade  . UPPER ESOPHAGEAL ENDOSCOPIC ULTRASOUND (EUS) N/A 02/25/2017   Procedure: UPPER ESOPHAGEAL ENDOSCOPIC ULTRASOUND (EUS);  Surgeon: Carol Ada, MD;  Location: [facility];  Service: Endoscopy;  Laterality: N/A;    Family History  Problem Relation Age of Onset  . Uterine cancer Maternal Grandmother   . Colon cancer Other        Paternal Event organiser  . Breast cancer Other        Maternal Great Grandmother  . Hypertension Father   . Heart attack Father   . Hypertension Brother   . Lupus Mother   . Osteoporosis Mother    Social History:  reports that she has never smoked. She has never used smokeless tobacco. She reports previous alcohol use. She reports that she does not use drugs.  Allergies:  Allergies  Allergen Reactions  . Oxycodone Itching    No medications prior to admission.  Med motrin prilosec throid replacement cozar No results found for this or any previous visit (from the past 44 hour(s)).  No results found.   Pertinent items are noted in HPI.  Last menstrual  period 10/30/2012.  General appearance: alert, cooperative and appears stated age Head: Normocephalic, without obvious abnormality Neck: no JVD Resp: clear to auscultation bilaterally Cardio: regular rate and rhythm, S1, S2 normal, no murmur, click, rub or gallop GI: soft, non-tender; bowel sounds normal; no masses,  no organomegaly Extremities: edema dog bites right hand and erythema Pulses: 2+ and symmetric Skin: Skin color, texture, turgor normal. No rashes or lesions Neurologic: Grossly normal Incision/Wound: sealed  Assessment/Plan Assessment:  1. Dog bite of right hand with infection,    Plan: We would recommend proceeding with I&D of each of the wounds.'s are all closed. This will be scheduled as an outpatient under regional anesthesia.     Daryll Brod 09/03/2019, 8:48 AM

## 2019-09-03 NOTE — Anesthesia Preprocedure Evaluation (Addendum)
Anesthesia Evaluation  Patient identified by MRN, date of birth, ID band Patient awake    Reviewed: Allergy & Precautions, NPO status , Patient's Chart, lab work & pertinent test results  History of Anesthesia Complications Negative for: history of anesthetic complications  Airway Mallampati: II  TM Distance: >3 FB Neck ROM: Full    Dental no notable dental hx.    Pulmonary neg pulmonary ROS,    Pulmonary exam normal        Cardiovascular hypertension, Pt. on medications Normal cardiovascular exam     Neuro/Psych Anxiety negative neurological ROS     GI/Hepatic Neg liver ROS, GERD  Medicated and Controlled,  Endo/Other  Hypothyroidism   Renal/GU negative Renal ROS  negative genitourinary   Musculoskeletal  (+) Arthritis ,   Abdominal   Peds  Hematology negative hematology ROS (+)   Anesthesia Other Findings Day of surgery medications reviewed with patient.  Reproductive/Obstetrics negative OB ROS                            Anesthesia Physical Anesthesia Plan  ASA: II  Anesthesia Plan: General   Post-op Pain Management:    Induction: Intravenous  PONV Risk Score and Plan: 4 or greater and Treatment may vary due to age or medical condition, Ondansetron, Dexamethasone and Midazolam  Airway Management Planned: LMA  Additional Equipment: None  Intra-op Plan:   Post-operative Plan: Extubation in OR  Informed Consent: I have reviewed the patients History and Physical, chart, labs and discussed the procedure including the risks, benefits and alternatives for the proposed anesthesia with the patient or authorized representative who has indicated his/her understanding and acceptance.     Dental advisory given  Plan Discussed with: CRNA  Anesthesia Plan Comments:        Anesthesia Quick Evaluation

## 2019-09-03 NOTE — Brief Op Note (Signed)
09/03/2019  1:40 PM  PATIENT:  Sue [name]  59 y.o. female  PRE-OPERATIVE DIAGNOSIS:  DOG BITE WOUND RIGHT HAND  POST-OPERATIVE DIAGNOSIS:  DOG BITE WOUND RIGHT HAND  PROCEDURE:  Procedure(s) with comments: INCISION AND DRAINAGE DOG BITE WOUND RIGHT HAND (Right) - IV REGIONAL FOREARM BLOCK  SURGEON:  Surgeon(s) and Role:    * Daryll Brod, MD - Primary  PHYSICIAN ASSISTANT:   ASSISTANTS: none   ANESTHESIA:   general  EBL:  10 mL   BLOOD ADMINISTERED:none  DRAINS: Iodoform gauze to 5 wounds   LOCAL MEDICATIONS USED:  NONE  SPECIMEN:  Source of Specimen:  Aerobic anaerobic cultures  DISPOSITION OF SPECIMEN:  PATHOLOGY  COUNTS:  YES  TOURNIQUET:   Total Tourniquet Time Documented: Upper Arm (Right) - 14 minutes Total: Upper Arm (Right) - 14 minutes   DICTATION: .Viviann Spare Dictation  PLAN OF CARE: Discharge to home after PACU  PATIENT DISPOSITION:  PACU - hemodynamically stable.

## 2019-09-03 NOTE — Transfer of Care (Signed)
Immediate Anesthesia Transfer of Care Note  Patient: Lyndi E Renovato  Procedure(s) Performed: INCISION AND DRAINAGE DOG BITE WOUND RIGHT HAND (Right Hand)  Patient Location: PACU  Anesthesia Type:General  Level of Consciousness: awake and alert   Airway & Oxygen Therapy: Patient Spontanous Breathing and Patient connected to face mask oxygen  Post-op Assessment: Report given to RN and Post -op Vital signs reviewed and stable  Post vital signs: Reviewed  Last Vitals:  Vitals Value Taken Time  BP 139/92 09/03/19 1354  Temp    Pulse 67 09/03/19 1359  Resp 14 09/03/19 1359  SpO2 100 % 09/03/19 1359  Vitals shown include unvalidated device data.  Last Pain:  Vitals:   09/03/19 1058  PainSc: 0-No pain      Patients Stated Pain Goal: 5 ([phone] [id number])  Complications: No apparent anesthesia complications

## 2019-09-08 LAB — AEROBIC/ANAEROBIC CULTURE W GRAM STAIN (SURGICAL/DEEP WOUND): Culture: NO GROWTH

## 2019-09-08 MED FILL — traMADol HCL 50 MG TABS: 50 | 8 days supply | Qty: 30 | Fill #0

## 2019-09-17 MED FILL — OMEPRAZOLE DR 20 MG CAPSULE: 20 | 30 days supply | Qty: 60 | Fill #9

## 2019-09-23 ENCOUNTER — Other Ambulatory Visit: Payer: Self-pay | Admitting: Internal Medicine

## 2019-09-23 MED ORDER — SERTRALINE HCL 50 MG PO TABS: 50.0000 mg | ORAL_TABLET | Freq: Every day | ORAL | 0 refills | Status: DC

## 2019-09-23 MED FILL — SERTRALINE HCL 50 MG TABLET: 50 | 90 days supply | Qty: 90 | Fill #0

## 2019-09-29 MED FILL — LOSARTAN-HCTZ 50-12.5 MG TA: 50-12.5 | 90 days supply | Qty: 90 | Fill #1

## 2019-10-18 MED FILL — OMEPRAZOLE DR 20 MG CAPSULE: 20 | 30 days supply | Qty: 60 | Fill #10

## 2019-10-19 ENCOUNTER — Encounter: Payer: Self-pay | Admitting: Internal Medicine

## 2019-10-19 ENCOUNTER — Other Ambulatory Visit: Payer: Self-pay

## 2019-10-19 ENCOUNTER — Telehealth: Payer: Self-pay

## 2019-10-19 ENCOUNTER — Ambulatory Visit (INDEPENDENT_AMBULATORY_CARE_PROVIDER_SITE_OTHER): Payer: No Typology Code available for payment source | Admitting: Internal Medicine

## 2019-10-19 ENCOUNTER — Other Ambulatory Visit (HOSPITAL_COMMUNITY)
Admission: RE | Admit: 2019-10-19 | Discharge: 2019-10-19 | Disposition: A | Discharge status: Home / Self Care | Payer: No Typology Code available for payment source | Source: Ambulatory Visit | Attending: Internal Medicine | Admitting: Internal Medicine

## 2019-10-19 VITALS — BP 126/78 | HR 83 | Temp 97.0°F | Ht 62.33 in | Wt 152.0 lb

## 2019-10-19 DIAGNOSIS — Z8261 Family history of arthritis: Secondary | ICD-10-CM

## 2019-10-19 DIAGNOSIS — Z0000 Encounter for general adult medical examination without abnormal findings: Secondary | ICD-10-CM | POA: Diagnosis not present

## 2019-10-19 DIAGNOSIS — B351 Tinea unguium: Secondary | ICD-10-CM

## 2019-10-19 DIAGNOSIS — E039 Hypothyroidism, unspecified: Secondary | ICD-10-CM

## 2019-10-19 DIAGNOSIS — Z0001 Encounter for general adult medical examination with abnormal findings: Secondary | ICD-10-CM

## 2019-10-19 DIAGNOSIS — Z1231 Encounter for screening mammogram for malignant neoplasm of breast: Secondary | ICD-10-CM

## 2019-10-19 DIAGNOSIS — Z1159 Encounter for screening for other viral diseases: Secondary | ICD-10-CM

## 2019-10-19 DIAGNOSIS — L821 Other seborrheic keratosis: Secondary | ICD-10-CM

## 2019-10-19 DIAGNOSIS — Z780 Asymptomatic menopausal state: Secondary | ICD-10-CM | POA: Diagnosis not present

## 2019-10-19 DIAGNOSIS — Z114 Encounter for screening for human immunodeficiency virus [HIV]: Secondary | ICD-10-CM

## 2019-10-19 DIAGNOSIS — Z124 Encounter for screening for malignant neoplasm of cervix: Secondary | ICD-10-CM | POA: Insufficient documentation

## 2019-10-19 DIAGNOSIS — M151 Heberden's nodes (with arthropathy): Secondary | ICD-10-CM | POA: Diagnosis not present

## 2019-10-19 LAB — COMPREHENSIVE METABOLIC PANEL
ALT: 16 U/L (ref 0–35)
AST: 22 U/L (ref 0–37)
Albumin: 4.6 g/dL (ref 3.5–5.2)
Alkaline Phosphatase: 64 U/L (ref 39–117)
BUN: 10 mg/dL (ref 6–23)
CO2: 30 mEq/L (ref 19–32)
Calcium: 9.8 mg/dL (ref 8.4–10.5)
Chloride: 105 mEq/L (ref 96–112)
Creatinine, Ser: 0.55 mg/dL (ref 0.40–1.20)
GFR: 113.59 mL/min (ref >60.00)
Glucose, Bld: 92 mg/dL (ref 70–99)
Potassium: 3.8 mEq/L (ref 3.5–5.1)
Sodium: 141 mEq/L (ref 135–145)
Total Bilirubin: 0.4 mg/dL (ref 0.2–1.2)
Total Protein: 7.2 g/dL (ref 6.0–8.3)

## 2019-10-19 LAB — LIPID PANEL
Cholesterol: 257 mg/dL — ABNORMAL HIGH (ref 0–200)
HDL: 69.4 mg/dL (ref >39.00)
LDL Cholesterol: 169 mg/dL — ABNORMAL HIGH (ref 0–99)
NonHDL: 188.02
Total CHOL/HDL Ratio: 4
Triglycerides: 93 mg/dL (ref 0.0–149.0)
VLDL: 18.6 mg/dL (ref 0.0–40.0)

## 2019-10-19 LAB — CBC
HCT: 38.2 % (ref 36.0–46.0)
Hemoglobin: 12.7 g/dL (ref 12.0–15.0)
MCHC: 33.2 g/dL (ref 30.0–36.0)
MCV: 88.5 fl (ref 78.0–100.0)
Platelets: 312 10*3/uL (ref 150.0–400.0)
RBC: 4.32 Mil/uL (ref 3.87–5.11)
RDW: 13.4 % (ref 11.5–15.5)
WBC: 7.3 10*3/uL (ref 4.0–10.5)

## 2019-10-19 LAB — HIGH SENSITIVITY CRP: CRP, High Sensitivity: 2.19 mg/L (ref 0.000–5.000)

## 2019-10-19 LAB — SEDIMENTATION RATE: Sed Rate: 7 mm/hr (ref 0–30)

## 2019-10-19 NOTE — Progress Notes (Signed)
Subjective:    Patient ID: Sue [name], female    DOB: 03/28/1961, 59 y.o.   MRN: [id number]  HPI  Patient presents to the clinic today for her annual exam.  Flu: 03/2019 Tetanus: 08/2019 Covid: 06/2019, 07/2019 Pap smear: > 5 years ago Mammogram: 02/2018 Bone density: Never Colon screen: 2016 Vision screening: annually Dental screening biannually  Diet: She does eat meat. She consumes fruits and veggies daily. She tries to avoid fried foods. She drinks mostly unsweet tea, Monster, Dt. Soda, water. Exercise: Walking the dogs for 1 hour in the morning  Review of Systems  Past Medical History:  Diagnosis Date  . Anxiety   . Arthritis    Hands  . GERD (gastroesophageal reflux disease)   . Hypertension   . Hypothyroid     Current Outpatient Medications  Medication Sig Dispense Refill  . ibuprofen (ADVIL) 400 MG tablet Take 1 tablet (400 mg total) by mouth every 6 (six) hours as needed. 30 tablet 0  . levothyroxine (SYNTHROID) 25 MCG tablet Take 1 tablet (25 mcg total) by mouth daily before breakfast. 90 tablet 1  . losartan-hydrochlorothiazide (HYZAAR) 50-12.5 MG tablet Take 1 tablet by mouth daily. 90 tablet 1  . Multiple Vitamin (MULTIVITAMIN WITH MINERALS) TABS tablet Take 1 tablet by mouth daily.    Marland Kitchen omeprazole (PRILOSEC) 20 MG capsule Take 20 mg by mouth 2 (two) times daily.  10  . sertraline (ZOLOFT) 100 MG tablet Take 100 mg by mouth daily.     . sertraline (ZOLOFT) 50 MG tablet Take 1 tablet (50 mg total) by mouth daily. 90 tablet 0  . traMADol (ULTRAM) 50 MG tablet Take 1 tablet (50 mg total) by mouth every 6 (six) hours as needed. 20 tablet 0  . traZODone (DESYREL) 50 MG tablet Take 0.5-1 tablets (25-50 mg total) by mouth at bedtime as needed for sleep. 30 tablet 1   No current facility-administered medications for this visit.    No Known Allergies  Family History  Problem Relation Age of Onset  . Uterine cancer Maternal Grandmother   . Colon cancer Other         Paternal Event organiser  . Breast cancer Other        Maternal Great Grandmother  . Hypertension Father   . Heart attack Father   . Hypertension Brother   . Lupus Mother   . Osteoporosis Mother     Social History   Socioeconomic History  . Marital status: Single    Spouse name: Not on file  . Number of children: Not on file  . Years of education: Not on file  . Highest education level: Not on file  Occupational History  . Not on file  Tobacco Use  . Smoking status: Never Smoker  . Smokeless tobacco: Never Used  Substance and Sexual Activity  . Alcohol use: Not Currently    Comment: ocassional  . Drug use: No  . Sexual activity: Never    Partners: Male  Other Topics Concern  . Not on file  Social History Narrative  . Not on file   Social Determinants of Health   Financial Resource Strain:   . Difficulty of Paying Living Expenses:   Food Insecurity:   . Worried About Charity fundraiser in the Last Year:   . Arboriculturist in the Last Year:   Transportation Needs:   . Film/video editor (Medical):   Marland Kitchen Lack of Transportation (Non-Medical):  Physical Activity:   . Days of Exercise per Week:   . Minutes of Exercise per Session:   Stress:   . Feeling of Stress :   Social Connections:   . Frequency of Communication with Friends and Family:   . Frequency of Social Gatherings with Friends and Family:   . Attends Religious Services:   . Active Member of Clubs or Organizations:   . Attends Archivist Meetings:   Marland Kitchen Marital Status:   Intimate Partner Violence:   . Fear of Current or Ex-Partner:   . Emotionally Abused:   Marland Kitchen Physically Abused:   . Sexually Abused:      Constitutional: Denies fever, malaise, fatigue, headache or abrupt weight changes.  HEENT: Denies eye pain, eye redness, ear pain, ringing in the ears, wax buildup, runny nose, nasal congestion, bloody nose, or sore throat. Respiratory: Denies difficulty breathing, shortness  of breath, cough or sputum production.   Cardiovascular: Denies chest pain, chest tightness, palpitations or swelling in the hands or feet.  Gastrointestinal: Denies abdominal pain, bloating, constipation, diarrhea or blood in the stool.  GU: Denies urgency, frequency, pain with urination, burning sensation, blood in urine, odor or discharge. Musculoskeletal: Pt reports joint pain in hands. Denies decrease in range of motion, difficulty with gait, muscle pain or joint swelling.  Skin: Pt reports thickend curved toenail, 2nd toe, right foot. Pt reports skin lesion right temple, right lower leg. Denies redness, rashes, or ulcercations.  Neurological: Pt has insomnia. Denies dizziness, difficulty with memory, difficulty with speech or problems with balance and coordination.  Psych: Pt has anxiety. Denies depression, SI/HI.  No other specific complaints in a complete review of systems (except as listed in HPI above).     Objective:   Physical Exam   BP 126/78   Pulse 83   Temp (!) 97 F (36.1 C) (Temporal)   Ht 5' 2.33" (1.583 m)   Wt 152 lb (68.9 kg)   LMP 10/30/2012   SpO2 98%   BMI 27.51 kg/m   Wt Readings from Last 3 Encounters:  09/03/19 150 lb (68 kg)  07/06/19 160 lb (72.6 kg)  09/22/18 155 lb (70.3 kg)    General: Appears her stated age, well developed, well nourished in NAD. Skin: Warm, dry and intact. Seborrheic keratosis noted of right temple, right proximal, medial calf. Scarring from dog bite noted to right hand. Fungal infection noted of 2nd toenail, right foot. HEENT: Head: normal shape and size; Eyes: sclera white, no icterus, conjunctiva pink, PERRLA and EOMs intact;  Neck:  Neck supple, trachea midline. No masses, lumps present.  Cardiovascular: Normal rate and rhythm. S1,S2 noted.  No murmur, rubs or gallops noted. No JVD or BLE edema. No carotid bruits noted. Pulmonary/Chest: Normal effort and positive vesicular breath sounds. No respiratory distress. No wheezes,  rales or ronchi noted.  Abdomen: Soft and nontender. Normal bowel sounds. No distention or masses noted. Liver, spleen and kidneys non palpable. Musculoskeletal: Heberden's nodes noted in bilateral hands. Strength 5/5 BUE/BLE.  No difficulty with gait.  Neurological: Alert and oriented. Cranial nerves II-XII grossly intact. Coordination normal.  Psychiatric: Mood and affect normal. Behavior is normal. Judgment and thought content normal.     BMET    Component Value Date/Time   NA 140 09/03/2019 1127   K 4.0 09/03/2019 1127   CL 105 09/03/2019 1127   CO2 24 09/03/2019 1127   GLUCOSE 91 09/03/2019 1127   BUN 15 09/03/2019 1127   CREATININE 0.55  09/03/2019 1127   CALCIUM 9.4 09/03/2019 1127   GFRNONAA >60 09/03/2019 1127   GFRAA >60 09/03/2019 1127    Lipid Panel  No results found for: CHOL, TRIG, HDL, CHOLHDL, VLDL, LDLCALC  CBC    Component Value Date/Time   HGB 12.5 09/03/2019 1127   HCT 41.0 01/22/2011 1313    Hgb A1C No results found for: HGBA1C         Assessment & Plan:   Preventative Health Maintenance:  Encouraged her to get a flu shot in the fall Tetanus UTD Covid UTD Pap smear today, she declined screening Mammogram and bone density ordered, she will call to schedule Colon screening due, she declines referral at this time, will request copy of report from Dr. Lorie Apley. Encouraged her to consume a balanced diet and exercise regimen Advised her to see an eye doctor and dentist annually We will check CBC, C met, TSH, FreeT4, lipid, vitamin D, HIV, hep C today  Seborrheic Keratosis:  Benign Will monitor for now  Fungal Infection, 2nd Toe, Right Foot:  CMET today If normal, will send in Terbinafine 250 mg PO daily x 1 month Will repeat CMET in 1 month, lab only.   RTC in 1 month, lab only [place], NP This visit occurred during the SARS-CoV-2 public health emergency.  Safety protocols were in place, including screening questions prior to  the visit, additional usage of staff PPE, and extensive cleaning of exam room while observing appropriate contact time as indicated for disinfecting solutions.

## 2019-10-19 NOTE — Assessment & Plan Note (Signed)
TSH and Free T4 today Will adjust Levothyroxine if needed based on labs 

## 2019-10-19 NOTE — Patient Instructions (Signed)
Health Maintenance, Female Adopting a healthy lifestyle and getting preventive care are important in promoting health and wellness. Ask your health care provider about:  The right schedule for you to have regular tests and exams.  Things you can do on your own to prevent diseases and keep yourself healthy. What should I know about diet, weight, and exercise? Eat a healthy diet   Eat a diet that includes plenty of vegetables, fruits, low-fat dairy products, and lean protein.  Do not eat a lot of foods that are high in solid fats, added sugars, or sodium. Maintain a healthy weight Body mass index (BMI) is used to identify weight problems. It estimates body fat based on height and weight. Your health care provider can help determine your BMI and help you achieve or maintain a healthy weight. Get regular exercise Get regular exercise. This is one of the most important things you can do for your health. Most adults should:  Exercise for at least 150 minutes each week. The exercise should increase your heart rate and make you sweat (moderate-intensity exercise).  Do strengthening exercises at least twice a week. This is in addition to the moderate-intensity exercise.  Spend less time sitting. Even light physical activity can be beneficial. Watch cholesterol and blood lipids Have your blood tested for lipids and cholesterol at 58 years of age, then have this test every 5 years. Have your cholesterol levels checked more often if:  Your lipid or cholesterol levels are high.  You are older than 58 years of age.  You are at high risk for heart disease. What should I know about cancer screening? Depending on your health history and family history, you may need to have cancer screening at various ages. This may include screening for:  Breast cancer.  Cervical cancer.  Colorectal cancer.  Skin cancer.  Lung cancer. What should I know about heart disease, diabetes, and high blood  pressure? Blood pressure and heart disease  High blood pressure causes heart disease and increases the risk of stroke. This is more likely to develop in people who have high blood pressure readings, are of African descent, or are overweight.  Have your blood pressure checked: ? Every 3-5 years if you are 18-39 years of age. ? Every year if you are 40 years old or older. Diabetes Have regular diabetes screenings. This checks your fasting blood sugar level. Have the screening done:  Once every three years after age 40 if you are at a normal weight and have a low risk for diabetes.  More often and at a younger age if you are overweight or have a high risk for diabetes. What should I know about preventing infection? Hepatitis B If you have a higher risk for hepatitis B, you should be screened for this virus. Talk with your health care provider to find out if you are at risk for hepatitis B infection. Hepatitis C Testing is recommended for:  Everyone born from 1945 through 1965.  Anyone with known risk factors for hepatitis C. Sexually transmitted infections (STIs)  Get screened for STIs, including gonorrhea and chlamydia, if: ? You are sexually active and are younger than 58 years of age. ? You are older than 58 years of age and your health care provider tells you that you are at risk for this type of infection. ? Your sexual activity has changed since you were last screened, and you are at increased risk for chlamydia or gonorrhea. Ask your health care provider if   you are at risk.  Ask your health care provider about whether you are at high risk for HIV. Your health care provider may recommend a prescription medicine to help prevent HIV infection. If you choose to take medicine to prevent HIV, you should first get tested for HIV. You should then be tested every 3 months for as long as you are taking the medicine. Pregnancy  If you are about to stop having your period (premenopausal) and  you may become pregnant, seek counseling before you get pregnant.  Take 400 to 800 micrograms (mcg) of folic acid every day if you become pregnant.  Ask for birth control (contraception) if you want to prevent pregnancy. Osteoporosis and menopause Osteoporosis is a disease in which the bones lose minerals and strength with aging. This can result in bone fractures. If you are 65 years old or older, or if you are at risk for osteoporosis and fractures, ask your health care provider if you should:  Be screened for bone loss.  Take a calcium or vitamin D supplement to lower your risk of fractures.  Be given hormone replacement therapy (HRT) to treat symptoms of menopause. Follow these instructions at home: Lifestyle  Do not use any products that contain nicotine or tobacco, such as cigarettes, e-cigarettes, and chewing tobacco. If you need help quitting, ask your health care provider.  Do not use street drugs.  Do not share needles.  Ask your health care provider for help if you need support or information about quitting drugs. Alcohol use  Do not drink alcohol if: ? Your health care provider tells you not to drink. ? You are pregnant, may be pregnant, or are planning to become pregnant.  If you drink alcohol: ? Limit how much you use to 0-1 drink a day. ? Limit intake if you are breastfeeding.  Be aware of how much alcohol is in your drink. In the [place] one drink equals one 12 oz bottle of beer (355 mL), one 5 oz glass of wine (148 mL), or one 1 oz glass of hard liquor (44 mL). General instructions  Schedule regular health, dental, and eye exams.  Stay current with your vaccines.  Tell your health care provider if: ? You often feel depressed. ? You have ever been abused or do not feel safe at home. Summary  Adopting a healthy lifestyle and getting preventive care are important in promoting health and wellness.  Follow your health care provider's instructions about healthy  diet, exercising, and getting tested or screened for diseases.  Follow your health care provider's instructions on monitoring your cholesterol and blood pressure. This information is not intended to replace advice given to you by your health care provider. Make sure you discuss any questions you have with your health care provider. Document Revised: 05/13/2018 Document Reviewed: 05/13/2018 Elsevier Patient Education  2020 Elsevier Inc.  

## 2019-10-19 NOTE — Telephone Encounter (Signed)
Colonoscopy results requested

## 2019-10-20 LAB — VITAMIN D 25 HYDROXY (VIT D DEFICIENCY, FRACTURES): VITD: 39.09 ng/mL (ref 30.00–100.00)

## 2019-10-20 LAB — T4, FREE: Free T4: 0.81 ng/dL (ref 0.60–1.60)

## 2019-10-20 LAB — TSH: TSH: 1.81 u[IU]/mL (ref 0.35–4.50)

## 2019-10-21 LAB — CYTOLOGY - PAP
Comment: NEGATIVE
Diagnosis: NEGATIVE
High risk HPV: NEGATIVE

## 2019-10-22 ENCOUNTER — Telehealth: Payer: Self-pay

## 2019-10-22 DIAGNOSIS — B351 Tinea unguium: Secondary | ICD-10-CM

## 2019-10-22 LAB — ANA: Anti Nuclear Antibody (ANA): POSITIVE — AB

## 2019-10-22 LAB — ANTI-NUCLEAR AB-TITER (ANA TITER): ANA Titer 1: 1:40 {titer} — ABNORMAL HIGH

## 2019-10-22 LAB — HEPATITIS C ANTIBODY
Hepatitis C Ab: NONREACTIVE
SIGNAL TO CUT-OFF: 0.01 (ref <1.00)

## 2019-10-22 LAB — RHEUMATOID FACTOR: Rheumatoid fact SerPl-aCnc: <14 IU/mL (ref <14)

## 2019-10-22 LAB — HIV ANTIBODY (ROUTINE TESTING W REFLEX): HIV 1&2 Ab, 4th Generation: NONREACTIVE

## 2019-10-22 MED ORDER — TERBINAFINE HCL 250 MG PO TABS: 250.0000 mg | ORAL_TABLET | Freq: Every day | ORAL | 0 refills | Status: DC

## 2019-10-22 MED FILL — TERBINAFINE HCL 250 MG TAB: 250 | 30 days supply | Qty: 30 | Fill #0

## 2019-10-22 MED FILL — LEVOTHYROXINE SODIUM 25 MCG: 25 | 90 days supply | Qty: 90 | Fill #1

## 2019-10-22 NOTE — Telephone Encounter (Signed)
RX sent to pharmacy. Needs to schedule lab only appt in 1 month.

## 2019-10-22 NOTE — Telephone Encounter (Signed)
Pt called wanting to know if the Rx for toenail fungus would be sent to the pharmacy as you said at the [place] you would need to see labs first... pt is aware that you will send Rx to the pharmacy if labs were normal [facility]

## 2019-10-25 ENCOUNTER — Other Ambulatory Visit: Payer: Self-pay | Admitting: Internal Medicine

## 2019-10-25 MED FILL — traZODone HCL 50 MG TABS: 50 | 30 days supply | Qty: 30 | Fill #0

## 2019-11-12 NOTE — Telephone Encounter (Signed)
Lab reminder msg sent to pt via mychart

## 2019-11-17 ENCOUNTER — Other Ambulatory Visit (HOSPITAL_COMMUNITY): Payer: Self-pay | Admitting: Family Medicine

## 2019-11-17 MED FILL — OMEPRAZOLE DR 20 MG CAPSULE: 20 | 30 days supply | Qty: 60 | Fill #0

## 2019-12-20 ENCOUNTER — Other Ambulatory Visit: Payer: Self-pay | Admitting: Internal Medicine

## 2019-12-20 MED FILL — OMEPRAZOLE DR 20 MG CAPSULE: 20 | 30 days supply | Qty: 60 | Fill #1

## 2019-12-21 ENCOUNTER — Other Ambulatory Visit: Payer: Self-pay | Admitting: Internal Medicine

## 2019-12-21 MED FILL — SERTRALINE HCL 50 MG TABLET: 50 | 90 days supply | Qty: 90 | Fill #0

## 2019-12-29 ENCOUNTER — Other Ambulatory Visit: Payer: Self-pay | Admitting: Internal Medicine

## 2019-12-29 MED FILL — traZODone HCL 50 MG TABS: 50 | 30 days supply | Qty: 30 | Fill #1

## 2019-12-30 ENCOUNTER — Other Ambulatory Visit: Payer: Self-pay | Admitting: Internal Medicine

## 2019-12-30 MED FILL — LOSARTAN-HCTZ 50-12.5 MG TA: 50-12.5 | 90 days supply | Qty: 90 | Fill #0

## 2020-01-04 ENCOUNTER — Other Ambulatory Visit: Payer: Self-pay

## 2020-01-04 ENCOUNTER — Ambulatory Visit
Admission: RE | Admit: 2020-01-04 | Discharge: 2020-01-04 | Disposition: A | Discharge status: Home / Self Care | Payer: No Typology Code available for payment source | Source: Ambulatory Visit | Attending: Internal Medicine | Admitting: Internal Medicine

## 2020-01-04 DIAGNOSIS — Z1231 Encounter for screening mammogram for malignant neoplasm of breast: Secondary | ICD-10-CM

## 2020-01-04 DIAGNOSIS — Z780 Asymptomatic menopausal state: Secondary | ICD-10-CM

## 2020-01-07 ENCOUNTER — Other Ambulatory Visit: Payer: Self-pay | Admitting: Internal Medicine

## 2020-01-20 ENCOUNTER — Other Ambulatory Visit: Payer: Self-pay | Admitting: Internal Medicine

## 2020-01-20 MED FILL — OMEPRAZOLE DR 20 MG CAPSULE: 20 | 30 days supply | Qty: 60 | Fill #2

## 2020-01-21 ENCOUNTER — Other Ambulatory Visit: Payer: Self-pay | Admitting: Internal Medicine

## 2020-01-21 MED FILL — LEVOTHYROXINE SODIUM 25 MCG: 25 | 90 days supply | Qty: 90 | Fill #0

## 2020-02-21 MED FILL — OMEPRAZOLE DR 20 MG CAPSULE: 20 | 30 days supply | Qty: 60 | Fill #3

## 2020-03-20 MED FILL — SERTRALINE HCL 50 MG TABLET: 50 | 90 days supply | Qty: 90 | Fill #1

## 2020-03-20 MED FILL — OMEPRAZOLE DR 20 MG CAPSULE: 20 | 30 days supply | Qty: 60 | Fill #4

## 2020-03-29 MED FILL — LOSARTAN-HCTZ 50-12.5 MG TA: 50-12.5 | 90 days supply | Qty: 90 | Fill #1

## 2020-04-18 MED FILL — OMEPRAZOLE DR 20 MG CAPSULE: 20 | 30 days supply | Qty: 60 | Fill #5

## 2020-04-25 ENCOUNTER — Other Ambulatory Visit: Payer: Self-pay | Admitting: Internal Medicine

## 2020-04-25 MED FILL — LEVOTHYROXINE SODIUM 25 MCG: 25 | 90 days supply | Qty: 90 | Fill #1

## 2020-04-26 ENCOUNTER — Other Ambulatory Visit: Payer: Self-pay | Admitting: Internal Medicine

## 2020-04-26 MED FILL — traZODone HCL 50 MG TABS: 50 | 30 days supply | Qty: 30 | Fill #0

## 2020-05-22 MED FILL — OMEPRAZOLE DR 20 MG CAPSULE: 20 | 30 days supply | Qty: 60 | Fill #6

## 2020-06-02 MED FILL — traZODone HCL 50 MG TABS: 50 | 30 days supply | Qty: 30 | Fill #1

## 2020-06-19 ENCOUNTER — Other Ambulatory Visit: Payer: Self-pay | Admitting: Internal Medicine

## 2020-06-19 MED FILL — OMEPRAZOLE DR 20 MG CAPSULE: 20 | 30 days supply | Qty: 60 | Fill #7

## 2020-06-20 ENCOUNTER — Other Ambulatory Visit: Payer: Self-pay | Admitting: Internal Medicine

## 2020-06-20 MED FILL — SERTRALINE HCL 50 MG TABLET: 50 | 90 days supply | Qty: 90 | Fill #0

## 2020-06-28 ENCOUNTER — Other Ambulatory Visit: Payer: Self-pay | Admitting: Internal Medicine

## 2020-07-03 ENCOUNTER — Other Ambulatory Visit: Payer: Self-pay | Admitting: Internal Medicine

## 2020-07-03 MED FILL — LOSARTAN-HCTZ 50-12.5 MG TA: 50-12.5 | 30 days supply | Qty: 30 | Fill #0

## 2020-07-18 ENCOUNTER — Other Ambulatory Visit: Payer: Self-pay | Admitting: Internal Medicine

## 2020-07-18 MED FILL — OMEPRAZOLE DR 20 MG CAPSULE: 20 | 30 days supply | Qty: 60 | Fill #8

## 2020-07-19 ENCOUNTER — Other Ambulatory Visit: Payer: Self-pay | Admitting: Internal Medicine

## 2020-07-19 MED FILL — LEVOTHYROXINE SODIUM 25 MCG: 25 | 90 days supply | Qty: 90 | Fill #0

## 2020-08-01 MED FILL — LOSARTAN-HCTZ 50-12.5 MG TA: 50-12.5 | 30 days supply | Qty: 30 | Fill #1

## 2020-08-21 MED FILL — OMEPRAZOLE DR 20 MG CAPSULE: 20 | 30 days supply | Qty: 60 | Fill #9

## 2020-08-22 ENCOUNTER — Ambulatory Visit (HOSPITAL_BASED_OUTPATIENT_CLINIC_OR_DEPARTMENT_OTHER): Payer: No Typology Code available for payment source | Admitting: Nurse Practitioner

## 2020-08-24 ENCOUNTER — Encounter (HOSPITAL_BASED_OUTPATIENT_CLINIC_OR_DEPARTMENT_OTHER): Payer: Self-pay | Admitting: Nurse Practitioner

## 2020-08-24 ENCOUNTER — Ambulatory Visit (INDEPENDENT_AMBULATORY_CARE_PROVIDER_SITE_OTHER): Payer: No Typology Code available for payment source | Admitting: Nurse Practitioner

## 2020-08-24 ENCOUNTER — Other Ambulatory Visit: Payer: Self-pay

## 2020-08-24 ENCOUNTER — Other Ambulatory Visit (HOSPITAL_BASED_OUTPATIENT_CLINIC_OR_DEPARTMENT_OTHER): Payer: Self-pay | Admitting: Nurse Practitioner

## 2020-08-24 VITALS — BP 111/70 | HR 59 | Ht 62.0 in | Wt 137.2 lb

## 2020-08-24 DIAGNOSIS — R1013 Epigastric pain: Secondary | ICD-10-CM | POA: Insufficient documentation

## 2020-08-24 DIAGNOSIS — M85851 Other specified disorders of bone density and structure, right thigh: Secondary | ICD-10-CM

## 2020-08-24 DIAGNOSIS — Z800 Family history of malignant neoplasm of digestive organs: Secondary | ICD-10-CM | POA: Insufficient documentation

## 2020-08-24 DIAGNOSIS — R634 Abnormal weight loss: Secondary | ICD-10-CM

## 2020-08-24 DIAGNOSIS — L819 Disorder of pigmentation, unspecified: Secondary | ICD-10-CM

## 2020-08-24 DIAGNOSIS — L821 Other seborrheic keratosis: Secondary | ICD-10-CM | POA: Insufficient documentation

## 2020-08-24 DIAGNOSIS — R194 Change in bowel habit: Secondary | ICD-10-CM | POA: Insufficient documentation

## 2020-08-24 DIAGNOSIS — E039 Hypothyroidism, unspecified: Secondary | ICD-10-CM | POA: Diagnosis not present

## 2020-08-24 DIAGNOSIS — R140 Abdominal distension (gaseous): Secondary | ICD-10-CM | POA: Insufficient documentation

## 2020-08-24 DIAGNOSIS — F331 Major depressive disorder, recurrent, moderate: Secondary | ICD-10-CM | POA: Insufficient documentation

## 2020-08-24 DIAGNOSIS — K625 Hemorrhage of anus and rectum: Secondary | ICD-10-CM | POA: Insufficient documentation

## 2020-08-24 DIAGNOSIS — Z7689 Persons encountering health services in other specified circumstances: Secondary | ICD-10-CM | POA: Diagnosis not present

## 2020-08-24 DIAGNOSIS — F332 Major depressive disorder, recurrent severe without psychotic features: Secondary | ICD-10-CM

## 2020-08-24 DIAGNOSIS — Z1231 Encounter for screening mammogram for malignant neoplasm of breast: Secondary | ICD-10-CM

## 2020-08-24 DIAGNOSIS — I10 Essential (primary) hypertension: Secondary | ICD-10-CM | POA: Diagnosis not present

## 2020-08-24 DIAGNOSIS — M151 Heberden's nodes (with arthropathy): Secondary | ICD-10-CM | POA: Insufficient documentation

## 2020-08-24 DIAGNOSIS — F419 Anxiety disorder, unspecified: Secondary | ICD-10-CM

## 2020-08-24 DIAGNOSIS — Z0000 Encounter for general adult medical examination without abnormal findings: Secondary | ICD-10-CM

## 2020-08-24 HISTORY — DX: Abdominal distension (gaseous): R14.0

## 2020-08-24 HISTORY — DX: Encounter for general adult medical examination without abnormal findings: Z00.00

## 2020-08-24 HISTORY — DX: Change in bowel habit: R19.4

## 2020-08-24 HISTORY — DX: Hemorrhage of anus and rectum: K62.5

## 2020-08-24 HISTORY — DX: Disorder of pigmentation, unspecified: L81.9

## 2020-08-24 HISTORY — DX: Epigastric pain: R10.13

## 2020-08-24 HISTORY — DX: Abnormal weight loss: R63.4

## 2020-08-24 HISTORY — DX: Encounter for screening mammogram for malignant neoplasm of breast: Z12.31

## 2020-08-24 HISTORY — DX: Major depressive disorder, recurrent, moderate: F33.1

## 2020-08-24 MED ORDER — SERTRALINE HCL 50 MG PO TABS: 75.0000 mg | ORAL_TABLET | Freq: Every day | ORAL | 1 refills | Status: DC

## 2020-08-24 MED FILL — SERTRALINE HCL 50 MG TABLET: 50 | 30 days supply | Qty: 45 | Fill #0

## 2020-08-24 NOTE — Assessment & Plan Note (Signed)
Ordered entered for routine mammogram screening in August of this year.

## 2020-08-24 NOTE — Assessment & Plan Note (Signed)
Significant exacerbation of MDD and anxiety with PHQ score of 24 and GAD score of 21. Exacerbation is most likely situational related to the recent tragic losses of her pets, who are like children for her. She is clearly grieving these losses.  No signs of suicidal ideation or self harm present.  Discussed with patient the natural process of grieving and recommend to trial an increase in her sertraline for better control of symptoms. Given her significant symptoms, I did tell her that we may need to taper her current dose up every 2 weeks to reach optimal effect.  Sertraline change from 50mg  to 75mg  per day. Recheck in 2 weeks with plan to increase dosage to 100mg  at that time as long as patient tolerates dose increase.  Patient encouraged to follow-up sooner if her symptoms worsen or if she experiences any side effects from medication change.  Continue trazodone at bedtime.

## 2020-08-24 NOTE — Addendum Note (Signed)
Addended by: Marice Guidone, Clarise Cruz E on: 08/24/2020 12:55 PM   Modules accepted: Orders

## 2020-08-24 NOTE — Assessment & Plan Note (Signed)
Blood pressure well controlled in the office today.  Will plan to continue current medication regimen and check labs in 2 weeks at CPE.

## 2020-08-24 NOTE — Assessment & Plan Note (Signed)
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 Will review labs from previous provider and follow-up with plan in 2 weeks at CPE

## 2020-08-24 NOTE — Assessment & Plan Note (Signed)
New patient to practice.  Will plan for CPE in 2 weeks with labs for chronic diseases.

## 2020-08-24 NOTE — Assessment & Plan Note (Signed)
1.5cm lesion to right temporal region with appearance of seborrheic keratosis. Will perform cryotherapy for lesion removal at appointment in 2 weeks. If cryotherapy is not effective, consider surgical removal.  1cm lesion to right anteromedial portion of proximal lower extremity near the popliteal fossa. 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 Plan for treatment in 2 weeks for removal.

## 2020-08-24 NOTE — Assessment & Plan Note (Signed)
Repeat DEXA due in 01/2023 due to osteopenia. Will check calcium levels at CPE in 2 weeks.

## 2020-08-24 NOTE — Patient Instructions (Addendum)
Recommendations from today's visit: We will schedule your annual physical with labs about 2 weeks. You can schedule this at check-out. Plan to come fasting for your labs and we will have those drawn right before the visit. We will plan to follow-up on the medication change and we can remove the two moles at that time.   Your next mammogram is due in August. I have placed an order for that and they will call to schedule you for this exam.   Your next DEXA scan is due in August of 2024. We will schedule this in the future or sooner if problems arise.   We will increase your sertraline (Zoloft) to 77m per day. We will likely need to increase this dose again, as the good therapeutic level is a minimum of 1060m but we can see how you are feeling on this at your next visit and make a decision then.   I have ordered a referral for the counselor that works with our office.  _________________________________________________________________________  [facility] you [facility]. I am excited for the opportunity to partner with you to meet your health care goals. It was a pleasure meeting you today!  I am an Adult-Geriatric Nurse Practitioner with a background in caring for patients for more than 20 years. I received my BaPaediatric nursen Nursing and my Doctor of Nursing Practice degrees at [employer] received additional fellowship training in primary care and sports medicine after receiving my doctorate degree. I provide primary care and sports medicine services to patients age 6672nd older within this office. I am also a provider with the [facility] the director of the APP Fellowship with [facility] [place] years and am proud to be a member of this community.   I am passionate about providing the best service to  you through preventive medicine and supportive care. I consider you a part of the medical team and value your input. I work diligently to ensure that you are heard and your needs are met in a safe and effective manner. I want you to feel comfortable with me as your provider and want you to know that your health concerns are important to me.   For your information, our office hours are Monday- Friday 8:00 AM - 5:00 PM At this time I am not in the office on Wednesdays.  If you have questions or concerns, please call our office at [phone] [place] MyChart message and we will respond as quickly as possible.   For all urgent or time sensitive needs we ask that you please call the office to avoid delays. MyChart is not constantly monitored and replies may take up to 72 business hours.  MyChart Policy: . MyChart allows for you to see your visit notes, after visit summary, provider recommendations, lab and tests results, make an appointment, request refills, and contact your provider or the office for non-urgent questions or concerns.  . Providers are seeing patients during normal business hours and do not have built in time to review MyChart messages. We ask that you allow a minimum of 72 business hours for MyChart message responses.  . Complex MyChart concerns may require a visit. Your provider may request you schedule a virtual or in person visit to ensure we are providing the  best care possible. . MyChart messages sent after 4:00 PM on Friday will not be received by the provider until Monday morning.    Lab and Test Results: . You will receive your lab and test results on MyChart as soon as they are completed and results have been sent by the lab or testing facility. Due to this service, you will receive your results BEFORE your provider.  . Please allow a minimum of 72 business hours for your provider to receive and review lab and test results and contact you about.   . Most lab and test  result comments from the provider will be sent through [facility]. Your provider may recommend changes to the plan of care, follow-up visits, repeat testing, ask questions, or request an office visit to discuss these results. You may reply directly to this message or call the office at [phone] to provide information for the provider or set up an appointment. . In some instances, you will be called with test results and recommendations. Please let us know if this is preferred and we will make note of this in your chart to provide this for you.    . If you have not heard a response to your lab or test results in 72 business hours, please call the office to let us know.   After Hours: . For all non-emergency after hours needs, please call the office at [phone] and select the option to reach the on-call provider service. On-call services are shared between multiple [place] offices and therefore it will not be possible to speak directly with your provider. On-call providers may provide medical advice and recommendations, but are unable to provide refills for maintenance medications.  . For all emergency or urgent medical needs after normal business hours, we recommend that you seek care at the closest Urgent Care or Emergency Department to ensure appropriate treatment in a timely manner.  [facility] [place] [facility] has a 24 hour emergency room located on the ground floor for your convenience.    Please do not hesitate to reach out to [place] with concerns.   Thank you, again, for choosing me as your health care partner. I appreciate your trust and look forward to learning more about you.   Worthy Keeler, DNP, AGNP-c ____________________________________________________________________________  Health Maintenance Recommendations Screening Testing  Mammogram  Every 1 -2 years based on history and risk factors  Starting at age 32  Pap Smear  Ages 21-39 every 3 years  Ages 48-65  every 5 years with HPV testing  More frequent testing may be required based on results and history  Colon Cancer Screening  Every 1-10 years based on test performed, risk factors, and history  Starting at age 102  Bone Density Screening  Every 2-10 years based on history  Starting at age 67 for women  Recommendations for men differ based on medication usage, history, and risk factors  AAA Screening  One time ultrasound  Men 19-60 years old who have every smoked  Lung Cancer Screening  Low Dose Lung CT every 12 months  Age 14-80 years with a 30 pack-year smoking history who still smoke or who have quit within the last 15 years  Screening Labs  Routine  Labs: Complete Blood Count (CBC), Complete Metabolic Panel (CMP), Cholesterol (Lipid Panel)  Every 6-12 months based on history and medications  May be recommended more frequently based on current conditions or previous results  Hemoglobin A1c Lab  Every 3-12 months based on  history and previous results  Starting at age 10 or earlier with diagnosis of diabetes, high cholesterol, BMI >26, and/or risk factors  Frequent monitoring for patients with diabetes to ensure blood sugar control  Thyroid Panel (TSH w/ T3 & T4)  Every 6 months based on history, symptoms, and risk factors  May be repeated more often if on medication  HIV  One time testing for all patients 45 and older  May be repeated more frequently for patients with increased risk factors or exposure  Hepatitis C  One time testing for all patients 31 and older  May be repeated more frequently for patients with increased risk factors or exposure  Gonorrhea, Chlamydia  Every 12 months for all sexually active persons 13-24 years  Additional monitoring may be recommended for those who are considered high risk or who have symptoms  PSA  Men 52-47 years old with risk factors  Additional screening may be recommended from age 56-69 based on risk  factors, symptoms, and history  Vaccine Recommendations  Tetanus Booster  All adults every 10 years  Flu Vaccine  All patients 6 months and older every year  COVID Vaccine  All patients 12 years and older  Initial dosing with booster  May recommend additional booster based on age and health history  HPV Vaccine  2 doses all patients age 52-26  Dosing may be considered for patients over 26  Shingles Vaccine (Shingrix)  2 doses all adults 82 years and older  Pneumonia (Pneumovax 23)  All adults 92 years and older  May recommend earlier dosing based on health history  Pneumonia (Prevnar 81)  All adults 11 years and older  Dosed 1 year after Pneumovax 23  Additional Screening, Testing, and Vaccinations may be recommended on an individualized basis based on family history, health history, risk factors, and/or exposure.

## 2020-08-24 NOTE — Assessment & Plan Note (Signed)
Continue levothyroxine at current dose. Will recheck TSH and T4 in 2 weeks at CPE.

## 2020-08-24 NOTE — Progress Notes (Signed)
New Patient Office Visit  Subjective:  Patient ID: Sue Sue [name], Sue [name]    DOB: 02-13-1961  Age: 60 y.o. MRN: [id number]  CC:  Chief Complaint  Patient presents with  . Establish Care    HPI Sue Sue [name] is a pleasant 60 year old Sue [name] who presents today to establish care with this practice. She has previously been seen by [facility] and her last CPE was in March of 2021.  Bilateral DIP Pain and Swelling She presents today with concerns about pain and swelling in DIP joints of her hands with left>right. She thinks she has had a rheumatoid workup in the past, but is not sure what all was completed. Her mother had lupus and she is concerned this may be autoimmune vs. osteoarthritis. She reports that the 5th finger on the left hand has started to unnaturally bend at the DIP and she has noticed changes to the DIP of the second and third finger with nodules present on the joint and swelling, but notices no bending deformity at this time. She states that the pain increases as the day progresses and she finds it difficult to grip items. She has not been taking any pain medication or treatments for this. This is ongoing, but progressing with time.   Depression She endorses sadness with depression and anxiety symptoms that have exacerbated since the tragic loss of two of her pets on Christmas Day of 2021. 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 On Christmas Day, her larger dog became aggressive with her smaller dog while outside and the larger dog attacked and subsequently severely injured the small dog by breaking his sternum and tearing one of his heart valves. After taking the dog to the emergency vet, it was determined that surgery was not a feasible option and she had to have the small dog laid to rest. She tells me that the next  month she had the larger dog laid to rest due to that and several other incidents of aggression that made him not a candidate for re-homing. She tells me that she feels guilty for the aggression that the older dog showed against the small dog and feels as though she should have done something to prevent this from happening. She also tells me that she feels that her middle sister blames her for the death of the small dog, feeling that she should have removed the large dog from the home sooner. She reports that she has been incredibly distraught over this and it has affected her life and health. Subsequently, she took in another dog who has been found to have significant chronic illnesses including uncontrolled diabetes and a hip problem that required surgery. This coupled with the recent losses has almost become too much for her to bear. She is unable to eat as usual and she feels anxious, restless, and very easily angered. She has been on Zoloft 50mg  for quite a while, but she does not feel it is helping at this time. She is taking trazodone for sleep, which is effective for her. She is interested in counseling services.   Moles She endorses two large moles that she would like to have removed. She endorses one brown mole on the right temporal area that has been present for quite some time, but she reports it  is growing in size. She also endorses a large mole on the right posterolateral lower extremity proximal to the popliteal fossa. She denies oozing or bleeding from the mole or color changes. She denies history of known skin cancer.   Her past medical history is as follows: Past Medical History:  Diagnosis Date  . Anxiety   . Arthritis    Hands  . GERD (gastroesophageal reflux disease)   . Hypertension   . Hypothyroid      Past Surgical History:  Procedure Laterality Date  . INCISION AND DRAINAGE Right 09/03/2019   Procedure: INCISION AND DRAINAGE DOG BITE WOUND RIGHT HAND;  Surgeon: Daryll Brod, MD;  Location: [place];  Service: Orthopedics;  Laterality: Right;  IV REGIONAL FOREARM BLOCK  . TONSILLECTOMY  3rd grade  . UPPER ESOPHAGEAL ENDOSCOPIC ULTRASOUND (EUS) N/A 02/25/2017   Procedure: UPPER ESOPHAGEAL ENDOSCOPIC ULTRASOUND (EUS);  Surgeon: Carol Ada, MD;  Location: [facility];  Service: Endoscopy;  Laterality: N/A;    Family History  Problem Relation Age of Onset  . Uterine cancer Maternal Grandmother   . Colon cancer Other        Paternal Event organiser  . Breast cancer Other        Maternal Great Grandmother  . Hypertension Father   . Heart attack Father   . Hypertension Brother   . Lupus Mother   . Osteoporosis Mother     Social History   Socioeconomic History  . Marital status: Single    Spouse name: Not on file  . Number of children: Not on file  . Years of education: Not on file  . Highest education level: Not on file  Occupational History  . Not on file  Tobacco Use  . Smoking status: Never Smoker  . Smokeless tobacco: Never Used  Vaping Use  . Vaping Use: Never used  Substance and Sexual Activity  . Alcohol use: Not Currently    Comment: ocassional  . Drug use: No  . Sexual activity: Never    Partners: Male  Other Topics Concern  . Not on file  Social History Narrative  . Not on file   Social Determinants of Health   Financial Resource Strain: Not on file  Food Insecurity: Not on file  Transportation Needs: Not on file  Physical Activity: Not on file  Stress: Not on file  Social Connections: Not on file  Intimate Partner Violence: Not on file    ROS Review of Systems  Constitutional: Positive for appetite change and fatigue. Negative for unexpected weight change.  Musculoskeletal: Positive for arthralgias and joint swelling.  Skin: Negative for color change.  Psychiatric/Behavioral: Positive for agitation, decreased concentration and dysphoric mood. The patient is nervous/anxious.     Objective:   Today's Vitals: BP  111/70   Pulse (!) 59   Ht 5\' 2"  (1.575 m)   Wt 137 lb 3.2 oz (62.2 kg)   LMP 10/30/2012   SpO2 100%   BMI 25.09 kg/m   Physical Exam Constitutional:      Appearance: Normal appearance.  HENT:     Head: Normocephalic.  Eyes:     Extraocular Movements: Extraocular movements intact.     Conjunctiva/sclera: Conjunctivae normal.     Pupils: Pupils are equal, round, and reactive to light.  Pulmonary:     Effort: Pulmonary effort is normal.  Musculoskeletal:     Right hand: Swelling and bony tenderness present. No deformity. Decreased range of motion. Normal sensation. There is no  disruption of two-point discrimination. Normal capillary refill. Normal pulse.     Left hand: Swelling, deformity and bony tenderness present. Decreased range of motion. Normal sensation. There is no disruption of two-point discrimination. Normal capillary refill. Normal pulse.     Cervical back: Normal range of motion.     Comments: Heberden nodes present on the DIP of the second and 5th finger of the left hand with noted swelling at this DIP joints of all fingers.  Swan neck deformity present to the 5th finger of the left hand at the DIP present.  Strength is decreased in fingers as well as ROM.   Skin:    General: Skin is warm and dry.     Capillary Refill: Capillary refill takes less than 2 seconds.     Coloration: Skin is pale.     Findings: Lesion present.       Neurological:     General: No focal deficit present.     Mental Status: She is alert and oriented to person, place, and time.  Psychiatric:        Behavior: Behavior normal. Behavior is cooperative.        Thought Content: Thought content normal.        Judgment: Judgment normal.     Assessment & Plan:   Problem List Items Addressed This Visit      Cardiovascular and Mediastinum   Hypertension    Blood pressure well controlled in the office today.  Will plan to continue current medication regimen and check labs in 2 weeks at CPE.         Relevant Orders   CBC with Differential/Platelet   Comprehensive metabolic panel   LDL cholesterol, direct   TSH     Endocrine   Hypothyroid    Continue levothyroxine at current dose. Will recheck TSH and T4 in 2 weeks at CPE.       Relevant Orders   TSH     Musculoskeletal and Integument   Pigmented skin lesions    1.5cm lesion to right temporal region with appearance of seborrheic keratosis. Will perform cryotherapy for lesion removal at appointment in 2 weeks. If cryotherapy is not effective, consider surgical removal.  1cm lesion to right anteromedial portion of proximal lower extremity near the popliteal fossa. 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 Plan for treatment in 2 weeks for removal.      Osteopenia of neck of right femur    Repeat DEXA due in 01/2023 due to osteopenia. Will check calcium levels at CPE in 2 weeks.       Relevant Orders   Comprehensive metabolic panel   TSH   Heberden node    Heberden nodes present to the left hand DIP of the 2nd, 3rd, and 5th fingers consistent with degenerative arthritic condition. Will review labs from previous provider and follow-up with plan in 2 weeks at CPE        Other   Anxiety    Significant exacerbation of MDD and anxiety with PHQ score of 24 and GAD score of 21. Exacerbation is most likely situational related to the recent tragic losses of her pets, who are like children for her. She is clearly grieving these losses.  No signs of suicidal ideation or self harm present.  Discussed with patient the natural process of grieving and recommend to trial an increase in her sertraline for better control of symptoms. Given her significant symptoms, I  did tell her that we may need to taper her current dose up every 2 weeks to reach optimal effect.  Sertraline change from 50mg  to 75mg  per day. Recheck in 2 weeks with plan to increase dosage to 100mg  at  that time as long as patient tolerates dose increase.  Patient encouraged to follow-up sooner if her symptoms worsen or if she experiences any side effects from medication change.  Continue trazodone at bedtime.        Relevant Medications   sertraline (ZOLOFT) 50 MG tablet   Encounter for screening mammogram for malignant neoplasm of breast    Ordered entered for routine mammogram screening in August of this year.       Relevant Orders   MM 3D SCREEN BREAST BILATERAL   MDD (major depressive disorder), recurrent episode, moderate (HCC)    Significant exacerbation of MDD and anxiety with PHQ score of 24 and GAD score of 21. Exacerbation is most likely situational related to the recent tragic losses of her pets, who are like children for her. She is clearly grieving these losses.  No signs of suicidal ideation or self harm present.  Discussed with patient the natural process of grieving and recommend to trial an increase in her sertraline for better control of symptoms. Given her significant symptoms, I did tell her that we may need to taper her current dose up every 2 weeks to reach optimal effect.  Sertraline change from 50mg  to 75mg  per day. Recheck in 2 weeks with plan to increase dosage to 100mg  at that time as long as patient tolerates dose increase.  Patient encouraged to follow-up sooner if her symptoms worsen or if she experiences any side effects from medication change.  Continue trazodone at bedtime.       Relevant Medications   sertraline (ZOLOFT) 50 MG tablet   Encounter to establish care - Primary    New patient to practice.  Will plan for CPE in 2 weeks with labs for chronic diseases.           Outpatient Encounter Medications as of 08/24/2020  Medication Sig  . levothyroxine (SYNTHROID) 25 MCG tablet TAKE 1 TABLET (25 MCG TOTAL) BY MOUTH DAILY BEFORE BREAKFAST.  Marland Kitchen losartan-hydrochlorothiazide (HYZAAR) 50-12.5 MG tablet TAKE 1 TABLET BY MOUTH DAILY.  . Multiple  Vitamin (MULTIVITAMIN WITH MINERALS) TABS tablet Take 1 tablet by mouth daily.  Marland Kitchen omeprazole (PRILOSEC) 20 MG capsule Take 20 mg by mouth 2 (two) times daily.  . sertraline (ZOLOFT) 50 MG tablet Take 1.5 tablets (75 mg total) by mouth daily.  Marland Kitchen terbinafine (LAMISIL) 250 MG tablet Take 1 tablet (250 mg total) by mouth daily.  . traZODone (DESYREL) 50 MG tablet TAKE 1/2 TO 1 TABLET BY MOUTH AT BEDTIME AS NEEDED FOR SLEEP  . [DISCONTINUED] ibuprofen (ADVIL) 200 MG tablet Take 200 mg by mouth as needed.  . [DISCONTINUED] sertraline (ZOLOFT) 50 MG tablet TAKE 1 TABLET (50 MG TOTAL) BY MOUTH DAILY.   No facility-administered encounter medications on file as of 08/24/2020.    Follow-up: Return in 2 weeks (on 09/07/2020) for CPE with prior labs in 2 weeks. Please schedule both.   Orma Render, NP

## 2020-08-30 MED FILL — LOSARTAN-HCTZ 50-12.5 MG TA: 50-12.5 | 30 days supply | Qty: 30 | Fill #2

## 2020-09-04 ENCOUNTER — Other Ambulatory Visit: Payer: Self-pay

## 2020-09-04 ENCOUNTER — Other Ambulatory Visit (HOSPITAL_BASED_OUTPATIENT_CLINIC_OR_DEPARTMENT_OTHER)
Admission: RE | Admit: 2020-09-04 | Discharge: 2020-09-04 | Disposition: A | Discharge status: Home / Self Care | Payer: No Typology Code available for payment source | Source: Ambulatory Visit | Attending: Nurse Practitioner | Admitting: Nurse Practitioner

## 2020-09-04 DIAGNOSIS — E039 Hypothyroidism, unspecified: Secondary | ICD-10-CM | POA: Diagnosis present

## 2020-09-04 DIAGNOSIS — M85851 Other specified disorders of bone density and structure, right thigh: Secondary | ICD-10-CM | POA: Insufficient documentation

## 2020-09-04 DIAGNOSIS — I10 Essential (primary) hypertension: Secondary | ICD-10-CM | POA: Insufficient documentation

## 2020-09-04 LAB — COMPREHENSIVE METABOLIC PANEL
ALT: 10 U/L (ref 0–44)
AST: 17 U/L (ref 15–41)
Albumin: 4.5 g/dL (ref 3.5–5.0)
Alkaline Phosphatase: 58 U/L (ref 38–126)
Anion gap: 12 (ref 5–15)
BUN: 10 mg/dL (ref 6–20)
CO2: 25 mmol/L (ref 22–32)
Calcium: 9.9 mg/dL (ref 8.9–10.3)
Chloride: 100 mmol/L (ref 98–111)
Creatinine, Ser: 0.61 mg/dL (ref 0.44–1.00)
GFR, Estimated: >60 mL/min (ref >60)
Glucose, Bld: 110 mg/dL — ABNORMAL HIGH (ref 70–99)
Potassium: 3.3 mmol/L — ABNORMAL LOW (ref 3.5–5.1)
Sodium: 137 mmol/L (ref 135–145)
Total Bilirubin: 0.4 mg/dL (ref 0.3–1.2)
Total Protein: 7.1 g/dL (ref 6.5–8.1)

## 2020-09-04 LAB — CBC WITH DIFFERENTIAL/PLATELET
Abs Immature Granulocytes: 0.04 10*3/uL (ref 0.00–0.07)
Basophils Absolute: 0.1 10*3/uL (ref 0.0–0.1)
Basophils Relative: 1 %
Eosinophils Absolute: 0.5 10*3/uL (ref 0.0–0.5)
Eosinophils Relative: 4 %
HCT: 39.1 % (ref 36.0–46.0)
Hemoglobin: 12.9 g/dL (ref 12.0–15.0)
Immature Granulocytes: 0 %
Lymphocytes Relative: 40 %
Lymphs Abs: 4.7 10*3/uL — ABNORMAL HIGH (ref 0.7–4.0)
MCH: 29.1 pg (ref 26.0–34.0)
MCHC: 33 g/dL (ref 30.0–36.0)
MCV: 88.1 fL (ref 80.0–100.0)
Monocytes Absolute: 0.6 10*3/uL (ref 0.1–1.0)
Monocytes Relative: 5 %
Neutro Abs: 5.8 10*3/uL (ref 1.7–7.7)
Neutrophils Relative %: 50 %
Platelets: 331 10*3/uL (ref 150–400)
RBC: 4.44 MIL/uL (ref 3.87–5.11)
RDW: 12.7 % (ref 11.5–15.5)
WBC: 11.7 10*3/uL — ABNORMAL HIGH (ref 4.0–10.5)
nRBC: 0 % (ref 0.0–0.2)

## 2020-09-04 LAB — TSH: TSH: 2.514 u[IU]/mL (ref 0.350–4.500)

## 2020-09-04 LAB — LDL CHOLESTEROL, DIRECT: Direct LDL: 130.5 mg/dL — ABNORMAL HIGH (ref 0–99)

## 2020-09-04 NOTE — Progress Notes (Signed)
Please call and place reflex order for Hemoglobin A1c- OK to send order.   Glucose a little elevated and potassium is a little low. Also slight bump in WBC. Will discuss with patient at CPE on Friday.

## 2020-09-05 ENCOUNTER — Telehealth (HOSPITAL_BASED_OUTPATIENT_CLINIC_OR_DEPARTMENT_OTHER): Payer: Self-pay

## 2020-09-05 ENCOUNTER — Other Ambulatory Visit (HOSPITAL_BASED_OUTPATIENT_CLINIC_OR_DEPARTMENT_OTHER): Payer: Self-pay

## 2020-09-05 DIAGNOSIS — R7301 Impaired fasting glucose: Secondary | ICD-10-CM

## 2020-09-05 LAB — HEMOGLOBIN A1C
Hgb A1c MFr Bld: 5.7 % — ABNORMAL HIGH (ref 4.8–5.6)
Mean Plasma Glucose: 116.89 mg/dL

## 2020-09-05 NOTE — Telephone Encounter (Signed)
-----   Message from Orma Render, NP sent at 09/04/2020  5:30 PM EDT ----- Please call and place reflex order for Hemoglobin A1c- OK to send order.   Glucose a little elevated and potassium is a little low. Also slight bump in WBC. Will discuss with patient at CPE on Friday.

## 2020-09-05 NOTE — Telephone Encounter (Signed)
Spoke with patient to inform about her glucose being a little elevated and potassium is a little low.  She has a follow up appointment on 09/08/20 at 10:30.

## 2020-09-05 NOTE — Telephone Encounter (Signed)
Order for A1C ordered

## 2020-09-06 NOTE — Progress Notes (Signed)
Will discuss results at upcoming appointment.  A1c 5.7 which indicates pre-diabetes. Will plan to discuss low carbohydrate diet and increased physical activity with patient at upcoming appointment.

## 2020-09-08 ENCOUNTER — Encounter (HOSPITAL_BASED_OUTPATIENT_CLINIC_OR_DEPARTMENT_OTHER): Payer: Self-pay | Admitting: Nurse Practitioner

## 2020-09-08 ENCOUNTER — Ambulatory Visit (INDEPENDENT_AMBULATORY_CARE_PROVIDER_SITE_OTHER): Payer: No Typology Code available for payment source | Admitting: Nurse Practitioner

## 2020-09-08 ENCOUNTER — Other Ambulatory Visit: Payer: Self-pay

## 2020-09-08 VITALS — BP 107/72 | HR 57 | Ht 62.0 in | Wt 137.0 lb

## 2020-09-08 DIAGNOSIS — E876 Hypokalemia: Secondary | ICD-10-CM

## 2020-09-08 DIAGNOSIS — I10 Essential (primary) hypertension: Secondary | ICD-10-CM

## 2020-09-08 DIAGNOSIS — F419 Anxiety disorder, unspecified: Secondary | ICD-10-CM

## 2020-09-08 DIAGNOSIS — E785 Hyperlipidemia, unspecified: Secondary | ICD-10-CM

## 2020-09-08 DIAGNOSIS — L821 Other seborrheic keratosis: Secondary | ICD-10-CM

## 2020-09-08 DIAGNOSIS — R7303 Prediabetes: Secondary | ICD-10-CM | POA: Diagnosis not present

## 2020-09-08 DIAGNOSIS — Z0000 Encounter for general adult medical examination without abnormal findings: Secondary | ICD-10-CM

## 2020-09-08 DIAGNOSIS — M151 Heberden's nodes (with arthropathy): Secondary | ICD-10-CM

## 2020-09-08 DIAGNOSIS — E039 Hypothyroidism, unspecified: Secondary | ICD-10-CM

## 2020-09-08 DIAGNOSIS — F331 Major depressive disorder, recurrent, moderate: Secondary | ICD-10-CM

## 2020-09-08 HISTORY — DX: Hypokalemia: E87.6

## 2020-09-08 NOTE — Assessment & Plan Note (Signed)
Most recent A1c 5.7%. Discussed with patient the diagnosis of pre-diabetes and risks of developing diabetes if blood sugars are not well controlled.  Reported increased sugar intake recently with transition from diet soda to sweet tea. She plans to reduce sweet tea and drinks containing sugar. Overall her diet and exercise are good. Will plan to monitor in 6 months.

## 2020-09-08 NOTE — Progress Notes (Signed)
BP 107/72   Pulse (!) 57   Ht 5\' 2"  (1.575 m)   Wt 137 lb (62.1 kg)   LMP 10/30/2012   SpO2 100%   BMI 25.06 kg/m    Subjective:    Patient ID: Sue [name], female    DOB: 1961/05/04, 59 y.o.   MRN: [id number]  HPI: Sue [name] is a 60 y.o. female presenting on 09/08/2020 for comprehensive medical examination.   Current medical concerns include:mood and seborrheic keratosis.   MOOD She reports that her mood is significantly improved with the increase in sertraline about 2 weeks ago. She endorses less sorrow and some improvement with sleep. She reports that she feels "lighter and happier" and is able to focus on the positives. She has received a call from the therapist office, but has not had time to schedule an appointment yet. She is planning to do this in the near future. She does not feel like she needs an increase in her medication dose yet.   Seborrheic Keratosis She reports two brown lesions with stuck on appearance that are bothersome for her. One is on her right temple at her hairline and he reports that her hairdresser has commented on it several times. The other is on the posterior surface of her right lower leg proximal to the knee. She reports that this one gets caught and it bothersome for her. She does report that they seem to be growing in size, but denies oozing, bleeding, scaling, or color changes. She would like to have them removed today.   HTN She reports her blood pressures have been on the low side of normal when she is checking them at home and would like to see about tapering down her dose of blood pressure medication. She denies headaches, vision changes, palpitations, shortness of breath, lower extremity edema.   Past Medical History:  Past Medical History:  Diagnosis Date  . Anxiety   . Arthritis    Hands  . GERD (gastroesophageal reflux disease)   . Hypertension   . Hypothyroid   . Pigmented skin lesions 08/24/2020  . Rectal bleeding 08/24/2020    Medications:  Current Outpatient Medications on File Prior to Visit  Medication Sig  . levothyroxine (SYNTHROID) 25 MCG tablet TAKE 1 TABLET (25 MCG TOTAL) BY MOUTH DAILY BEFORE BREAKFAST.  Marland Kitchen losartan-hydrochlorothiazide (HYZAAR) 50-12.5 MG tablet TAKE 1 TABLET BY MOUTH ONCE DAILY.  . Multiple Vitamin (MULTIVITAMIN WITH MINERALS) TABS tablet Take 1 tablet by mouth daily.  Marland Kitchen omeprazole (PRILOSEC) 20 MG capsule TAKE 1 CAPSULE BY MOUTH TWO TIMES DAILY  . sertraline (ZOLOFT) 50 MG tablet TAKE 1 & 1/2 TABLET BY MOUTH ONCE DAILY.  . traZODone (DESYREL) 50 MG tablet TAKE 1/2 TO 1 TABLET BY MOUTH AT BEDTIME AS NEEDED FOR SLEEP (Patient taking differently: Take by mouth at bedtime as needed. for sleep)   No current facility-administered medications on file prior to visit.    She currently lives with: Interim Problems from her last visit: no   She reports regular vision exams q1-5y: yes She reports regular dental exams q 14m: yes Her diet consists of: overall heathy- she has had an increase in her appetite recently.  She endorses exercise and/or activity of: daily exercise with walking 1 hour per day.  She works at: [employer] in [employer]  She endorses ETOH use of 1 glass of wine per month She denies nictoine use She denies illegal substance use  She is postmenopausal She reports no current  concerns or symptoms\  She is not currently sexually active with not sexually active She denies concerns today about STI  She endorses concerns about skin changes today: two seborrheic keratoses that she would like to have removed.  She denies concerns about bowel changes today She denies concerns about bladder changes today  Depression Screen done today and results listed below:  Depression screen [facility] 2/9 09/08/2020 08/24/2020 10/19/2019 07/06/2019  Decreased Interest 1 3 0 0  Down, Depressed, Hopeless 2 3 0 0  PHQ - 2 Score 3 6 0 0  Altered sleeping 3 3 - -  Tired, decreased energy 2 3 - -   Change in appetite 2 3 - -  Feeling bad or failure about yourself  3 3 - -  Trouble concentrating 3 3 - -  Moving slowly or fidgety/restless 3 3 - -  Suicidal thoughts 0 0 - -  PHQ-9 Score 19 24 - -  Difficult doing work/chores - Extremely dIfficult - -   Anxiety Screening done and confirms the above findings GAD 7 : Generalized Anxiety Score 09/08/2020 08/24/2020 10/19/2019  Nervous, Anxious, on Edge 2 3 0  Control/stop worrying 3 3 0  Worry too much - different things 3 3 0  Trouble relaxing 3 3 0  Restless 3 3 0  Easily annoyed or irritable 3 3 0  Afraid - awful might happen 3 3 0  Total GAD 7 Score 20 21 0  Anxiety Difficulty Somewhat difficult Extremely difficult Not difficult at all    She has a history of falls. I did complete a risk assessment for falls. A plan of care for falls was not documented. Fall Risk  09/08/2020  Falls in the past year? 1  Number falls in past yr: 0  Injury with Fall? 1  Risk for fall due to : No Fall Risks      Surgical History:  Past Surgical History:  Procedure Laterality Date  . INCISION AND DRAINAGE Right 09/03/2019   Procedure: INCISION AND DRAINAGE DOG BITE WOUND RIGHT HAND;  Surgeon: Daryll Brod, MD;  Location: [facility];  Service: Orthopedics;  Laterality: Right;  IV REGIONAL FOREARM BLOCK  . TONSILLECTOMY  3rd grade  . UPPER ESOPHAGEAL ENDOSCOPIC ULTRASOUND (EUS) N/A 02/25/2017   Procedure: UPPER ESOPHAGEAL ENDOSCOPIC ULTRASOUND (EUS);  Surgeon: Carol Ada, MD;  Location: [facility];  Service: Endoscopy;  Laterality: N/A;    Allergies:  Allergies  Allergen Reactions  . Nsaids     Other reaction(s): GI upset    Social History:  Social History   Socioeconomic History  . Marital status: Single    Spouse name: Not on file  . Number of children: Not on file  . Years of education: Not on file  . Highest education level: Not on file  Occupational History  . Not on file  Tobacco Use  . Smoking status: Never Smoker  . Smokeless  tobacco: Never Used  Vaping Use  . Vaping Use: Never used  Substance and Sexual Activity  . Alcohol use: Not Currently    Comment: ocassional  . Drug use: No  . Sexual activity: Never    Partners: Male  Other Topics Concern  . Not on file  Social History Narrative  . Not on file   Social Determinants of Health   Financial Resource Strain: Not on file  Food Insecurity: Not on file  Transportation Needs: Not on file  Physical Activity: Not on file  Stress: Not on file  Social  Connections: Not on file  Intimate Partner Violence: Not on file   Social History   Tobacco Use  Smoking Status Never Smoker  Smokeless Tobacco Never Used   Social History   Substance and Sexual Activity  Alcohol Use Not Currently   Comment: ocassional    Family History:  Family History  Problem Relation Age of Onset  . Uterine cancer Maternal Grandmother   . Colon cancer Other        Paternal Event organiser  . Breast cancer Other        Maternal Great Grandmother  . Hypertension Father   . Heart attack Father   . Hypertension Brother   . Lupus Mother   . Osteoporosis Mother     Past medical history, surgical history, medications, allergies, family history and social history reviewed with patient today and changes made to appropriate areas of the chart.   All ROS negative except what is listed above and in the HPI.      Objective:    BP 107/72   Pulse (!) 57   Ht 5\' 2"  (1.575 m)   Wt 137 lb (62.1 kg)   LMP 10/30/2012   SpO2 100%   BMI 25.06 kg/m   Wt Readings from Last 3 Encounters:  09/08/20 137 lb (62.1 kg)  08/24/20 137 lb 3.2 oz (62.2 kg)  10/19/19 152 lb (68.9 kg)    Physical Exam Vitals and nursing note reviewed.  Constitutional:      Appearance: Normal appearance. She is normal weight.  HENT:     Head: Normocephalic and atraumatic.     Right Ear: Tympanic membrane, ear canal and external ear normal.     Left Ear: Tympanic membrane, ear canal and external ear  normal.     Nose: Nose normal. No congestion.     Mouth/Throat:     Mouth: Mucous membranes are moist.     Pharynx: Oropharynx is clear. No oropharyngeal exudate or posterior oropharyngeal erythema.  Eyes:     Extraocular Movements: Extraocular movements intact.     Conjunctiva/sclera: Conjunctivae normal.     Pupils: Pupils are equal, round, and reactive to light.  Neck:     Vascular: No carotid bruit.  Cardiovascular:     Rate and Rhythm: Normal rate and regular rhythm.     Pulses: Normal pulses.     Heart sounds: Normal heart sounds. No murmur heard.   Pulmonary:     Effort: Pulmonary effort is normal.     Breath sounds: Normal breath sounds.  Abdominal:     General: Abdomen is flat. Bowel sounds are normal. There is no distension.     Palpations: Abdomen is soft.     Tenderness: There is no abdominal tenderness. There is no right CVA tenderness, left CVA tenderness, guarding or rebound.  Musculoskeletal:        General: Normal range of motion.     Cervical back: Normal range of motion and neck supple. No rigidity or tenderness.     Right lower leg: No edema.     Left lower leg: No edema.  Lymphadenopathy:     Cervical: No cervical adenopathy.  Skin:    General: Skin is warm and dry.     Capillary Refill: Capillary refill takes less than 2 seconds.  Neurological:     General: No focal deficit present.     Mental Status: She is alert and oriented to person, place, and time.  Psychiatric:  Mood and Affect: Mood normal.        Behavior: Behavior normal.        Thought Content: Thought content normal.        Judgment: Judgment normal.     Comments: Mood much improved since last visit.      Results for orders placed or performed in visit on 09/05/20  HgB A1c  Result Value Ref Range   Hgb A1c MFr Bld 5.7 (H) 4.8 - 5.6 %   Mean Plasma Glucose 116.89 mg/dL      Assessment & Plan:   Problem List Items Addressed This Visit      Cardiovascular and Mediastinum    Hypertension    Blood pressure well controlled today. Labs do show slight decrease in potassium. With reported at home blood pressure readings on the low end of normal, we will plan to cut dose in 1/2 to see how she tolerates this change.  New Dose: Losartan-HCTZ 25mg -6.25mg  Patient instructed to monitor blood pressure at home and report if readings reach above 130/80. Given her recent readings, we may very well be able to taper off of the medication if she continues to have good control.  Plan to follow-up in 3-6 months        Endocrine   Hypothyroid    Thyroid levels WNL. Continue levothyroxine 36mcg daily.  Will recheck in 12 months or sooner if needed.         Musculoskeletal and Integument   [place] node    Discussed management of osteoarthritis with Tylenol and topical diclofenac gel for pain management.  No limits to range of motion, although DIP joints are nodular and patient reports daily pain.  If no relief with current treatment, can consider stronger oral medication for arthritis symptoms.       Seborrheic keratosis    Seborrheic keratosis to the right temporal region approximately 1cm in diameter.  Cryotherapy performed with liquid nitrogen for removal due to location.  Patient tolerated procedure well.  Seborrheic keratosis distal to the right popliteal fossa, approximately 1cm in diameter.  Area cleansed with chlorhexidine scrub and allowed to dry.  0.4mL of 1% lidocaine infiltrated in base creating a block. Shave removal performed with complete removal of lesion.  Area dried and triple antibiotic ointment applied. Minimal bleeding present.  Pressure dressing placed and patient provided with care instructions.  Patient tolerated procedure well.         Other   Anxiety    Anxiety much better controlled with increase in sertraline to 75mg . Will plan to continue on this dose and follow-up in approximately 6 months or sooner if needed.  She is planning to schedule  with therapy in the near future- encouraged her to continue with this.  Recommend follow-up if anything changes.       MDD (major depressive disorder), recurrent episode, moderate (HCC)    Improvement of MDD and anxiety scores and patients overall perception of mental health after increase in sertraline dose to 75mg  per day. Patient does not wish to increase dose at this time, but discussed that we can increase to 100mg  if she feels that she needs additional medication.  She has heard from the therapist office, but has not had time to schedule yet, but plans to do this in the near future.  She is continuing trazodone at bedtime with no side effects. She reports sleeping 6 hours per night.  Will continue current plan and make changes as necessary.  Plan to follow-up  in 6 months or sooner if needed.        Encounter for annual physical exam - Primary    Annual physical exam with labs performed today.  Discussion of routine health maintenance and lifestyle recommendations to improve overall health and prevent chronic conditions.  Plan to follow-up in 6 months for check on chronic conditions.       Hyperlipidemia    LDL elevated on recent labs. Unfortunately HDL was not monitored on this test. Historically she has had elevated LDL and elevated HDL with good counterbalance for cholesterol management.  Recommend monitoring saturated fat intake and continue with daily exercise.  Will plan to recheck in 6 months for LDL and HDL levels.       Prediabetes    Most recent A1c 5.7%. Discussed with patient the diagnosis of pre-diabetes and risks of developing diabetes if blood sugars are not well controlled.  Reported increased sugar intake recently with transition from diet soda to sweet tea. She plans to reduce sweet tea and drinks containing sugar. Overall her diet and exercise are good. Will plan to monitor in 6 months.       Hypokalemia    Potassium slightly decreased on recent lab reports.  We are cutting back HCTZ today and recommend increasing foods high in potassium to help prevent excessive imbalance.  She is not experiencing any symptoms or concerns at this time.  She has an intense fear of needles with vasovagal reactions to blood draw, therefore will try to limit monitoring of blood if possible.  Monitor for symptoms of hypokalemia and follow-up if any concerns present. Will plan to recheck labs in 6 months.           Follow up plan: Return in about 6 months (around 03/10/2021) for Mood/HLD.   LABORATORY TESTING:  - Pap smear: up to date - STI testing: deferred  IMMUNIZATIONS:   - Tdap: Tetanus vaccination status reviewed: last tetanus booster within 10 years. - Influenza: Up to date - Pneumovax: Not applicable - Prevnar: Not applicable - HPV: Not applicable - Zostavax vaccine: Not applicable  SCREENING: -Mammogram: Up to date  - Colonoscopy: Up to date  - Bone Density: Up to date  -Hearing Test: Not applicable  -Spirometry: Not applicable   PATIENT COUNSELING:   For all adult patients, I recommend   A well balanced diet low in saturated fats, cholesterol, and moderation in carbohydrates.   This can be as simple as monitoring portion sizes and cutting back on sugary beverages such as soda and  juice to start with.    Daily water consumption of at least 64 ounces.  Physical activity at least 180 minutes per week, if just starting out.   This can be as simple as taking the stairs instead of the elevator and walking 2-3 laps around the office  purposefully every day.   STD protection, partner selection, and regular testing if high risk.  Limited consumption of alcoholic beverages if alcohol is consumed.  For women, I recommend no more than 7 alcoholic beverages per week, spread out throughout the week.  Avoid "binge" drinking or consuming large quantities of alcohol in one setting.   Please let me know if you feel you may need help with reduction or  quitting alcohol consumption.   Avoidance of nicotine, if used.  Please let me know if you feel you may need help with reduction or quitting nicotine use.   Daily mental health attention.  This can be in  the form of 5 minute daily meditation, prayer, journaling, yoga, reflection, etc.   Purposeful attention to your emotions and mental state can significantly improve your overall wellbeing  and  Health.  Please know that I am here to help you with all of your health care goals and am happy to work with you to find a solution that works best for you.  The greatest advice I have received with any changes in life are to take it one step at a time, that even means if all you can focus on is the next 60 seconds, then do that and celebrate your victories.  With any changes in life, you will have set backs, and that is OK. The important thing to remember is, if you have a set back, it is not a failure, it is an opportunity to try again!  Health Maintenance Recommendations Screening Testing  Mammogram  Every 1 -2 years based on history and risk factors  Starting at age 52  Pap Smear  Ages 21-39 every 3 years  Ages 38-65 every 5 years with HPV testing  More frequent testing may be required based on results and history  Colon Cancer Screening  Every 1-10 years based on test performed, risk factors, and history  Starting at age 49  Bone Density Screening  Every 2-10 years based on history  Starting at age 42 for women  Recommendations for men differ based on medication usage, history, and risk factors  AAA Screening  One time ultrasound  Men 39-14 years old who have every smoked  Lung Cancer Screening  Low Dose Lung CT every 12 months  Age 5-80 years with a 30 pack-year smoking history who still smoke or who have quit within the last 15 years  Screening Labs  Routine  Labs: Complete Blood Count (CBC), Complete Metabolic Panel (CMP), Cholesterol (Lipid Panel)  Every  6-12 months based on history and medications  May be recommended more frequently based on current conditions or previous results  Hemoglobin A1c Lab  Every 3-12 months based on history and previous results  Starting at age 45 or earlier with diagnosis of diabetes, high cholesterol, BMI >26, and/or risk factors  Frequent monitoring for patients with diabetes to ensure blood sugar control  Thyroid Panel (TSH w/ T3 & T4)  Every 6 months based on history, symptoms, and risk factors  May be repeated more often if on medication  HIV  One time testing for all patients 59 and older  May be repeated more frequently for patients with increased risk factors or exposure  Hepatitis C  One time testing for all patients 55 and older  May be repeated more frequently for patients with increased risk factors or exposure  Gonorrhea, Chlamydia  Every 12 months for all sexually active persons 13-24 years  Additional monitoring may be recommended for those who are considered high risk or who have symptoms  PSA  Men 56-78 years old with risk factors  Additional screening may be recommended from age 79-69 based on risk factors, symptoms, and history  Vaccine Recommendations  Tetanus Booster  All adults every 10 years  Flu Vaccine  All patients 6 months and older every year  COVID Vaccine  All patients 12 years and older  Initial dosing with booster  May recommend additional booster based on age and health history  HPV Vaccine  2 doses all patients age 32-26  Dosing may be considered for patients over 26  Shingles Vaccine (Shingrix)  2  doses all adults 47 years and older  Pneumonia (Pneumovax 23)  All adults 24 years and older  May recommend earlier dosing based on health history  Pneumonia (Prevnar 13)  All adults 62 years and older  Dosed 1 year after Pneumovax 23  Additional Screening, Testing, and Vaccinations may be recommended on an individualized basis  based on family history, health history, risk factors, and/or exposure.      NEXT PREVENTATIVE PHYSICAL DUE IN 1 YEAR. Return in about 6 months (around 03/10/2021) for Mood/HLD.

## 2020-09-08 NOTE — Assessment & Plan Note (Addendum)
Blood pressure well controlled today. Labs do show slight decrease in potassium. With reported at home blood pressure readings on the low end of normal, we will plan to cut dose in 1/2 to see how she tolerates this change.  New Dose: Losartan-HCTZ 25mg -6.25mg  Patient instructed to monitor blood pressure at home and report if readings reach above 130/80. Given her recent readings, we may very well be able to taper off of the medication if she continues to have good control.  Plan to follow-up in 3-6 months

## 2020-09-08 NOTE — Assessment & Plan Note (Signed)
Improvement of MDD and anxiety scores and patients overall perception of mental health after increase in sertraline dose to 75mg  per day. Patient does not wish to increase dose at this time, but discussed that we can increase to 100mg  if she feels that she needs additional medication.  She has heard from the therapist office, but has not had time to schedule yet, but plans to do this in the near future.  She is continuing trazodone at bedtime with no side effects. She reports sleeping 6 hours per night.  Will continue current plan and make changes as necessary.  Plan to follow-up in 6 months or sooner if needed.

## 2020-09-08 NOTE — Assessment & Plan Note (Signed)
Discussed management of osteoarthritis with Tylenol and topical diclofenac gel for pain management.  No limits to range of motion, although DIP joints are nodular and patient reports daily pain.  If no relief with current treatment, can consider stronger oral medication for arthritis symptoms.

## 2020-09-08 NOTE — Assessment & Plan Note (Signed)
Seborrheic keratosis to the right temporal region approximately 1cm in diameter.  Cryotherapy performed with liquid nitrogen for removal due to location.  Patient tolerated procedure well.  Seborrheic keratosis distal to the right popliteal fossa, approximately 1cm in diameter.  Area cleansed with chlorhexidine scrub and allowed to dry.  0.54mL of 1% lidocaine infiltrated in base creating a block. Shave removal performed with complete removal of lesion.  Area dried and triple antibiotic ointment applied. Minimal bleeding present.  Pressure dressing placed and patient provided with care instructions.  Patient tolerated procedure well.

## 2020-09-08 NOTE — Assessment & Plan Note (Signed)
Annual physical exam with labs performed today.  Discussion of routine health maintenance and lifestyle recommendations to improve overall health and prevent chronic conditions.  Plan to follow-up in 6 months for check on chronic conditions.

## 2020-09-08 NOTE — Patient Instructions (Addendum)
Recommendations from today:  [name] your labs looked good. Your potassium was slightly low- I recommend increasing potassium rich foods I have included more information below.   Your hemoglobin A1c was elevated to 5.7%, this is indicative of pre-diabetes and means that you are at risk for developing diabetes. I recommend decreasing the amount of carbohydrates you eat as well as saturated fats to see if we can lower your risks. I have included more information below.  Cut your dose of losartan-hctz in 1/2 and monitor your BP so we can see how you are doing with that.  If it starts to go above 130/80, let me know.   Keep me posted on your mood. We can follow-up in 3-6 months or sooner if you need to.    Hypokalemia Hypokalemia means that the amount of potassium in the blood is lower than normal. Potassium is a chemical (electrolyte) that helps regulate the amount of fluid in the body. It also stimulates muscle tightening (contraction) and helps nerves work properly. Normally, most of the body's potassium is inside cells, and only a very small amount is in the blood. Because the amount in the blood is so small, minor changes to potassium levels in the blood can be life-threatening. What are the causes? This condition may be caused by:  Antibiotic medicine.  Diarrhea or vomiting. Taking too much of a medicine that helps you have a bowel movement (laxative) can cause diarrhea and lead to hypokalemia.  Chronic kidney disease (CKD).  Medicines that help the body get rid of excess fluid (diuretics).  Eating disorders, such as bulimia.  Low magnesium levels in the body.  Sweating a lot. What are the signs or symptoms? Symptoms of this condition include:  Weakness.  Constipation.  Fatigue.  Muscle cramps.  Mental confusion.  Skipped heartbeats or irregular heartbeat (palpitations).  Tingling or numbness. How is this diagnosed? This condition is diagnosed with a blood  test. How is this treated? This condition may be treated by:  Taking potassium supplements by mouth.  Adjusting the medicines that you take.  Eating more foods that contain a lot of potassium. If your potassium level is very low, you may need to get potassium through an IV and be monitored in the hospital. Follow these instructions at home:  Take over-the-counter and prescription medicines only as told by your health care provider. This includes vitamins and supplements.  Eat a healthy diet. A healthy diet includes fresh fruits and vegetables, whole grains, healthy fats, and lean proteins.  If instructed, eat more foods that contain a lot of potassium. This includes: ? Nuts, such as peanuts and pistachios. ? Seeds, such as sunflower seeds and pumpkin seeds. ? Peas, lentils, and lima beans. ? Whole grain and bran cereals and breads. ? Fresh fruits and vegetables, such as apricots, avocado, bananas, cantaloupe, kiwi, oranges, tomatoes, asparagus, and potatoes. ? Orange juice. ? Tomato juice. ? Red meats. ? Yogurt.  Keep all follow-up visits as told by your health care provider. This is important.   Contact a health care provider if you:  Have weakness that gets worse.  Feel your heart pounding or racing.  Vomit.  Have diarrhea.  Have diabetes (diabetes mellitus) and you have trouble keeping your blood sugar (glucose) in your target range. Get help right away if you:  Have chest pain.  Have shortness of breath.  Have vomiting or diarrhea that lasts for more than 2 days.  Faint. Summary  Hypokalemia means that the amount  of potassium in the blood is lower than normal.  This condition is diagnosed with a blood test.  Hypokalemia may be treated by taking potassium supplements, adjusting the medicines that you take, or eating more foods that are high in potassium.  If your potassium level is very low, you may need to get potassium through an IV and be monitored in the  hospital. This information is not intended to replace advice given to you by your health care provider. Make sure you discuss any questions you have with your health care provider. Document Revised: 12/31/2017 Document Reviewed: 12/31/2017 Elsevier Patient Education  2021 [place].   High Cholesterol  High cholesterol is a condition in which the blood has high levels of a white, waxy substance similar to fat (cholesterol). The liver makes all the cholesterol that the body needs. The human body needs small amounts of cholesterol to help build cells. A person gets extra or excess cholesterol from the food that he or she eats. The blood carries cholesterol from the liver to the rest of the body. If you have high cholesterol, deposits (plaques) may build up on the walls of your arteries. Arteries are the blood vessels that carry blood away from your heart. These plaques make the arteries narrow and stiff. Cholesterol plaques increase your risk for heart attack and stroke. Work with your health care provider to keep your cholesterol levels in a healthy range. What increases the risk? The following factors may make you more likely to develop this condition: Eating foods that are high in animal fat (saturated fat) or cholesterol. Being overweight. Not getting enough exercise. A family history of high cholesterol (familial hypercholesterolemia). Use of tobacco products. Having diabetes. What are the signs or symptoms? There are no symptoms of this condition. How is this diagnosed? This condition may be diagnosed based on the results of a blood test. If you are older than 59 years of age, your health care provider may check your cholesterol levels every 4-6 years. You may be checked more often if you have high cholesterol or other risk factors for heart disease. The blood test for cholesterol measures: "Bad" cholesterol, or LDL cholesterol. This is the main type of cholesterol that causes heart  disease. The desired level is less than 100 mg/dL. "Good" cholesterol, or HDL cholesterol. HDL helps protect against heart disease by cleaning the arteries and carrying the LDL to the liver for processing. The desired level for HDL is 60 mg/dL or higher. Triglycerides. These are fats that your body can store or burn for energy. The desired level is less than 150 mg/dL. Total cholesterol. This measures the total amount of cholesterol in your blood and includes LDL, HDL, and triglycerides. The desired level is less than 200 mg/dL. How is this treated? This condition may be treated with: Diet changes. You may be asked to eat foods that have more fiber and less saturated fats or added sugar. Lifestyle changes. These may include regular exercise, maintaining a healthy weight, and quitting use of tobacco products. Medicines. These are given when diet and lifestyle changes have not worked. You may be prescribed a statin medicine to help lower your cholesterol levels. Follow these instructions at home: Eating and drinking Eat a healthy, balanced diet. This diet includes: Daily servings of a variety of fresh, frozen, or canned fruits and vegetables. Daily servings of whole grain foods that are rich in fiber. Foods that are low in saturated fats and trans fats. These include poultry and  fish without skin, lean cuts of meat, and low-fat dairy products. A variety of fish, especially oily fish that contain omega-3 fatty acids. Aim to eat fish at least 2 times a week. Avoid foods and drinks that have added sugar. Use healthy cooking methods, such as roasting, grilling, broiling, baking, poaching, steaming, and stir-frying. Do not fry your food except for stir-frying.   Lifestyle Get regular exercise. Aim to exercise for a total of 150 minutes a week. Increase your activity level by doing activities such as gardening, walking, and taking the stairs. Do not use any products that contain nicotine or tobacco, such  as cigarettes, e-cigarettes, and chewing tobacco. If you need help quitting, ask your health care provider.   General instructions Take over-the-counter and prescription medicines only as told by your health care provider. Keep all follow-up visits as told by your health care provider. This is important. Where to find more information American Heart Association: www.heart.org National Heart, Lung, and Blood Institute: https://wilson-eaton.com/ Contact a health care provider if: You have trouble achieving or maintaining a healthy diet or weight. You are starting an exercise program. You are unable to stop smoking. Get help right away if: You have chest pain. You have trouble breathing. You have any symptoms of a stroke. "BE FAST" is an easy way to remember the main warning signs of a stroke: B - Balance. Signs are dizziness, sudden trouble walking, or loss of balance. E - Eyes. Signs are trouble seeing or a sudden change in vision. F - Face. Signs are sudden weakness or numbness of the face, or the face or eyelid drooping on one side. A - Arms. Signs are weakness or numbness in an arm. This happens suddenly and usually on one side of the body. S - Speech. Signs are sudden trouble speaking, slurred speech, or trouble understanding what people say. T - Time. Time to call emergency services. Write down what time symptoms started. You have other signs of a stroke, such as: A sudden, severe headache with no known cause. Nausea or vomiting. Seizure. These symptoms may represent a serious problem that is an emergency. Do not wait to see if the symptoms will go away. Get medical help right away. Call your local emergency services (911 in the [place] Do not drive yourself to the hospital. Summary Cholesterol plaques increase your risk for heart attack and stroke. Work with your health care provider to keep your cholesterol levels in a healthy range. Eat a healthy, balanced diet, get regular exercise, and  maintain a healthy weight. Do not use any products that contain nicotine or tobacco, such as cigarettes, e-cigarettes, and chewing tobacco. Get help right away if you have any symptoms of a stroke. This information is not intended to replace advice given to you by your health care provider. Make sure you discuss any questions you have with your health care provider. Document Revised: 04/19/2019 Document Reviewed: 04/19/2019 Elsevier Patient Education  2021 [place].   Preventing Diabetes Mellitus Complications You can help to prevent or slow down problems that are caused by diabetes (diabetes mellitus). Following your diabetes plan and taking care of yourself can reduce your risk of serious or life-threatening complications. What actions can I take to prevent diabetes complications? Diabetes management  Follow instructions from your health care providers about managing your diabetes. Your diabetes may be managed by a team of health care providers who can teach you how to care for yourself and can answer questions that you have.  Educate yourself about your condition so you can make healthy choices about eating and physical activity.  Know your target range for your blood sugar (glucose), and check your blood glucose level as often as told. Your health care provider will help you decide how often to check your blood glucose level depending on your treatment goals and how well you are meeting them.  Ask your health care provider if you should take low-dose aspirin daily and what dose is recommended for you. Taking low-dose aspirin daily is recommended to help prevent cardiovascular disease.   Controlling your blood pressure and cholesterol Your personal target blood pressure is determined based on:  Your age.  Your medicines.  How long you have had diabetes.  Any other medical conditions you have. To control your blood pressure:  Follow instructions from your health care provider  about meal planning, exercise, and medicines.  Make sure your health care provider checks your blood pressure at every medical visit.  Monitor your blood pressure at home as told by your health care provider. To control your cholesterol:  Follow instructions from your health care provider about meal planning, exercise, and medicines.  Have your cholesterol checked at least once a year.  You may be prescribed medicine to lower cholesterol (statin). If you are not taking a statin, ask your health care provider if you should be. Controlling your cholesterol may:  Help prevent heart disease and stroke. These are the most common health problems for people with diabetes.  Improve your blood flow.   Medical appointments and vaccines Schedule and keep yearly physical exams and eye exams. Your health care provider will tell you how often you need medical visits depending on your diabetes management plan. Keep all follow-up visits as told. This is important so possible problems can be identified Sue [name] and complications can be avoided or treated.  Every visit with your health care provider should include measuring your: ? Weight. ? Blood pressure. ? Blood glucose control.  Your A1C (hemoglobin A1C) level should be checked: ? At least 2 times a year, if you are meeting your treatment goals. ? 4 times a year, if you are not meeting treatment goals or if your treatment goals have changed.  Your blood lipids (lipid profile) should be checked yearly. You should also be checked yearly for protein in your urine (urine microalbumin).  If you have type 1 diabetes, get an eye exam 3-5 years after you are diagnosed, and then once a year after your first exam.  If you have type 2 diabetes, get an eye exam as soon as you are diagnosed, and then once a year after your first exam. It is also important to keep your vaccines current. It is recommended that you receive:  A flu (influenza) vaccine every  year.  A pneumonia (pneumococcal) vaccine and a hepatitis B vaccine. If you are age 18 or older, you may get the pneumonia vaccine as a series of two separate shots. Ask your health care provider which other vaccines may be recommended. Lifestyle  Do not use any products that contain nicotine or tobacco, such as cigarettes, e-cigarettes, and chewing tobacco. If you need help quitting, ask your health care provider. By avoiding nicotine and tobacco: ? You will lower your risk for heart attack, stroke, nerve disease, and kidney disease. ? Your cholesterol and blood pressure may improve. ? Your blood circulation will improve.  If you drink alcohol: ? Limit how much you use to:  0-1 drink a  day for women who are not pregnant.  0-2 drinks a day for men. ? Be aware of how much alcohol is in your drink. In the [place] one drink equals one 12 oz bottle of beer (355 mL), one 5 oz glass of wine (148 mL), or one 11?2 oz glass of hard liquor (44 mL). Taking care of your feet Diabetes may cause you to have poor blood circulation to your legs and feet. Because of this, taking care of your feet is very important. Diabetes can cause:  The skin on the feet to get thinner, break more easily, and heal more slowly.  Nerve damage in your legs and feet, which results in decreased feeling. You may not notice minor injuries that could lead to serious problems. To avoid foot problems:  Check your skin and feet every day for cuts, bruises, redness, blisters, or sores.  Schedule a foot exam with your health care provider once every year. This exam includes: ? Inspecting the structure and skin of your feet. ? Checking the pulses and sensation in your feet.  Make sure that your health care provider performs a visual foot exam at every medical visit.   Taking care of your teeth People with poorly controlled diabetes are more likely to have gum (periodontal) disease. Diabetes can make periodontal diseases harder to  control. If not treated, periodontal diseases can lead to tooth loss. To prevent this:  Brush your teeth twice a day.  Floss at least once a day.  Visit your dentist 2 times a year. Managing stress Living with diabetes can be stressful. When you are experiencing stress, your blood glucose may be affected in two ways:  Stress hormones may cause your blood glucose to rise.  You may be distracted from taking good care of yourself. Be aware of your stress level and make changes to help you manage challenging situations. To lower your stress levels:  Consider joining a support group.  Do planned relaxation or meditation.  Do a hobby that you enjoy.  Maintain healthy relationships.  Exercise regularly.  Work with your health care provider or a mental health professional. Where to find more information  American Diabetes Association: www.diabetes.org  Association of Diabetes Care and Education Specialists: www.diabeteseducator.org Summary  You can take action to prevent or slow down problems that are caused by diabetes (diabetes mellitus). Following your diabetes plan and taking care of yourself can reduce your risk of serious or life-threatening complications.  Follow instructions from your health care providers about managing your diabetes. Your diabetes may be managed by a team of health care providers who can teach you how to care for yourself and can answer questions that you have.  Know your target range for your blood sugar (glucose), and check your blood glucose levels as often as told. Your health care provider will help you decide how often you should check your blood glucose level depending on your treatment goals and how well you are meeting them.  Your health care provider will tell you how often you need medical visits depending on your diabetes management plan. Keep all follow-up visits as directed. This is important so possible problems can be identified Sue [name] and  complications can be avoided or treated. This information is not intended to replace advice given to you by your health care provider. Make sure you discuss any questions you have with your health care provider. Document Revised: 07/09/2019 Document Reviewed: 07/09/2019 Elsevier Patient Education  [place].   Osteoarthritis  Osteoarthritis is a type of arthritis. It refers to joint pain or joint disease. Osteoarthritis affects tissue that covers the ends of bones in joints (cartilage). Cartilage acts as a cushion between the bones and helps them move smoothly. Osteoarthritis occurs when cartilage in the joints gets worn down. Osteoarthritis is sometimes called "wear and tear" arthritis. Osteoarthritis is the most common form of arthritis. It often occurs in older people. It is a condition that gets worse over time. The joints most often affected by this condition are in the fingers, toes, hips, knees, and spine, including the neck and lower back. What are the causes? This condition is caused by the wearing down of cartilage that covers the ends of bones. What increases the risk? The following factors may make you more likely to develop this condition:  Being age 71 or older.  Obesity.  Overuse of joints.  Past injury of a joint.  Past surgery on a joint.  Family history of osteoarthritis. What are the signs or symptoms? The main symptoms of this condition are pain, swelling, and stiffness in the joint. Other symptoms may include:  An enlarged joint.  More pain and further damage caused by small pieces of bone or cartilage that break off and float inside of the joint.  Small deposits of bone (osteophytes) that grow on the edges of the joint.  A grating or scraping feeling inside the joint when you move it.  Popping or creaking sounds when you move.  Difficulty walking or exercising.  An inability to grip items, twist your hand(s), or control the movements of your  hands and fingers. How is this diagnosed? This condition may be diagnosed based on:  Your medical history.  A physical exam.  Your symptoms.  X-rays of the affected joint(s).  Blood tests to rule out other types of arthritis. How is this treated? There is no cure for this condition, but treatment can help control pain and improve joint function. Treatment may include a combination of therapies, such as:  Pain relief techniques, such as: ? Applying heat and cold to the joint. ? Massage. ? A form of talk therapy called cognitive behavioral therapy (CBT). This therapy helps you set goals and follow up on the changes that you make.  Medicines for pain and inflammation. The medicines can be taken by mouth or applied to the skin. They include: ? NSAIDs, such as ibuprofen. ? Prescription medicines. ? Strong anti-inflammatory medicines (corticosteroids). ? Certain nutritional supplements.  A prescribed exercise program. You may work with a physical therapist.  Assistive devices, such as a brace, wrap, splint, specialized glove, or cane.  A weight control plan.  Surgery, such as: ? An osteotomy. This is done to reposition the bones and relieve pain or to remove loose pieces of bone and cartilage. ? Joint replacement surgery. You may need this surgery if you have advanced osteoarthritis. Follow these instructions at home: Activity  Rest your affected joints as told by your health care provider.  Exercise as told by your health care provider. He or she may recommend specific types of exercise, such as: ? Strengthening exercises. These are done to strengthen the muscles that support joints affected by arthritis. ? Aerobic activities. These are exercises, such as brisk walking or water aerobics, that increase your heart rate. ? Range-of-motion activities. These help your joints move more easily. ? Balance and agility exercises. Managing pain, stiffness, and swelling  If directed,  apply heat to the affected area as often as  told by your health care provider. Use the heat source that your health care provider recommends, such as a moist heat pack or a heating pad. ? If you have a removable assistive device, remove it as told by your health care provider. ? Place a towel between your skin and the heat source. If your health care provider tells you to keep the assistive device on while you apply heat, place a towel between the assistive device and the heat source. ? Leave the heat on for 20-30 minutes. ? Remove the heat if your skin turns bright red. This is especially important if you are unable to feel pain, heat, or cold. You may have a greater risk of getting burned.  If directed, put ice on the affected area. To do this: ? If you have a removable assistive device, remove it as told by your health care provider. ? Put ice in a plastic bag. ? Place a towel between your skin and the bag. If your health care provider tells you to keep the assistive device on during icing, place a towel between the assistive device and the bag. ? Leave the ice on for 20 minutes, 2-3 times a day. ? Move your fingers or toes often to reduce stiffness and swelling. ? Raise (elevate) the injured area above the level of your heart while you are sitting or lying down.      General instructions  Take over-the-counter and prescription medicines only as told by your health care provider.  Maintain a healthy weight. Follow instructions from your health care provider for weight control.  Do not use any products that contain nicotine or tobacco, such as cigarettes, e-cigarettes, and chewing tobacco. If you need help quitting, ask your health care provider.  Use assistive devices as told by your health care provider.  Keep all follow-up visits as told by your health care provider. This is important. Where to find more information  [employer] of Arthritis and Musculoskeletal and Skin  Diseases: www.niams.SouthExposed.es  [employer] on Aging: http://kim-miller.com/  American College of Rheumatology: www.rheumatology.org Contact a health care provider if:  You have redness, swelling, or a feeling of warmth in a joint that gets worse.  You have a fever along with joint or muscle aches.  You develop a rash.  You have trouble doing your normal activities. Get help right away if:  You have pain that gets worse and is not relieved by pain medicine. Summary  Osteoarthritis is a type of arthritis that affects tissue covering the ends of bones in joints (cartilage).  This condition is caused by the wearing down of cartilage that covers the ends of bones.  The main symptom of this condition is pain, swelling, and stiffness in the joint.  There is no cure for this condition, but treatment can help control pain and improve joint function. This information is not intended to replace advice given to you by your health care provider. Make sure you discuss any questions you have with your health care provider. Document Revised: 05/17/2019 Document Reviewed: 05/17/2019 Elsevier Patient Education  2021 [employer].

## 2020-09-08 NOTE — Assessment & Plan Note (Signed)
Thyroid levels WNL. Continue levothyroxine 38mcg daily.  Will recheck in 12 months or sooner if needed.

## 2020-09-08 NOTE — Assessment & Plan Note (Signed)
Potassium slightly decreased on recent lab reports. We are cutting back HCTZ today and recommend increasing foods high in potassium to help prevent excessive imbalance.  She is not experiencing any symptoms or concerns at this time.  She has an intense fear of needles with vasovagal reactions to blood draw, therefore will try to limit monitoring of blood if possible.  Monitor for symptoms of hypokalemia and follow-up if any concerns present. Will plan to recheck labs in 6 months.

## 2020-09-08 NOTE — Assessment & Plan Note (Signed)
LDL elevated on recent labs. Unfortunately HDL was not monitored on this test. Historically she has had elevated LDL and elevated HDL with good counterbalance for cholesterol management.  Recommend monitoring saturated fat intake and continue with daily exercise.  Will plan to recheck in 6 months for LDL and HDL levels.

## 2020-09-08 NOTE — Assessment & Plan Note (Signed)
Anxiety much better controlled with increase in sertraline to 75mg . Will plan to continue on this dose and follow-up in approximately 6 months or sooner if needed.  She is planning to schedule with therapy in the near future- encouraged her to continue with this.  Recommend follow-up if anything changes.

## 2020-09-11 ENCOUNTER — Encounter (HOSPITAL_BASED_OUTPATIENT_CLINIC_OR_DEPARTMENT_OTHER): Payer: No Typology Code available for payment source | Admitting: Nurse Practitioner

## 2020-09-19 ENCOUNTER — Other Ambulatory Visit (HOSPITAL_COMMUNITY): Payer: Self-pay

## 2020-09-19 MED FILL — Omeprazole Cap Delayed Release 20 MG: ORAL | 30 days supply | Qty: 60 | Fill #0 | Status: AC

## 2020-09-28 ENCOUNTER — Encounter (HOSPITAL_BASED_OUTPATIENT_CLINIC_OR_DEPARTMENT_OTHER): Payer: Self-pay | Admitting: Nurse Practitioner

## 2020-09-28 ENCOUNTER — Other Ambulatory Visit: Payer: Self-pay | Admitting: Internal Medicine

## 2020-09-28 ENCOUNTER — Other Ambulatory Visit (HOSPITAL_BASED_OUTPATIENT_CLINIC_OR_DEPARTMENT_OTHER): Payer: Self-pay | Admitting: Nurse Practitioner

## 2020-09-28 ENCOUNTER — Other Ambulatory Visit (HOSPITAL_COMMUNITY): Payer: Self-pay

## 2020-09-28 ENCOUNTER — Other Ambulatory Visit: Payer: Self-pay | Admitting: Nurse Practitioner

## 2020-09-28 DIAGNOSIS — F419 Anxiety disorder, unspecified: Secondary | ICD-10-CM

## 2020-09-28 DIAGNOSIS — F332 Major depressive disorder, recurrent severe without psychotic features: Secondary | ICD-10-CM

## 2020-09-28 MED ORDER — SERTRALINE HCL 25 MG PO TABS
25.0000 mg | ORAL_TABLET | Freq: Every day | ORAL | 2 refills | Status: DC
  Filled 2020-09-28: qty 30, 30d supply, fill #0
  Filled 2020-11-06: qty 30, 30d supply, fill #1
  Filled 2020-12-07: qty 30, 30d supply, fill #2

## 2020-09-28 MED ORDER — SERTRALINE HCL 50 MG PO TABS
50.0000 mg | ORAL_TABLET | Freq: Every day | ORAL | 2 refills | Status: DC
  Filled 2020-09-28: qty 30, 30d supply, fill #0
  Filled 2020-11-06: qty 30, 30d supply, fill #1
  Filled 2020-12-07: qty 30, 30d supply, fill #2

## 2020-09-29 ENCOUNTER — Other Ambulatory Visit (HOSPITAL_COMMUNITY): Payer: Self-pay

## 2020-10-02 ENCOUNTER — Other Ambulatory Visit (HOSPITAL_COMMUNITY): Payer: Self-pay

## 2020-10-02 ENCOUNTER — Other Ambulatory Visit (HOSPITAL_BASED_OUTPATIENT_CLINIC_OR_DEPARTMENT_OTHER): Payer: Self-pay | Admitting: Nurse Practitioner

## 2020-10-02 ENCOUNTER — Other Ambulatory Visit: Payer: Self-pay | Admitting: Nurse Practitioner

## 2020-10-02 DIAGNOSIS — I10 Essential (primary) hypertension: Secondary | ICD-10-CM

## 2020-10-02 MED ORDER — LOSARTAN POTASSIUM-HCTZ 50-12.5 MG PO TABS
0.5000 | ORAL_TABLET | Freq: Every day | ORAL | 3 refills | Status: DC
Start: 2020-10-02 — End: 2021-09-03
  Filled 2020-10-02: qty 45, 90d supply, fill #0
  Filled 2020-12-12: qty 45, 90d supply, fill #1
  Filled 2021-01-10 – 2021-03-09 (×2): qty 45, 90d supply, fill #2
  Filled 2021-06-06: qty 45, 90d supply, fill #3

## 2020-10-18 ENCOUNTER — Other Ambulatory Visit: Payer: Self-pay | Admitting: Nurse Practitioner

## 2020-10-18 ENCOUNTER — Other Ambulatory Visit (HOSPITAL_COMMUNITY): Payer: Self-pay

## 2020-10-18 ENCOUNTER — Other Ambulatory Visit: Payer: Self-pay | Admitting: Internal Medicine

## 2020-10-18 MED FILL — Omeprazole Cap Delayed Release 20 MG: ORAL | 30 days supply | Qty: 60 | Fill #1 | Status: CN

## 2020-10-20 ENCOUNTER — Other Ambulatory Visit (HOSPITAL_COMMUNITY): Payer: Self-pay

## 2020-10-20 ENCOUNTER — Other Ambulatory Visit (HOSPITAL_BASED_OUTPATIENT_CLINIC_OR_DEPARTMENT_OTHER): Payer: Self-pay | Admitting: Nurse Practitioner

## 2020-10-20 DIAGNOSIS — E039 Hypothyroidism, unspecified: Secondary | ICD-10-CM

## 2020-10-20 DIAGNOSIS — F5101 Primary insomnia: Secondary | ICD-10-CM

## 2020-10-20 DIAGNOSIS — K219 Gastro-esophageal reflux disease without esophagitis: Secondary | ICD-10-CM

## 2020-10-20 MED ORDER — OMEPRAZOLE 20 MG PO CPDR
20.0000 mg | DELAYED_RELEASE_CAPSULE | Freq: Two times a day (BID) | ORAL | 11 refills | Status: DC
  Filled 2020-10-20: qty 60, 30d supply, fill #0
  Filled 2020-11-20: qty 60, 30d supply, fill #1
  Filled 2020-12-20: qty 60, 30d supply, fill #2
  Filled 2021-01-17: qty 60, 30d supply, fill #3
  Filled 2021-02-19: qty 60, 30d supply, fill #4
  Filled 2021-03-20: qty 60, 30d supply, fill #5
  Filled 2021-04-19: qty 60, 30d supply, fill #6
  Filled 2021-05-22: qty 60, 30d supply, fill #7
  Filled 2021-06-20: qty 60, 30d supply, fill #8
  Filled 2021-07-26: qty 60, 30d supply, fill #9
  Filled 2021-08-24: qty 60, 30d supply, fill #10
  Filled 2021-09-25: qty 60, 30d supply, fill #11

## 2020-10-20 MED ORDER — TRAZODONE HCL 50 MG PO TABS
25.0000 mg | ORAL_TABLET | Freq: Every evening | ORAL | 1 refills | Status: DC | PRN
  Filled 2020-10-20: qty 30, 30d supply, fill #0
  Filled 2021-01-30: qty 30, 30d supply, fill #1

## 2020-10-20 MED FILL — Levothyroxine Sodium Tab 25 MCG: ORAL | 90 days supply | Qty: 90 | Fill #0 | Status: AC

## 2020-11-06 ENCOUNTER — Other Ambulatory Visit (HOSPITAL_COMMUNITY): Payer: Self-pay

## 2020-11-20 ENCOUNTER — Other Ambulatory Visit (HOSPITAL_COMMUNITY): Payer: Self-pay

## 2020-11-23 ENCOUNTER — Encounter (HOSPITAL_BASED_OUTPATIENT_CLINIC_OR_DEPARTMENT_OTHER): Payer: Self-pay | Admitting: Nurse Practitioner

## 2020-12-07 ENCOUNTER — Other Ambulatory Visit (HOSPITAL_COMMUNITY): Payer: Self-pay

## 2020-12-08 NOTE — Telephone Encounter (Unsigned)
I called and spoke with Aldean Ast at [facility].  They are going to reprocess the claim and Aldean Ast is supposed to contact me in 14 days to let me know this has been taken care of.  [id number]

## 2020-12-12 ENCOUNTER — Other Ambulatory Visit (HOSPITAL_COMMUNITY): Payer: Self-pay

## 2020-12-13 ENCOUNTER — Other Ambulatory Visit (HOSPITAL_COMMUNITY): Payer: Self-pay

## 2020-12-20 ENCOUNTER — Other Ambulatory Visit (HOSPITAL_COMMUNITY): Payer: Self-pay

## 2021-01-04 ENCOUNTER — Other Ambulatory Visit (HOSPITAL_COMMUNITY): Payer: Self-pay

## 2021-01-04 ENCOUNTER — Other Ambulatory Visit (HOSPITAL_BASED_OUTPATIENT_CLINIC_OR_DEPARTMENT_OTHER): Payer: Self-pay | Admitting: Nurse Practitioner

## 2021-01-04 DIAGNOSIS — F332 Major depressive disorder, recurrent severe without psychotic features: Secondary | ICD-10-CM

## 2021-01-05 ENCOUNTER — Other Ambulatory Visit (HOSPITAL_BASED_OUTPATIENT_CLINIC_OR_DEPARTMENT_OTHER): Payer: Self-pay | Admitting: Nurse Practitioner

## 2021-01-05 ENCOUNTER — Other Ambulatory Visit (HOSPITAL_COMMUNITY): Payer: Self-pay

## 2021-01-05 DIAGNOSIS — F332 Major depressive disorder, recurrent severe without psychotic features: Secondary | ICD-10-CM

## 2021-01-08 ENCOUNTER — Other Ambulatory Visit (HOSPITAL_COMMUNITY): Payer: Self-pay

## 2021-01-08 ENCOUNTER — Other Ambulatory Visit (HOSPITAL_BASED_OUTPATIENT_CLINIC_OR_DEPARTMENT_OTHER): Payer: Self-pay | Admitting: Nurse Practitioner

## 2021-01-08 DIAGNOSIS — F332 Major depressive disorder, recurrent severe without psychotic features: Secondary | ICD-10-CM

## 2021-01-09 ENCOUNTER — Other Ambulatory Visit (HOSPITAL_BASED_OUTPATIENT_CLINIC_OR_DEPARTMENT_OTHER): Payer: Self-pay | Admitting: Nurse Practitioner

## 2021-01-09 ENCOUNTER — Other Ambulatory Visit (HOSPITAL_COMMUNITY): Payer: Self-pay

## 2021-01-09 ENCOUNTER — Encounter (HOSPITAL_BASED_OUTPATIENT_CLINIC_OR_DEPARTMENT_OTHER): Payer: Self-pay | Admitting: Nurse Practitioner

## 2021-01-09 DIAGNOSIS — F332 Major depressive disorder, recurrent severe without psychotic features: Secondary | ICD-10-CM

## 2021-01-09 MED ORDER — SERTRALINE HCL 25 MG PO TABS
25.0000 mg | ORAL_TABLET | Freq: Every day | ORAL | 11 refills | Status: DC
Start: 2021-01-09 — End: 2021-03-19
  Filled 2021-01-09: qty 30, 30d supply, fill #0
  Filled 2021-02-06: qty 30, 30d supply, fill #1
  Filled 2021-03-09: qty 30, 30d supply, fill #2

## 2021-01-10 ENCOUNTER — Other Ambulatory Visit (HOSPITAL_BASED_OUTPATIENT_CLINIC_OR_DEPARTMENT_OTHER): Payer: Self-pay

## 2021-01-10 ENCOUNTER — Other Ambulatory Visit (HOSPITAL_COMMUNITY): Payer: Self-pay

## 2021-01-10 DIAGNOSIS — F419 Anxiety disorder, unspecified: Secondary | ICD-10-CM

## 2021-01-10 DIAGNOSIS — F331 Major depressive disorder, recurrent, moderate: Secondary | ICD-10-CM

## 2021-01-10 MED ORDER — SERTRALINE HCL 50 MG PO TABS
50.0000 mg | ORAL_TABLET | Freq: Every day | ORAL | 11 refills | Status: DC
  Filled 2021-01-10: qty 30, 30d supply, fill #0
  Filled 2021-02-06: qty 30, 30d supply, fill #1
  Filled 2021-03-09: qty 30, 30d supply, fill #2

## 2021-01-16 ENCOUNTER — Ambulatory Visit (HOSPITAL_BASED_OUTPATIENT_CLINIC_OR_DEPARTMENT_OTHER): Payer: No Typology Code available for payment source | Admitting: Radiology

## 2021-01-17 ENCOUNTER — Other Ambulatory Visit (HOSPITAL_COMMUNITY): Payer: Self-pay

## 2021-01-17 MED FILL — Levothyroxine Sodium Tab 25 MCG: ORAL | 90 days supply | Qty: 90 | Fill #1 | Status: AC

## 2021-01-25 ENCOUNTER — Ambulatory Visit (HOSPITAL_BASED_OUTPATIENT_CLINIC_OR_DEPARTMENT_OTHER)
Admission: RE | Admit: 2021-01-25 | Discharge: 2021-01-25 | Disposition: A | Discharge status: Home / Self Care | Payer: No Typology Code available for payment source | Source: Ambulatory Visit | Attending: Nurse Practitioner | Admitting: Nurse Practitioner

## 2021-01-25 ENCOUNTER — Other Ambulatory Visit: Payer: Self-pay

## 2021-01-25 DIAGNOSIS — Z1231 Encounter for screening mammogram for malignant neoplasm of breast: Secondary | ICD-10-CM | POA: Insufficient documentation

## 2021-01-30 ENCOUNTER — Other Ambulatory Visit (HOSPITAL_COMMUNITY): Payer: Self-pay

## 2021-01-31 ENCOUNTER — Encounter (HOSPITAL_BASED_OUTPATIENT_CLINIC_OR_DEPARTMENT_OTHER): Payer: Self-pay | Admitting: Nurse Practitioner

## 2021-02-06 ENCOUNTER — Other Ambulatory Visit (HOSPITAL_COMMUNITY): Payer: Self-pay

## 2021-02-19 ENCOUNTER — Other Ambulatory Visit (HOSPITAL_COMMUNITY): Payer: Self-pay

## 2021-02-22 ENCOUNTER — Telehealth (HOSPITAL_BASED_OUTPATIENT_CLINIC_OR_DEPARTMENT_OTHER): Payer: Self-pay

## 2021-02-22 NOTE — Telephone Encounter (Signed)
-----   Message from Orma Render, NP sent at 02/14/2021 10:59 AM EDT ----- Mammogram results normal. Please plan to follow-up in 1 year unless you have any new issues or symptoms before then.  Hope you have a great day!  SaraBeth

## 2021-02-22 NOTE — Telephone Encounter (Signed)
Results reviewed by patient via [place].  Seen on 02/14/2021 11:18 AM. Instructed patient to contact the office with any questions or concerns.

## 2021-03-06 ENCOUNTER — Encounter (HOSPITAL_BASED_OUTPATIENT_CLINIC_OR_DEPARTMENT_OTHER): Payer: Self-pay | Admitting: Nurse Practitioner

## 2021-03-08 ENCOUNTER — Other Ambulatory Visit (HOSPITAL_BASED_OUTPATIENT_CLINIC_OR_DEPARTMENT_OTHER): Payer: Self-pay | Admitting: Nurse Practitioner

## 2021-03-08 DIAGNOSIS — I10 Essential (primary) hypertension: Secondary | ICD-10-CM

## 2021-03-08 DIAGNOSIS — E876 Hypokalemia: Secondary | ICD-10-CM

## 2021-03-08 DIAGNOSIS — E785 Hyperlipidemia, unspecified: Secondary | ICD-10-CM

## 2021-03-08 DIAGNOSIS — E039 Hypothyroidism, unspecified: Secondary | ICD-10-CM

## 2021-03-08 DIAGNOSIS — R7303 Prediabetes: Secondary | ICD-10-CM

## 2021-03-09 ENCOUNTER — Other Ambulatory Visit (HOSPITAL_COMMUNITY): Payer: Self-pay

## 2021-03-14 ENCOUNTER — Ambulatory Visit (HOSPITAL_BASED_OUTPATIENT_CLINIC_OR_DEPARTMENT_OTHER): Payer: No Typology Code available for payment source

## 2021-03-14 ENCOUNTER — Other Ambulatory Visit: Payer: Self-pay

## 2021-03-14 DIAGNOSIS — I10 Essential (primary) hypertension: Secondary | ICD-10-CM

## 2021-03-14 DIAGNOSIS — R7303 Prediabetes: Secondary | ICD-10-CM

## 2021-03-14 DIAGNOSIS — E876 Hypokalemia: Secondary | ICD-10-CM

## 2021-03-14 DIAGNOSIS — E785 Hyperlipidemia, unspecified: Secondary | ICD-10-CM

## 2021-03-14 LAB — LIPID PANEL
Chol/HDL Ratio: 3.2 ratio (ref 0.0–4.4)
Cholesterol, Total: 219 mg/dL — ABNORMAL HIGH (ref 100–199)
HDL: 69 mg/dL (ref >39)
LDL Chol Calc (NIH): 133 mg/dL — ABNORMAL HIGH (ref 0–99)
Triglycerides: 99 mg/dL (ref 0–149)
VLDL Cholesterol Cal: 17 mg/dL (ref 5–40)

## 2021-03-17 LAB — COMPREHENSIVE METABOLIC PANEL
ALT: 11 IU/L (ref 0–32)
AST: 17 IU/L (ref 0–40)
Albumin/Globulin Ratio: 2.2 (ref 1.2–2.2)
Albumin: 4.4 g/dL (ref 3.8–4.9)
Alkaline Phosphatase: 70 IU/L (ref 44–121)
BUN/Creatinine Ratio: 26 — ABNORMAL HIGH (ref 9–23)
BUN: 16 mg/dL (ref 6–24)
Bilirubin Total: <0.2 mg/dL (ref 0.0–1.2)
CO2: 19 mmol/L — ABNORMAL LOW (ref 20–29)
Calcium: 9.6 mg/dL (ref 8.7–10.2)
Chloride: 103 mmol/L (ref 96–106)
Creatinine, Ser: 0.61 mg/dL (ref 0.57–1.00)
Globulin, Total: 2 g/dL (ref 1.5–4.5)
Glucose: 90 mg/dL (ref 70–99)
Potassium: 4.4 mmol/L (ref 3.5–5.2)
Sodium: 140 mmol/L (ref 134–144)
Total Protein: 6.4 g/dL (ref 6.0–8.5)
eGFR: 103 mL/min/{1.73_m2} (ref >59)

## 2021-03-17 LAB — SPECIMEN STATUS REPORT

## 2021-03-19 ENCOUNTER — Ambulatory Visit (INDEPENDENT_AMBULATORY_CARE_PROVIDER_SITE_OTHER): Payer: No Typology Code available for payment source | Admitting: Nurse Practitioner

## 2021-03-19 ENCOUNTER — Other Ambulatory Visit: Payer: Self-pay

## 2021-03-19 ENCOUNTER — Other Ambulatory Visit (HOSPITAL_COMMUNITY): Payer: Self-pay

## 2021-03-19 ENCOUNTER — Encounter (HOSPITAL_BASED_OUTPATIENT_CLINIC_OR_DEPARTMENT_OTHER): Payer: Self-pay | Admitting: Nurse Practitioner

## 2021-03-19 VITALS — BP 116/73 | HR 52 | Ht 62.0 in | Wt 138.2 lb

## 2021-03-19 DIAGNOSIS — F5101 Primary insomnia: Secondary | ICD-10-CM | POA: Diagnosis not present

## 2021-03-19 DIAGNOSIS — F419 Anxiety disorder, unspecified: Secondary | ICD-10-CM

## 2021-03-19 DIAGNOSIS — R7303 Prediabetes: Secondary | ICD-10-CM

## 2021-03-19 DIAGNOSIS — F339 Major depressive disorder, recurrent, unspecified: Secondary | ICD-10-CM

## 2021-03-19 DIAGNOSIS — I10 Essential (primary) hypertension: Secondary | ICD-10-CM

## 2021-03-19 DIAGNOSIS — E782 Mixed hyperlipidemia: Secondary | ICD-10-CM | POA: Diagnosis not present

## 2021-03-19 MED ORDER — CLONAZEPAM 0.5 MG PO TABS
0.5000 mg | ORAL_TABLET | ORAL | 0 refills | Status: DC | PRN
  Filled 2021-03-19: qty 15, 15d supply, fill #0

## 2021-03-19 MED ORDER — FLUOXETINE HCL 10 MG PO TABS
ORAL_TABLET | ORAL | 3 refills | Status: DC
  Filled 2021-03-19: qty 90, 48d supply, fill #0

## 2021-03-19 MED ORDER — TRAZODONE HCL 100 MG PO TABS
100.0000 mg | ORAL_TABLET | Freq: Every evening | ORAL | 3 refills | Status: DC | PRN
  Filled 2021-03-19: qty 90, 90d supply, fill #0
  Filled 2021-08-07: qty 90, 90d supply, fill #1
  Filled 2021-11-08: qty 90, 90d supply, fill #2
  Filled 2022-02-20: qty 90, 90d supply, fill #3

## 2021-03-19 NOTE — Assessment & Plan Note (Signed)
Improved with recent labs Review diet and activity recommendations

## 2021-03-19 NOTE — Assessment & Plan Note (Signed)
Trazodone 50mg  not effective. Patient has been taking 100mg  and this helps. Will write for 100mg  and monitor SSRI closely to avoid serotonin syndrome.

## 2021-03-19 NOTE — Assessment & Plan Note (Signed)
Recent BG normal on labs.  Will plan to recheck A1c in 6 months for evaluation.

## 2021-03-19 NOTE — Patient Instructions (Signed)
We will taper off of your sertraline and start the prozac back to see if this is more helpful for your symptoms.    Taper instructions- Starting today: Take 1 50mg  sertraline only for 7 days then stop.  Next Monday: Start Prozac 10mg  by mouth every morning. After 7 days, Increase to 20mg  (2 tabs) per day.

## 2021-03-19 NOTE — Progress Notes (Signed)
Established Patient Office Visit  Subjective:  Patient ID: Sue [name], female    DOB: 01/06/1961  Age: 60 y.o. MRN: [id number]  CC: No chief complaint on file.   HPI Sue [name] presents for F/U for HLD and anxiety.   Anxiety She feels like she cannot sit still even in the evening when she is home . She reports that she has had decreased appetite since last Christmas. She initially lost a lot of weight from not eating, but she reports she has put back on 10 lbs.     Past Medical History:  Diagnosis Date  . Anxiety   . Arthritis    Hands  . GERD (gastroesophageal reflux disease)   . Hypertension   . Hypothyroid   . Pigmented skin lesions 08/24/2020  . Rectal bleeding 08/24/2020    Past Surgical History:  Procedure Laterality Date  . INCISION AND DRAINAGE Right 09/03/2019   Procedure: INCISION AND DRAINAGE DOG BITE WOUND RIGHT HAND;  Surgeon: Daryll Brod, MD;  Location: [facility];  Service: Orthopedics;  Laterality: Right;  IV REGIONAL FOREARM BLOCK  . TONSILLECTOMY  3rd grade  . UPPER ESOPHAGEAL ENDOSCOPIC ULTRASOUND (EUS) N/A 02/25/2017   Procedure: UPPER ESOPHAGEAL ENDOSCOPIC ULTRASOUND (EUS);  Surgeon: Carol Ada, MD;  Location: [facility];  Service: Endoscopy;  Laterality: N/A;    Family History  Problem Relation Age of Onset  . Uterine cancer Maternal Grandmother   . Colon cancer Other        Paternal Event organiser  . Breast cancer Other        Maternal Great Grandmother  . Hypertension Father   . Heart attack Father   . Hypertension Brother   . Lupus Mother   . Osteoporosis Mother     Social History   Socioeconomic History  . Marital status: Single    Spouse name: Not on file  . Number of children: Not on file  . Years of education: Not on file  . Highest education level: Not on file  Occupational History  . Not on file  Tobacco Use  . Smoking status: Never  . Smokeless tobacco: Never  Vaping Use  . Vaping Use: Never used  Substance  and Sexual Activity  . Alcohol use: Not Currently    Comment: ocassional  . Drug use: No  . Sexual activity: Never    Partners: Male  Other Topics Concern  . Not on file  Social History Narrative  . Not on file   Social Determinants of Health   Financial Resource Strain: Not on file  Food Insecurity: Not on file  Transportation Needs: Not on file  Physical Activity: Not on file  Stress: Not on file  Social Connections: Not on file  Intimate Partner Violence: Not on file    Outpatient Medications Prior to Visit  Medication Sig Dispense Refill  . levothyroxine (SYNTHROID) 25 MCG tablet Take 1 tablet (25 mcg total) by mouth daily before breakfast. 90 tablet 2  . losartan-hydrochlorothiazide (HYZAAR) 50-12.5 MG tablet Take 1/2 tablets by mouth daily. 45 tablet 3  . Multiple Vitamin (MULTIVITAMIN WITH MINERALS) TABS tablet Take 1 tablet by mouth daily.    Marland Kitchen omeprazole (PRILOSEC) 20 MG capsule Take 1 capsule (20 mg total) by mouth 2 (two) times daily. 60 capsule 11  . sertraline (ZOLOFT) 25 MG tablet Take 1 tablet (25 mg total) by mouth daily. 30 tablet 11  . sertraline (ZOLOFT) 50 MG tablet Take 1 tablet (50 mg total) by  mouth daily. 30 tablet 11  . traZODone (DESYREL) 50 MG tablet Take 0.5-1 tablets (25-50 mg total) by mouth at bedtime as needed. for sleep 30 tablet 1   No facility-administered medications prior to visit.    Allergies  Allergen Reactions  . Nsaids     Other reaction(s): GI upset    ROS Review of Systems    Objective:    Physical Exam  LMP 10/30/2012  Wt Readings from Last 3 Encounters:  09/08/20 137 lb (62.1 kg)  08/24/20 137 lb 3.2 oz (62.2 kg)  10/19/19 152 lb (68.9 kg)     Health Maintenance Due  Topic Date Due  . Zoster Vaccines- Shingrix (1 of 2) Never done  . COVID-19 Vaccine (4 - Booster for Pfizer series) 07/07/2020  . INFLUENZA VACCINE  01/01/2021    There are no preventive care reminders to display for this patient.  Lab Results   Component Value Date   TSH 2.514 09/04/2020   Lab Results  Component Value Date   WBC 11.7 (H) 09/04/2020   HGB 12.9 09/04/2020   HCT 39.1 09/04/2020   MCV 88.1 09/04/2020   PLT 331 09/04/2020   Lab Results  Component Value Date   NA 140 03/14/2021   K 4.4 03/14/2021   CO2 19 (L) 03/14/2021   GLUCOSE 90 03/14/2021   BUN 16 03/14/2021   CREATININE 0.61 03/14/2021   BILITOT <0.2 03/14/2021   ALKPHOS 70 03/14/2021   AST 17 03/14/2021   ALT 11 03/14/2021   PROT 6.4 03/14/2021   ALBUMIN 4.4 03/14/2021   CALCIUM 9.6 03/14/2021   ANIONGAP 12 09/04/2020   EGFR 103 03/14/2021   GFR 113.59 10/19/2019   Lab Results  Component Value Date   CHOL 219 (H) 03/14/2021   Lab Results  Component Value Date   HDL 69 03/14/2021   Lab Results  Component Value Date   LDLCALC 133 (H) 03/14/2021   Lab Results  Component Value Date   TRIG 99 03/14/2021   Lab Results  Component Value Date   CHOLHDL 3.2 03/14/2021   Lab Results  Component Value Date   HGBA1C 5.7 (H) 09/04/2020      Assessment & Plan:   Problem List Items Addressed This Visit   None   No orders of the defined types were placed in this encounter.   Follow-up: No follow-ups on file.    Orma Render, NP

## 2021-03-19 NOTE — Assessment & Plan Note (Signed)
Well controlled today. No changes to plan of care

## 2021-03-19 NOTE — Assessment & Plan Note (Signed)
Exacerbation of anxiety without good control while on sertraline. Will taper off and retry prozac today to see if we can get her feeling better.  Sent 100mg  trazodone for mood and sleep. We will monitor closely with prozac dosage. PRN clonazepam provided for use only with panic symptoms on very limited basis.  No more than 15 per 30 day period.  Will re-assess after taper and restart of prozac Sent referral for therapy.

## 2021-03-19 NOTE — Progress Notes (Signed)
Established Patient Office Visit  Subjective:  Patient ID: Sue [name], female    DOB: 12-Apr-1961  Age: 60 y.o. MRN: [id number]  CC:  Chief Complaint  Patient presents with   Follow-up    Patient has a skin tag that has come back on the side of her face.  Patients want a [place] appointment and wants to increase Tramadol.  Want a note for massages.     HPI Sue [name] presents for F/U for HLD and anxiety.   Anxiety She feels like she cannot sit still even in the evening when she is home . She reports that she has had decreased appetite since last Christmas. She initially lost a lot of weight from not eating, but she reports she has put back on 10 lbs.  She has had increased anxiety and irritability lately. Reports recent meeting at work in which she was told that she made a mistake on the job in May and she was chastised for this, which has triggered worse symptoms.  She does not feel like sertraline is giving her good control.  She previously had good mood control with prozac.  She has been taking trazodone 100mg  to help her sleep and mood. She feels this has been helpful. She is having intermittent panic symptoms. She was previously on clonazepam for PRN use and feels this would be helpful again to have. After her experience at work recently she had several moments of panic/anxiety in which she felt it was needed.   Lipids and glucose better on repeat labs, which she is happy about She has not been able to schedule with [facility] for therapy    ROS Review of Systems  Constitutional:  Positive for appetite change.  Neurological:  Negative for dizziness, light-headedness and headaches.  Psychiatric/Behavioral:  Positive for agitation, decreased concentration, dysphoric mood and sleep disturbance. Negative for self-injury and suicidal ideas. The patient is nervous/anxious.      Objective:    Physical Exam Vitals and nursing note reviewed.  Constitutional:      Appearance:  Normal appearance.  HENT:     Head: Normocephalic.  Eyes:     Extraocular Movements: Extraocular movements intact.     Conjunctiva/sclera: Conjunctivae normal.     Pupils: Pupils are equal, round, and reactive to light.  Neck:     Vascular: No carotid bruit.  Cardiovascular:     Rate and Rhythm: Normal rate and regular rhythm.     Pulses: Normal pulses.     Heart sounds: Normal heart sounds.  Pulmonary:     Effort: Pulmonary effort is normal.     Breath sounds: Normal breath sounds.  Musculoskeletal:     Cervical back: Normal range of motion.     Right lower leg: No edema.     Left lower leg: No edema.  Skin:    General: Skin is warm and dry.     Capillary Refill: Capillary refill takes less than 2 seconds.  Neurological:     General: No focal deficit present.     Mental Status: She is alert and oriented to person, place, and time.  Psychiatric:        Attention and Perception: Attention normal.        Mood and Affect: Mood is anxious. Affect is tearful.        Speech: Speech is rapid and pressured.        Behavior: Behavior normal.        Thought Content: Thought  content normal.        Judgment: Judgment normal.    BP 116/73   Pulse (!) 52   Ht 5\' 2"  (1.575 m)   Wt 138 lb 3.2 oz (62.7 kg)   LMP 10/30/2012   SpO2 100%   BMI 25.28 kg/m  Wt Readings from Last 3 Encounters:  03/19/21 138 lb 3.2 oz (62.7 kg)  09/08/20 137 lb (62.1 kg)  08/24/20 137 lb 3.2 oz (62.2 kg)       Assessment & Plan:   Problem List Items Addressed This Visit     Hypertension    Well controlled today. No changes to plan of care       Anxiety    Exacerbation of anxiety without good control while on sertraline. Will taper off and retry prozac today to see if we can get her feeling better.  Sent 100mg  trazodone for mood and sleep. We will monitor closely with prozac dosage. PRN clonazepam provided for use only with panic symptoms on very limited basis.  No more than 15 per 30 day  period.  Will re-assess after taper and restart of prozac Sent referral for therapy.       Relevant Medications   traZODone (DESYREL) 100 MG tablet   FLUoxetine (PROZAC) 10 MG tablet   clonazePAM (KLONOPIN) 0.5 MG tablet   Other Relevant Orders   Ambulatory referral to Psychology   Insomnia    Trazodone 50mg  not effective. Patient has been taking 100mg  and this helps. Will write for 100mg  and monitor SSRI closely to avoid serotonin syndrome.       Relevant Medications   traZODone (DESYREL) 100 MG tablet   Other Relevant Orders   Ambulatory referral to Psychology   Hyperlipidemia - Primary    Improved with recent labs Review diet and activity recommendations       Prediabetes    Recent BG normal on labs.  Will plan to recheck A1c in 6 months for evaluation.       Other Visit Diagnoses     Depression, recurrent ([place])       Relevant Medications   traZODone (DESYREL) 100 MG tablet   FLUoxetine (PROZAC) 10 MG tablet   clonazePAM (KLONOPIN) 0.5 MG tablet   Other Relevant Orders   Ambulatory referral to Psychology        Meds ordered this encounter  Medications   traZODone (DESYREL) 100 MG tablet    Sig: Take 1 tablet (100 mg total) by mouth at bedtime as needed. for sleep    Dispense:  90 tablet    Refill:  3   FLUoxetine (PROZAC) 10 MG tablet    Sig: Take one tab (10mg ) by mouth for 7 days then increase to two tabs (20mg ) by mouth a day.    Dispense:  90 tablet    Refill:  3   clonazePAM (KLONOPIN) 0.5 MG tablet    Sig: Take 1 tablet (0.5 mg total) by mouth as needed for anxiety. No more than one time per day.    Dispense:  15 tablet    Refill:  0     Follow-up: Return in about 4 weeks (around 04/16/2021) for Telephone visit to see how you are doing on the medication.   Time: 45 minutes, >50% spent counseling, care coordination, chart review, and documentation.   Orma Render, NP

## 2021-03-20 ENCOUNTER — Other Ambulatory Visit (HOSPITAL_COMMUNITY): Payer: Self-pay

## 2021-04-10 ENCOUNTER — Ambulatory Visit: Payer: No Typology Code available for payment source | Admitting: Psychologist

## 2021-04-16 ENCOUNTER — Encounter (HOSPITAL_BASED_OUTPATIENT_CLINIC_OR_DEPARTMENT_OTHER): Payer: Self-pay | Admitting: Nurse Practitioner

## 2021-04-16 ENCOUNTER — Other Ambulatory Visit (HOSPITAL_COMMUNITY): Payer: Self-pay

## 2021-04-16 ENCOUNTER — Ambulatory Visit (INDEPENDENT_AMBULATORY_CARE_PROVIDER_SITE_OTHER): Payer: No Typology Code available for payment source | Admitting: Nurse Practitioner

## 2021-04-16 DIAGNOSIS — F419 Anxiety disorder, unspecified: Secondary | ICD-10-CM

## 2021-04-16 DIAGNOSIS — F339 Major depressive disorder, recurrent, unspecified: Secondary | ICD-10-CM

## 2021-04-16 MED ORDER — FLUOXETINE HCL 20 MG PO TABS
40.0000 mg | ORAL_TABLET | Freq: Every day | ORAL | 11 refills | Status: DC
Start: 2021-04-16 — End: 2021-05-15
  Filled 2021-04-16: qty 60, 30d supply, fill #0

## 2021-04-16 NOTE — Patient Instructions (Signed)
Recommendations from today's visit:  Increase Prozac to 40mg  per day- new prescription sent to pharmacy Will follow-up in about 2 weeks with MyChart message to see how you are doing on medication Try to get in touch with Dr. Michail Sermon to see if you can get back on his schedule for in person visit in the near future, I do feel this will be helpful.

## 2021-04-16 NOTE — Assessment & Plan Note (Signed)
Patient reported symptoms much improved with Prozac 20mg  per day.  Unable to get in to see therapist as of yet, but plans to call. I encourage patient to reschedule this to help with increased stressors and anxiety. I feel the combination of medication and counseling will be beneficial for her.  Will increase prozac to 40mg  today and plan to f/u with [facility] message in about 2 weeks to see how she is doing.  She will f/u with visit if she is not tolerating medication well or significant changes occur.

## 2021-04-16 NOTE — Assessment & Plan Note (Signed)
See anxiety

## 2021-04-16 NOTE — Progress Notes (Signed)
Virtual Visit Encounter telephone visit.   I connected with  Elmo E Guthrie on 04/16/21 at 1050 AM EST by secure audio and/or video enabled telemedicine application. I verified that I am speaking with the correct person using two identifiers.   I introduced myself as a Designer, jewellery with the practice. The limitations of evaluation and management by telemedicine discussed with the patient and the availability of in person appointments. The patient expressed verbal understanding and consent to proceed.  Participating parties in this visit include: Myself and patient  The patient is: Patient Location: Home/Car I am: Provider Location: Office/Clinic Subjective:    CC and HPI: Sue [name] is a 60 y.o. year old female presenting for follow up of anxiety.  She started taking the Prozac and feels like it is helping significantly so far. She reports the first few days she noticed a difference immediately. She does feel like this was partially due to combination of coming off of her old SSRI, but she can tell a difference with this medication now that she has been on it about 3 weeks.   She tells me she is still having issues with work. She reports that she feels her boss only provides her with "backhanded" compliments. When she gets compliments or does something right she will tell her that she did a great job, but then will tell her something negative in the same comment. She tells me this is defeating for her and she feels like she can't just get a "good job" without being told she has or is doing something wrong in the same comment.   She had an appointment scheduled with Dr. Michail Sermon for therapy, but she reports when she arrived she was informed the appt had to be rescheduled for unknown reasons. She was very upset about this and tells me she was really looking forward to this. She tells me that they did offer to schedule her for a virtual appointment but she would rather be seen in person  for the first visit. She plans to follow-up to reschedule and in person visit in the near future.   Past medical history, Surgical history, Family history not pertinant except as noted below, Social history, Allergies, and medications have been entered into the medical record, reviewed, and corrections made.   Review of Systems:  All review of systems negative except what is listed in the HPI  Objective:    Alert and oriented x 4 Speaking in clear sentences with no shortness of breath. No distress.  Impression and Recommendations:    Problem List Items Addressed This Visit     Anxiety - Primary    Patient reported symptoms much improved with Prozac 20mg  per day.  Unable to get in to see therapist as of yet, but plans to call. I encourage patient to reschedule this to help with increased stressors and anxiety. I feel the combination of medication and counseling will be beneficial for her.  Will increase prozac to 40mg  today and plan to f/u with [facility] message in about 2 weeks to see how she is doing.  She will f/u with visit if she is not tolerating medication well or significant changes occur.       Relevant Medications   FLUoxetine (PROZAC) 20 MG tablet   Depression, recurrent ([place])    See anxiety      Relevant Medications   FLUoxetine (PROZAC) 20 MG tablet    orders and follow up as documented in EMR I discussed the  assessment and treatment plan with the patient. The patient was provided an opportunity to ask questions and all were answered. The patient agreed with the plan and demonstrated an understanding of the instructions.   The patient was advised to call back or seek an in-person evaluation if the symptoms worsen or if the condition fails to improve as anticipated.  Follow-Up: [facility] message in 2 weeks   I provided 20 minutes of non-face-to-face interaction with this non face-to-face encounter including intake, same-day documentation, and chart review.   Orma Render, NP  , DNP, AGNP-c [place] at [facility] [phone] (fax)

## 2021-04-17 ENCOUNTER — Ambulatory Visit (INDEPENDENT_AMBULATORY_CARE_PROVIDER_SITE_OTHER): Payer: No Typology Code available for payment source | Admitting: Psychologist

## 2021-04-17 ENCOUNTER — Other Ambulatory Visit (HOSPITAL_COMMUNITY): Payer: Self-pay

## 2021-04-17 ENCOUNTER — Other Ambulatory Visit: Payer: Self-pay

## 2021-04-17 DIAGNOSIS — F411 Generalized anxiety disorder: Secondary | ICD-10-CM

## 2021-04-17 MED FILL — Levothyroxine Sodium Tab 25 MCG: ORAL | 90 days supply | Qty: 90 | Fill #2 | Status: AC

## 2021-04-20 ENCOUNTER — Other Ambulatory Visit (HOSPITAL_COMMUNITY): Payer: Self-pay

## 2021-05-15 ENCOUNTER — Other Ambulatory Visit (HOSPITAL_BASED_OUTPATIENT_CLINIC_OR_DEPARTMENT_OTHER): Payer: Self-pay | Admitting: Nurse Practitioner

## 2021-05-15 ENCOUNTER — Other Ambulatory Visit (HOSPITAL_COMMUNITY): Payer: Self-pay

## 2021-05-15 ENCOUNTER — Ambulatory Visit: Payer: No Typology Code available for payment source | Admitting: Psychologist

## 2021-05-15 DIAGNOSIS — F339 Major depressive disorder, recurrent, unspecified: Secondary | ICD-10-CM

## 2021-05-15 DIAGNOSIS — F419 Anxiety disorder, unspecified: Secondary | ICD-10-CM

## 2021-05-15 MED ORDER — FLUOXETINE HCL 60 MG PO TABS
1.0000 | ORAL_TABLET | Freq: Every day | ORAL | 3 refills | Status: DC
Start: 2021-05-15 — End: 2021-05-15
  Filled 2021-05-15: qty 90, 90d supply, fill #0

## 2021-05-15 MED ORDER — FLUOXETINE HCL 40 MG PO CAPS
40.0000 mg | ORAL_CAPSULE | Freq: Every day | ORAL | 3 refills | Status: DC
  Filled 2021-05-15: qty 90, 90d supply, fill #0
  Filled 2021-08-14: qty 90, 90d supply, fill #1
  Filled 2021-11-08: qty 90, 90d supply, fill #2
  Filled 2022-02-07: qty 90, 90d supply, fill #3

## 2021-05-15 MED ORDER — FLUOXETINE HCL 20 MG PO CAPS
20.0000 mg | ORAL_CAPSULE | Freq: Every day | ORAL | 3 refills | Status: DC
  Filled 2021-05-15: qty 90, 90d supply, fill #0
  Filled 2021-08-14: qty 90, 90d supply, fill #1
  Filled 2021-11-08: qty 90, 90d supply, fill #2
  Filled 2022-02-07: qty 90, 90d supply, fill #3

## 2021-05-22 ENCOUNTER — Other Ambulatory Visit (HOSPITAL_COMMUNITY): Payer: Self-pay

## 2021-06-06 ENCOUNTER — Other Ambulatory Visit (HOSPITAL_COMMUNITY): Payer: Self-pay

## 2021-06-20 ENCOUNTER — Other Ambulatory Visit (HOSPITAL_COMMUNITY): Payer: Self-pay

## 2021-07-12 ENCOUNTER — Encounter (HOSPITAL_BASED_OUTPATIENT_CLINIC_OR_DEPARTMENT_OTHER): Payer: Self-pay | Admitting: Nurse Practitioner

## 2021-07-17 ENCOUNTER — Other Ambulatory Visit: Payer: Self-pay

## 2021-07-17 ENCOUNTER — Ambulatory Visit (INDEPENDENT_AMBULATORY_CARE_PROVIDER_SITE_OTHER): Payer: No Typology Code available for payment source | Admitting: Nurse Practitioner

## 2021-07-17 ENCOUNTER — Encounter (HOSPITAL_BASED_OUTPATIENT_CLINIC_OR_DEPARTMENT_OTHER): Payer: Self-pay | Admitting: Nurse Practitioner

## 2021-07-17 ENCOUNTER — Other Ambulatory Visit (HOSPITAL_BASED_OUTPATIENT_CLINIC_OR_DEPARTMENT_OTHER): Payer: Self-pay | Admitting: Nurse Practitioner

## 2021-07-17 ENCOUNTER — Encounter (HOSPITAL_BASED_OUTPATIENT_CLINIC_OR_DEPARTMENT_OTHER): Payer: Self-pay

## 2021-07-17 ENCOUNTER — Ambulatory Visit
Admission: RE | Admit: 2021-07-17 | Discharge: 2021-07-17 | Disposition: A | Discharge status: Home / Self Care | Payer: No Typology Code available for payment source | Source: Ambulatory Visit | Attending: Nurse Practitioner | Admitting: Nurse Practitioner

## 2021-07-17 ENCOUNTER — Other Ambulatory Visit (HOSPITAL_COMMUNITY): Payer: Self-pay

## 2021-07-17 VITALS — BP 122/88 | HR 62 | Ht 62.0 in | Wt 129.0 lb

## 2021-07-17 DIAGNOSIS — W19XXXA Unspecified fall, initial encounter: Secondary | ICD-10-CM

## 2021-07-17 DIAGNOSIS — R0781 Pleurodynia: Secondary | ICD-10-CM

## 2021-07-17 DIAGNOSIS — R0789 Other chest pain: Secondary | ICD-10-CM

## 2021-07-17 DIAGNOSIS — E039 Hypothyroidism, unspecified: Secondary | ICD-10-CM

## 2021-07-17 MED ORDER — CYCLOBENZAPRINE HCL 10 MG PO TABS
10.0000 mg | ORAL_TABLET | Freq: Every day | ORAL | 0 refills | Status: DC
  Filled 2021-07-17: qty 30, 30d supply, fill #0

## 2021-07-17 MED ORDER — LEVOTHYROXINE SODIUM 25 MCG PO TABS
25.0000 ug | ORAL_TABLET | Freq: Every day | ORAL | 2 refills | Status: DC
  Filled 2021-07-17: qty 90, 90d supply, fill #0
  Filled 2021-10-11: qty 90, 90d supply, fill #1
  Filled 2022-01-09: qty 90, 90d supply, fill #2

## 2021-07-17 NOTE — Patient Instructions (Addendum)
I want to get an x-ray of the ribs to make sure there is not a fracture in there. If there is, it will not likely change much of the plan, but will let us know to watch closely and about how long the recovery should take.   I have sent the order to [place] [facility]. You can go in to have this done at your convenience.

## 2021-07-18 ENCOUNTER — Ambulatory Visit (INDEPENDENT_AMBULATORY_CARE_PROVIDER_SITE_OTHER): Payer: No Typology Code available for payment source | Admitting: Clinical

## 2021-07-18 DIAGNOSIS — Z566 Other physical and mental strain related to work: Secondary | ICD-10-CM

## 2021-07-18 DIAGNOSIS — F4322 Adjustment disorder with anxiety: Secondary | ICD-10-CM

## 2021-07-18 NOTE — Plan of Care (Signed)
Pt will develop healthy coping strategies to manage anxiety sxs as evidenced by practicing deep breathing exercises 5/7 days week and engaging in physical activity daily for 30-45 minutes. Pt participated in completion of treatment plan.
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
## 2021-07-19 ENCOUNTER — Encounter (HOSPITAL_BASED_OUTPATIENT_CLINIC_OR_DEPARTMENT_OTHER): Payer: Self-pay | Admitting: Nurse Practitioner

## 2021-07-19 ENCOUNTER — Ambulatory Visit (HOSPITAL_COMMUNITY): Payer: No Typology Code available for payment source | Admitting: Clinical

## 2021-07-19 DIAGNOSIS — W19XXXA Unspecified fall, initial encounter: Secondary | ICD-10-CM

## 2021-07-19 HISTORY — DX: Unspecified fall, initial encounter: W19.XXXA

## 2021-07-19 NOTE — Progress Notes (Addendum)
Acute Office Visit  Subjective:    Patient ID: Sue [name], female    DOB: [birth year], 61 y.o.   MRN: [id number]  Chief Complaint  Patient presents with   Follow-up    Patient presents today she took a hard fall on Friday,her left side is hurting under her breast. She is concerned about cracked ribs. Patient states it hurts when she takes a deep breath.     HPI Patient is in today for evaluation after fall last Friday.  Sue [name] tells me that she was taking her trash out down a ramp with a slight elevated edging on it when the trash can became caught on the edging and she lost her balance and fell face forward onto the ground.  She reports that her left shoulder and left breast hit the ground quite hard.  She tells me that she did not lose consciousness or have any head injury however she is still having pain in the left rib cage underneath the left breast.  She reports that the pain is most noticeable with a deep breath.  She denies any deformity to the area or difficulty breathing.  No shortness of breath.  There is mild ecchymosis present however no significant edema or signs of bleeding present.  Past Medical History:  Diagnosis Date   Abdominal bloating 08/24/2020   Abnormal weight loss 08/24/2020   Anxiety    Arthritis    Hands   Change in bowel habit 08/24/2020   Encounter for annual physical exam 08/24/2020   Encounter for screening mammogram for malignant neoplasm of breast 08/24/2020   Epigastric pain 08/24/2020   GERD (gastroesophageal reflux disease)    Hypertension    Hypokalemia 09/08/2020   Hypothyroid    Pigmented skin lesions 08/24/2020   Rectal bleeding 08/24/2020    Past Surgical History:  Procedure Laterality Date   INCISION AND DRAINAGE Right 09/03/2019   Procedure: INCISION AND DRAINAGE DOG BITE WOUND RIGHT HAND;  Surgeon: Daryll Brod, MD;  Location: [place];  Service: Orthopedics;  Laterality: Right;  IV REGIONAL FOREARM BLOCK   TONSILLECTOMY  3rd grade   UPPER  ESOPHAGEAL ENDOSCOPIC ULTRASOUND (EUS) N/A 02/25/2017   Procedure: UPPER ESOPHAGEAL ENDOSCOPIC ULTRASOUND (EUS);  Surgeon: Carol Ada, MD;  Location: [facility];  Service: Endoscopy;  Laterality: N/A;    Family History  Problem Relation Age of Onset   Uterine cancer Maternal Grandmother    Colon cancer Other        Paternal Great Grandmother   Breast cancer Other        Maternal Great Grandmother   Hypertension Father    Heart attack Father    Hypertension Brother    Lupus Mother    Osteoporosis Mother     Social History   Socioeconomic History   Marital status: Single    Spouse name: Not on file   Number of children: Not on file   Years of education: Not on file   Highest education level: Not on file  Occupational History   Not on file  Tobacco Use   Smoking status: Never   Smokeless tobacco: Never  Vaping Use   Vaping Use: Never used  Substance and Sexual Activity   Alcohol use: Not Currently    Comment: ocassional   Drug use: No   Sexual activity: Never    Partners: Male  Other Topics Concern   Not on file  Social History Narrative   Not on file   Social Determinants of  Health   Financial Resource Strain: Not on file  Food Insecurity: Not on file  Transportation Needs: Not on file  Physical Activity: Not on file  Stress: Not on file  Social Connections: Not on file  Intimate Partner Violence: Not on file    Outpatient Medications Prior to Visit  Medication Sig Dispense Refill   clonazePAM (KLONOPIN) 0.5 MG tablet Take 1 tablet (0.5 mg total) by mouth as needed for anxiety. No more than one time per day. 15 tablet 0   Dexlansoprazole (DEXILANT PO) Dexilant     FLUoxetine (PROZAC) 20 MG capsule Take 1 capsule (20 mg total) by mouth daily. Take with 50m dose to make 665mdaily. 90 capsule 3   FLUoxetine (PROZAC) 40 MG capsule Take 1 capsule (40 mg total) by mouth daily. Take with 2076mo make 40m13mse daily. 90 capsule 3    losartan-hydrochlorothiazide (HYZAAR) 50-12.5 MG tablet Take 1/2 tablets by mouth daily. 45 tablet 3   Multiple Vitamin (MULTIVITAMIN WITH MINERALS) TABS tablet Take 1 tablet by mouth daily.     omeprazole (PRILOSEC) 20 MG capsule Take 1 capsule (20 mg total) by mouth 2 (two) times daily. 60 capsule 11   traZODone (DESYREL) 100 MG tablet Take 1 tablet (100 mg total) by mouth at bedtime as needed. for sleep 90 tablet 3   levothyroxine (SYNTHROID) 25 MCG tablet Take 1 tablet (25 mcg total) by mouth daily before breakfast. 90 tablet 2   No facility-administered medications prior to visit.    Allergies  Allergen Reactions   Nsaids Other (See Comments)    Other reaction(s): GI upset    Review of Systems All review of systems negative except what is listed in the HPI     Objective:    Physical Exam Vitals and nursing note reviewed.  Constitutional:      Appearance: Normal appearance.  Eyes:     Extraocular Movements: Extraocular movements intact.     Conjunctiva/sclera: Conjunctivae normal.     Pupils: Pupils are equal, round, and reactive to light.  Cardiovascular:     Rate and Rhythm: Normal rate and regular rhythm.     Pulses: Normal pulses.     Heart sounds: Normal heart sounds.  Pulmonary:     Effort: Pulmonary effort is normal.     Breath sounds: Normal breath sounds. No wheezing or rhonchi.  Chest:     Chest wall: Tenderness present.  Abdominal:     General: Bowel sounds are normal.     Palpations: Abdomen is soft.  Musculoskeletal:        General: Swelling and tenderness present. Normal range of motion.       Arms:       Legs:     Comments: Tenderness and ecchymosis noted to the left anterior thoracic region approximate fifth intercostal space.  No deformity noticed on palpation.  Ecchymosis also noted to the left anterior knee.  No crepitus or edema present.  Skin:    General: Skin is warm.     Capillary Refill: Capillary refill takes less than 2 seconds.      Findings: Bruising present.  Neurological:     General: No focal deficit present.     Mental Status: She is alert and oriented to person, place, and time.     Cranial Nerves: No cranial nerve deficit.     Sensory: No sensory deficit.     Motor: No weakness.     Gait: Gait normal.  Psychiatric:  Mood and Affect: Mood normal.        Behavior: Behavior normal.        Thought Content: Thought content normal.        Judgment: Judgment normal.    BP 122/88    Pulse 62    Ht _0  (1.575 m)    Wt 129 lb (58.5 kg)    LMP 10/30/2012    SpO2 97%    BMI 23.59 kg/m  Wt Readings from Last 3 Encounters:  07/17/21 129 lb (58.5 kg)  03/19/21 138 lb 3.2 oz (62.7 kg)  09/08/20 137 lb (62.1 kg)    Health Maintenance Due  Topic Date Due   Zoster Vaccines- Shingrix (1 of 2) Never done   COVID-19 Vaccine (4 - Booster for Pfizer series) 06/09/2020    There are no preventive care reminders to display for this patient.   Lab Results  Component Value Date   TSH 2.514 09/04/2020   Lab Results  Component Value Date   WBC 11.7 (H) 09/04/2020   HGB 12.9 09/04/2020   HCT 39.1 09/04/2020   MCV 88.1 09/04/2020   PLT 331 09/04/2020   Lab Results  Component Value Date   NA 140 03/14/2021   K 4.4 03/14/2021   CO2 19 (L) 03/14/2021   GLUCOSE 90 03/14/2021   BUN 16 03/14/2021   CREATININE 0.61 03/14/2021   BILITOT <0.2 03/14/2021   ALKPHOS 70 03/14/2021   AST 17 03/14/2021   ALT 11 03/14/2021   PROT 6.4 03/14/2021   ALBUMIN 4.4 03/14/2021   CALCIUM 9.6 03/14/2021   ANIONGAP 12 09/04/2020   EGFR 103 03/14/2021   GFR 113.59 10/19/2019   Lab Results  Component Value Date   CHOL 219 (H) 03/14/2021   Lab Results  Component Value Date   HDL 69 03/14/2021   Lab Results  Component Value Date   LDLCALC 133 (H) 03/14/2021   Lab Results  Component Value Date   TRIG 99 03/14/2021   Lab Results  Component Value Date   CHOLHDL 3.2 03/14/2021   Lab Results  Component Value Date    HGBA1C 5.7 (H) 09/04/2020       Assessment & Plan:   Problem List Items Addressed This Visit     Rib pain on left side - Primary   Fall    Recent fall with residual anterior thoracic pain along the approximate fifth intercostal space. Discussed with patient that imaging is recommended to ensure no acute fractures are present specifically displaced fractures.  She is agreeable to this.  We will send for x-ray evaluation.  Recommend gentle stretching and deep breathing exercises as well as splinting with movement.  We will send Flexeril for musculoskeletal pain that is also present. We will make changes to plan of care based on findings of imaging.  Patient aware if symptoms worsen to seek medical assistance.      Relevant Medications   cyclobenzaprine (FLEXERIL) 10 MG tablet     Meds ordered this encounter  Medications   cyclobenzaprine (FLEXERIL) 10 MG tablet    Sig: Take 1 tablet (10 mg total) by mouth at bedtime.    Dispense:  30 tablet    Refill:  0     Orma Render, NP

## 2021-07-19 NOTE — Telephone Encounter (Signed)
This encounter was created in error - please disregard.

## 2021-07-19 NOTE — Assessment & Plan Note (Signed)
Recent fall with residual anterior thoracic pain along the approximate fifth intercostal space. Discussed with patient that imaging is recommended to ensure no acute fractures are present specifically displaced fractures.  She is agreeable to this.  We will send for x-ray evaluation.  Recommend gentle stretching and deep breathing exercises as well as splinting with movement.  We will send Flexeril for musculoskeletal pain that is also present. We will make changes to plan of care based on findings of imaging.  Patient aware if symptoms worsen to seek medical assistance.

## 2021-07-20 DIAGNOSIS — R0781 Pleurodynia: Secondary | ICD-10-CM

## 2021-07-20 HISTORY — DX: Pleurodynia: R07.81

## 2021-07-26 ENCOUNTER — Other Ambulatory Visit: Payer: Self-pay

## 2021-07-26 ENCOUNTER — Other Ambulatory Visit (HOSPITAL_COMMUNITY): Payer: Self-pay

## 2021-07-26 ENCOUNTER — Ambulatory Visit (INDEPENDENT_AMBULATORY_CARE_PROVIDER_SITE_OTHER): Payer: No Typology Code available for payment source | Admitting: Clinical

## 2021-07-26 DIAGNOSIS — Z566 Other physical and mental strain related to work: Secondary | ICD-10-CM

## 2021-07-26 DIAGNOSIS — F4322 Adjustment disorder with anxiety: Secondary | ICD-10-CM

## 2021-07-26 NOTE — Progress Notes (Signed)
° °  THERAPIST PROGRESS NOTE  Session Time: 4pm  Participation Level: Active  Behavioral Response: Casual and NeatAlertpleasant  Type of Therapy: Individual Therapy  Treatment Goals addressed: develop healthy coping strategies to manage anxiety sxs as evidenced by practicing deep breathing exercises 5/7 days week and engaging in physical activity daily for 30-45 minutes.  ProgressTowards Goals: Initial  Interventions: CBT Virtual Visit via Video Note  I connected with Marlean Yokum on 07/26/21 at  4:00 PM EST by a video enabled telemedicine application and verified that I am speaking with the correct person using two identifiers.  Location: Patient: home Provider: office   I discussed the limitations of evaluation and management by telemedicine and the availability of in person appointments. The patient expressed understanding and agreed to proceed.   I discussed the assessment and treatment plan with the patient. The patient was provided an opportunity to ask questions and all were answered. The patient agreed with the plan and demonstrated an understanding of the instructions.   The patient was advised to call back or seek an in-person evaluation if the symptoms worsen or if the condition fails to improve as anticipated.  I provided 50 minutes of non-face-to-face time during this encounter.  Summary: Pt says work has been manageable this week due to having less interaction with management. Pt states she is fearful of making a mistake at work for fear of termination. 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 Pt says she feels guilty when she tells him no and acknowledges having people pleasing tendencies. Pt also says she has a tendency to overextend herself by doing task she does not want to do.  Suicidal/Homicidal: Pt denies SI/HI no plan, intent or attempt to harm self or others reported.  Therapist Response: prompted pt to identify consequences she has  received for being a people pleaser. Processed with pt importance of having healthy boundaries in relationships and discussed ways to establish boundaries. Probed for feedback on consequences of not having boundaries.  Plan: Return again in 3 weeks.  Diagnosis: Adjustment disorder with anxious mood   Work related stress  Collaboration of Care: Other none requested  Patient/Guardian was advised Release of Information must be obtained prior to any record release in order to collaborate their care with an outside provider. Patient/Guardian was advised if they have not already done so to contact the registration department to sign all necessary forms in order for [place] to release information regarding their care.   Consent: Patient/Guardian gives verbal consent for treatment and assignment of benefits for services provided during this visit. Patient/Guardian expressed understanding and agreed to proceed.   Yvette Rack, LCSW 07/26/2021

## 2021-08-07 ENCOUNTER — Other Ambulatory Visit (HOSPITAL_COMMUNITY): Payer: Self-pay

## 2021-08-14 ENCOUNTER — Other Ambulatory Visit (HOSPITAL_COMMUNITY): Payer: Self-pay

## 2021-08-23 ENCOUNTER — Other Ambulatory Visit: Payer: Self-pay

## 2021-08-23 ENCOUNTER — Ambulatory Visit (HOSPITAL_COMMUNITY): Payer: No Typology Code available for payment source | Admitting: Clinical

## 2021-08-23 NOTE — Progress Notes (Incomplete)
? ?  THERAPIST PROGRESS NOTE ? ?Session Time: 11am ? ?Participation Level: Active ? ?Behavioral Response: NeatAlertEuthymic ? ?Type of Therapy: Individual Therapy ? ?Treatment Goals addressed:  ? ?ProgressTowards Goals: Progressing ? ?Interventions: CBT ? ?Summary:  ?Suicidal/Homicidal:  ? ?Therapist Response:  ? ?Plan: Return again in 2 weeks. ? ?Diagnosis: adjustment disorder with anxious mood ? ?Collaboration of Care: Other none requested ? ?Patient/Guardian was advised Release of Information must be obtained prior to any record release in order to collaborate their care with an outside provider. Patient/Guardian was advised if they have not already done so to contact the registration department to sign all necessary forms in order for [place] to release information regarding their care.  ? ?Consent: Patient/Guardian gives verbal consent for treatment and assignment of benefits for services provided during this visit. Patient/Guardian expressed understanding and agreed to proceed.  ? ?Jerauld Bostwick Lynelle Smoke, LCSW ?08/23/2021 ? ?

## 2021-08-24 ENCOUNTER — Other Ambulatory Visit (HOSPITAL_COMMUNITY): Payer: Self-pay

## 2021-09-03 ENCOUNTER — Other Ambulatory Visit (HOSPITAL_BASED_OUTPATIENT_CLINIC_OR_DEPARTMENT_OTHER): Payer: Self-pay | Admitting: Nurse Practitioner

## 2021-09-03 ENCOUNTER — Other Ambulatory Visit (HOSPITAL_COMMUNITY): Payer: Self-pay

## 2021-09-03 DIAGNOSIS — I10 Essential (primary) hypertension: Secondary | ICD-10-CM

## 2021-09-03 MED ORDER — LOSARTAN POTASSIUM-HCTZ 50-12.5 MG PO TABS
0.5000 | ORAL_TABLET | Freq: Every day | ORAL | 3 refills | Status: DC
  Filled 2021-09-03: qty 45, 90d supply, fill #0
  Filled 2021-12-03: qty 45, 90d supply, fill #1
  Filled 2022-03-05: qty 45, 90d supply, fill #2
  Filled 2022-07-29: qty 45, 90d supply, fill #3

## 2021-09-11 ENCOUNTER — Ambulatory Visit (INDEPENDENT_AMBULATORY_CARE_PROVIDER_SITE_OTHER): Payer: No Typology Code available for payment source | Admitting: Clinical

## 2021-09-11 DIAGNOSIS — Z566 Other physical and mental strain related to work: Secondary | ICD-10-CM

## 2021-09-11 DIAGNOSIS — F4322 Adjustment disorder with anxiety: Secondary | ICD-10-CM | POA: Diagnosis not present

## 2021-09-11 NOTE — Progress Notes (Signed)
? ?  THERAPIST PROGRESS NOTE ? ?Session Time: 11am ? ?Participation Level: Active ? ?Behavioral Response: Casual and NeatAlertpleasant ? ?Type of Therapy: Individual Therapy ? ?Treatment Goals addressed: healthy coping ? ?ProgressTowards Goals: Progressing ? ?Interventions: Supportive ?Virtual Visit via Video Note ? ?I connected with Sue [name] on 09/11/21 at 11:00 AM EDT by a video enabled telemedicine application and verified that I am speaking with the correct person using two identifiers. ? ?Location: ?Patient: home ?Provider: office ?  ?I discussed the limitations of evaluation and management by telemedicine and the availability of in person appointments. The patient expressed understanding and agreed to proceed. ?  ?I discussed the assessment and treatment plan with the patient. The patient was provided an opportunity to ask questions and all were answered. The patient agreed with the plan and demonstrated an understanding of the instructions. ?  ?The patient was advised to call back or seek an in-person evaluation if the symptoms worsen or if the condition fails to improve as anticipated. ? ?I provided 40 minutes of non-face-to-face time during this encounter.  ?Summary: Probation officer informed pt last day of employment on 4/21; discussed referral process and addressed questions. Actively listened and supported pt as she discussed work related stressors. Assisted her in identifying strategies to cope with changes that are occurring at her place of employment. ?Suicidal/Homicidal: Pt denies SI/HI no plan intent or attempt to harm self or others reported. ? ?Diagnosis: adjustment disorder with anxious mood ?Work related stress ? ?Collaboration of Care: Other none requested ? ?Patient/Guardian was advised Release of Information must be obtained prior to any record release in order to collaborate their care with an outside provider. Patient/Guardian was advised if they have not already done so to contact the  registration department to sign all necessary forms in order for [place] to release information regarding their care.  ? ?Consent: Patient/Guardian gives verbal consent for treatment and assignment of benefits for services provided during this visit. Patient/Guardian expressed understanding and agreed to proceed.  ? ?Sue Tamargo Lynelle Smoke, LCSW ?09/11/2021 ? ?

## 2021-09-18 ENCOUNTER — Other Ambulatory Visit (HOSPITAL_COMMUNITY): Payer: Self-pay

## 2021-09-25 ENCOUNTER — Other Ambulatory Visit (HOSPITAL_BASED_OUTPATIENT_CLINIC_OR_DEPARTMENT_OTHER): Payer: Self-pay | Admitting: Nurse Practitioner

## 2021-09-25 ENCOUNTER — Other Ambulatory Visit (HOSPITAL_COMMUNITY): Payer: Self-pay

## 2021-09-25 DIAGNOSIS — F419 Anxiety disorder, unspecified: Secondary | ICD-10-CM

## 2021-09-25 DIAGNOSIS — F339 Major depressive disorder, recurrent, unspecified: Secondary | ICD-10-CM

## 2021-09-26 MED ORDER — CLONAZEPAM 0.5 MG PO TABS
0.5000 mg | ORAL_TABLET | ORAL | 0 refills | Status: DC | PRN
  Filled 2021-09-26: qty 15, 15d supply, fill #0

## 2021-09-27 ENCOUNTER — Other Ambulatory Visit (HOSPITAL_COMMUNITY): Payer: Self-pay

## 2021-10-11 ENCOUNTER — Other Ambulatory Visit (HOSPITAL_COMMUNITY): Payer: Self-pay

## 2021-10-24 ENCOUNTER — Other Ambulatory Visit (HOSPITAL_BASED_OUTPATIENT_CLINIC_OR_DEPARTMENT_OTHER): Payer: Self-pay | Admitting: Nurse Practitioner

## 2021-10-24 ENCOUNTER — Other Ambulatory Visit (HOSPITAL_COMMUNITY): Payer: Self-pay

## 2021-10-24 DIAGNOSIS — K219 Gastro-esophageal reflux disease without esophagitis: Secondary | ICD-10-CM

## 2021-10-24 MED ORDER — OMEPRAZOLE 20 MG PO CPDR
20.0000 mg | DELAYED_RELEASE_CAPSULE | Freq: Two times a day (BID) | ORAL | 11 refills | Status: DC
  Filled 2021-10-24: qty 60, 30d supply, fill #0
  Filled 2021-11-26: qty 60, 30d supply, fill #1
  Filled 2021-12-24: qty 60, 30d supply, fill #2
  Filled 2022-01-21: qty 60, 30d supply, fill #3
  Filled 2022-02-20: qty 60, 30d supply, fill #4
  Filled 2022-03-20: qty 60, 30d supply, fill #5
  Filled 2022-04-30: qty 60, 30d supply, fill #6
  Filled 2022-05-29: qty 60, 30d supply, fill #7
  Filled 2022-07-01: qty 60, 30d supply, fill #8
  Filled 2022-07-29: qty 60, 30d supply, fill #9
  Filled 2022-09-01: qty 60, 30d supply, fill #10

## 2021-11-08 ENCOUNTER — Other Ambulatory Visit (HOSPITAL_COMMUNITY): Payer: Self-pay

## 2021-11-26 ENCOUNTER — Other Ambulatory Visit (HOSPITAL_COMMUNITY): Payer: Self-pay

## 2021-12-03 ENCOUNTER — Other Ambulatory Visit (HOSPITAL_COMMUNITY): Payer: Self-pay

## 2021-12-24 ENCOUNTER — Other Ambulatory Visit (HOSPITAL_COMMUNITY): Payer: Self-pay

## 2022-01-09 ENCOUNTER — Other Ambulatory Visit (HOSPITAL_COMMUNITY): Payer: Self-pay

## 2022-01-10 ENCOUNTER — Telehealth (HOSPITAL_BASED_OUTPATIENT_CLINIC_OR_DEPARTMENT_OTHER): Payer: Self-pay

## 2022-01-10 NOTE — Telephone Encounter (Signed)
Received fax from [facility] requesting manufacturer change on medication Levothyroxine. This was okayed per Amelia, it was faxed back to [phone].  [employer]

## 2022-01-21 ENCOUNTER — Other Ambulatory Visit (HOSPITAL_COMMUNITY): Payer: Self-pay

## 2022-02-07 ENCOUNTER — Other Ambulatory Visit (HOSPITAL_BASED_OUTPATIENT_CLINIC_OR_DEPARTMENT_OTHER): Payer: Self-pay | Admitting: Nurse Practitioner

## 2022-02-07 ENCOUNTER — Other Ambulatory Visit (HOSPITAL_COMMUNITY): Payer: Self-pay

## 2022-02-07 DIAGNOSIS — F419 Anxiety disorder, unspecified: Secondary | ICD-10-CM

## 2022-02-07 DIAGNOSIS — F339 Major depressive disorder, recurrent, unspecified: Secondary | ICD-10-CM

## 2022-02-08 MED ORDER — CLONAZEPAM 0.5 MG PO TABS
0.5000 mg | ORAL_TABLET | ORAL | 0 refills | Status: DC | PRN
  Filled 2022-02-08: qty 15, 15d supply, fill #0

## 2022-02-11 ENCOUNTER — Other Ambulatory Visit (HOSPITAL_COMMUNITY): Payer: Self-pay

## 2022-02-20 ENCOUNTER — Other Ambulatory Visit (HOSPITAL_COMMUNITY): Payer: Self-pay

## 2022-03-05 ENCOUNTER — Other Ambulatory Visit (HOSPITAL_COMMUNITY): Payer: Self-pay

## 2022-03-07 ENCOUNTER — Ambulatory Visit (HOSPITAL_BASED_OUTPATIENT_CLINIC_OR_DEPARTMENT_OTHER): Payer: No Typology Code available for payment source

## 2022-03-08 LAB — COMPREHENSIVE METABOLIC PANEL
ALT: 10 IU/L (ref 0–32)
AST: 19 IU/L (ref 0–40)
Albumin/Globulin Ratio: 2.2 (ref 1.2–2.2)
Albumin: 4.4 g/dL (ref 3.8–4.9)
Alkaline Phosphatase: 59 IU/L (ref 44–121)
BUN/Creatinine Ratio: 14 (ref 12–28)
BUN: 10 mg/dL (ref 8–27)
Bilirubin Total: 0.3 mg/dL (ref 0.0–1.2)
CO2: 23 mmol/L (ref 20–29)
Calcium: 9.6 mg/dL (ref 8.7–10.3)
Chloride: 102 mmol/L (ref 96–106)
Creatinine, Ser: 0.7 mg/dL (ref 0.57–1.00)
Globulin, Total: 2 g/dL (ref 1.5–4.5)
Glucose: 87 mg/dL (ref 70–99)
Potassium: 4.7 mmol/L (ref 3.5–5.2)
Sodium: 141 mmol/L (ref 134–144)
Total Protein: 6.4 g/dL (ref 6.0–8.5)
eGFR: 99 mL/min/{1.73_m2} (ref >59)

## 2022-03-08 LAB — HEMOGLOBIN A1C
Est. average glucose Bld gHb Est-mCnc: 111 mg/dL
Hgb A1c MFr Bld: 5.5 % (ref 4.8–5.6)

## 2022-03-08 LAB — THYROID PANEL WITH TSH
Free Thyroxine Index: 1.8 (ref 1.2–4.9)
T3 Uptake Ratio: 24 % (ref 24–39)
T4, Total: 7.5 ug/dL (ref 4.5–12.0)
TSH: 1 u[IU]/mL (ref 0.450–4.500)

## 2022-03-14 ENCOUNTER — Encounter (HOSPITAL_BASED_OUTPATIENT_CLINIC_OR_DEPARTMENT_OTHER): Payer: Self-pay | Admitting: Nurse Practitioner

## 2022-03-14 ENCOUNTER — Ambulatory Visit (INDEPENDENT_AMBULATORY_CARE_PROVIDER_SITE_OTHER): Payer: No Typology Code available for payment source | Admitting: Nurse Practitioner

## 2022-03-14 VITALS — BP 122/81 | HR 52 | Ht 62.0 in | Wt 121.0 lb

## 2022-03-14 DIAGNOSIS — F339 Major depressive disorder, recurrent, unspecified: Secondary | ICD-10-CM | POA: Diagnosis not present

## 2022-03-14 DIAGNOSIS — E782 Mixed hyperlipidemia: Secondary | ICD-10-CM

## 2022-03-14 DIAGNOSIS — I10 Essential (primary) hypertension: Secondary | ICD-10-CM | POA: Diagnosis not present

## 2022-03-14 DIAGNOSIS — E039 Hypothyroidism, unspecified: Secondary | ICD-10-CM

## 2022-03-14 DIAGNOSIS — Z0000 Encounter for general adult medical examination without abnormal findings: Secondary | ICD-10-CM | POA: Diagnosis not present

## 2022-03-14 DIAGNOSIS — R7303 Prediabetes: Secondary | ICD-10-CM

## 2022-03-14 NOTE — Patient Instructions (Addendum)
If you want to get the shingles vaccine, you can go to any of the pharmacy's to have this done.   Your labs look fantastic! Keep up the great work!!

## 2022-03-14 NOTE — Progress Notes (Signed)
BP 122/81   Pulse (!) 52   Ht '5\' 2"'  (1.575 m)   Wt 121 lb (54.9 kg)   LMP 06/03/2012   SpO2 100%   BMI 22.13 kg/m    Subjective:    Patient ID: Sue [name], female    DOB: Apr 26, 1961, 61 y.o.   MRN: [id number]  HPI: Sue [name] is a 61 y.o. female presenting on 03/14/2022 for comprehensive medical examination.   Current medical concerns include:none  She reports regular vision exams q1-5y: no She reports regular dental exams q 83m yes Her diet consists of:  not hungry much- eats what she wants when she wants She endorses exercise and/or activity of:  nothing routine She works in:  aPress photographer She is not having menstrual periods.  She  denies abnormal bleeding. She  denies menopausal symptoms.  She denies concerns today about STI: testing ordered: no  She denies concerns about skin changes today  She denies concerns about bowel changes today  She denies concerns about bladder changes today   Most Recent Depression Screen:     07/18/2021    1:30 PM 03/19/2021   10:22 AM 09/08/2020   11:11 AM 08/24/2020   12:38 PM 10/19/2019    8:56 AM  Depression screen PHQ 2/9  Decreased Interest  '3 1 3 ' 0  Down, Depressed, Hopeless  '3 2 3 ' 0  PHQ - 2 Score  '6 3 6 ' 0  Altered sleeping  '2 3 3   ' Tired, decreased energy  '3 2 3   ' Change in appetite  '2 2 3   ' Feeling bad or failure about yourself   '3 3 3   ' Trouble concentrating  '2 3 3   ' Moving slowly or fidgety/restless  '3 3 3   ' Suicidal thoughts  0 0 0   PHQ-9 Score  '21 19 24   ' Difficult doing work/chores  Somewhat difficult  Extremely dIfficult      Information is confidential and restricted. Go to Review Flowsheets to unlock data.   Most Recent Anxiety Screen:     03/19/2021   10:23 AM 09/08/2020   11:12 AM 08/24/2020   12:39 PM 10/19/2019    8:55 AM  GAD 7 : Generalized Anxiety Score  Nervous, Anxious, on Edge '3 2 3 ' 0  Control/stop worrying '3 3 3 ' 0  Worry too much - different things '3 3 3 ' 0  Trouble relaxing '3 3 3 ' 0   Restless '3 3 3 ' 0  Easily annoyed or irritable '3 3 3 ' 0  Afraid - awful might happen '2 3 3 ' 0  Total GAD 7 Score '20 20 21 ' 0  Anxiety Difficulty Somewhat difficult Somewhat difficult Extremely difficult Not difficult at all   Most Recent Fall Screen:    03/19/2021   10:22 AM 09/08/2020   11:11 AM  Fall Risk   Falls in the past year? 0 1  Number falls in past yr: 0 0  Injury with Fall? 0 1  Risk for fall due to : No Fall Risks No Fall Risks  Follow up Falls evaluation completed     All ROS negative except what is listed above and in the HPI.   Past medical history, surgical history, medications, allergies, family history and social history reviewed with patient today and changes made to appropriate areas of the chart.  Past Medical History:  Past Medical History:  Diagnosis Date   Abdominal bloating 08/24/2020   Abnormal weight loss 08/24/2020   Anxiety  Arthritis    Hands   Change in bowel habit 08/24/2020   Encounter for annual physical exam 08/24/2020   Encounter for screening mammogram for malignant neoplasm of breast 08/24/2020   Epigastric pain 08/24/2020   GERD (gastroesophageal reflux disease)    Hypertension    Hypokalemia 09/08/2020   Hypothyroid    MDD (major depressive disorder), recurrent episode, moderate ([place]) 08/24/2020   Pigmented skin lesions 08/24/2020   Rectal bleeding 08/24/2020   Medications:  Current Outpatient Medications on File Prior to Visit  Medication Sig   clonazePAM (KLONOPIN) 0.5 MG tablet Take 1 tablet (0.5 mg total) by mouth as needed for anxiety. No more than one time per day.   FLUoxetine (PROZAC) 20 MG capsule Take 1 capsule (20 mg total) by mouth daily. Take with 1m dose to make 628mdaily.   FLUoxetine (PROZAC) 40 MG capsule Take 1 capsule (40 mg total) by mouth daily. Take with 2051mo make 59m64mse daily.   levothyroxine (SYNTHROID) 25 MCG tablet Take 1 tablet (25 mcg total) by mouth daily before breakfast.   losartan-hydrochlorothiazide  (HYZAAR) 50-12.5 MG tablet Take 0.5 tablets by mouth daily.   Multiple Vitamin (MULTIVITAMIN WITH MINERALS) TABS tablet Take 1 tablet by mouth daily.   omeprazole (PRILOSEC) 20 MG capsule Take 1 capsule (20 mg total) by mouth 2 (two) times daily.   traZODone (DESYREL) 100 MG tablet Take 1 tablet (100 mg total) by mouth at bedtime as needed. for sleep   No current facility-administered medications on file prior to visit.   Surgical History:  Past Surgical History:  Procedure Laterality Date   INCISION AND DRAINAGE Right 09/03/2019   Procedure: INCISION AND DRAINAGE DOG BITE WOUND RIGHT HAND;  Surgeon: KuzmDaryll Brod;  [facility];  Laterality: Right;  IV REGIONAL FOREARM BLOCK   TONSILLECTOMY  3rd grade   UPPER ESOPHAGEAL ENDOSCOPIC ULTRASOUND (EUS) N/A 02/25/2017   Procedure: UPPER ESOPHAGEAL ENDOSCOPIC ULTRASOUND (EUS);  Surgeon: HungCarol Ada;  [facility];  Service: Endoscopy;  Laterality: N/A;   Allergies:  Allergies  Allergen Reactions   Nsaids Other (See Comments)    Other reaction(s): GI upset   Social History:  Social History   Socioeconomic History   Marital status: Single    Spouse name: Not on file   Number of children: Not on file   Years of education: Not on file   Highest education level: Not on file  Occupational History   Not on file  Tobacco Use   Smoking status: Never   Smokeless tobacco: Never  Vaping Use   Vaping Use: Never used  Substance and Sexual Activity   Alcohol use: Not Currently    Comment: ocassional   Drug use: No   Sexual activity: Never    Partners: Male  Other Topics Concern   Not on file  Social History Narrative   Not on file   Social Determinants of Health   Financial Resource Strain: Not on file  Food Insecurity: Not on file  Transportation Needs: Not on file  Physical Activity: Not on file  Stress: Not on file  Social Connections: Not on file  Intimate Partner Violence: Not on file    Social History   Tobacco Use  Smoking Status Never  Smokeless Tobacco Never   Social History   Substance and Sexual Activity  Alcohol Use Not Currently   Comment: ocassional   Family History:  Family History  Problem Relation Age of Onset  Uterine cancer Maternal Grandmother    Colon cancer Other        Paternal Great Grandmother   Breast cancer Other        Maternal Great Grandmother   Hypertension Father    Heart attack Father    Hypertension Brother    Lupus Mother    Osteoporosis Mother        Objective:    BP 122/81   Pulse (!) 52   Ht '5\' 2"'  (1.575 m)   Wt 121 lb (54.9 kg)   LMP 06/03/2012   SpO2 100%   BMI 22.13 kg/m   Wt Readings from Last 3 Encounters:  03/14/22 121 lb (54.9 kg)  07/17/21 129 lb (58.5 kg)  03/19/21 138 lb 3.2 oz (62.7 kg)    Physical Exam Vitals and nursing note reviewed.  Constitutional:      General: She is not in acute distress.    Appearance: Normal appearance.  HENT:     Head: Normocephalic and atraumatic.     Right Ear: Hearing, tympanic membrane, ear canal and external ear normal.     Left Ear: Hearing, tympanic membrane, ear canal and external ear normal.     Nose: Nose normal.     Right Sinus: No maxillary sinus tenderness or frontal sinus tenderness.     Left Sinus: No maxillary sinus tenderness or frontal sinus tenderness.     Mouth/Throat:     Lips: Pink.     Mouth: Mucous membranes are moist.     Pharynx: Oropharynx is clear.  Eyes:     General: Lids are normal. Vision grossly intact.     Extraocular Movements: Extraocular movements intact.     Conjunctiva/sclera: Conjunctivae normal.     Pupils: Pupils are equal, round, and reactive to light.     Funduscopic exam:    Right eye: Red reflex present.        Left eye: Red reflex present.    Visual Fields: Right eye visual fields normal and left eye visual fields normal.  Neck:     Thyroid: No thyromegaly.     Vascular: No carotid bruit.  Cardiovascular:      Rate and Rhythm: Normal rate and regular rhythm.     Chest Wall: PMI is not displaced.     Pulses: Normal pulses.          Dorsalis pedis pulses are 2+ on the right side and 2+ on the left side.       Posterior tibial pulses are 2+ on the right side and 2+ on the left side.     Heart sounds: Normal heart sounds. No murmur heard. Pulmonary:     Effort: Pulmonary effort is normal. No respiratory distress.     Breath sounds: Normal breath sounds.  Abdominal:     General: Abdomen is flat. Bowel sounds are normal. There is no distension.     Palpations: Abdomen is soft. There is no hepatomegaly, splenomegaly or mass.     Tenderness: There is no abdominal tenderness. There is no right CVA tenderness, left CVA tenderness, guarding or rebound.  Musculoskeletal:        General: Normal range of motion.     Cervical back: Full passive range of motion without pain, normal range of motion and neck supple. No tenderness.     Right lower leg: No edema.     Left lower leg: No edema.  Feet:     Left foot:     Toenail  Condition: Left toenails are normal.  Lymphadenopathy:     Cervical: No cervical adenopathy.     Upper Body:     Right upper body: No supraclavicular adenopathy.     Left upper body: No supraclavicular adenopathy.  Skin:    General: Skin is warm and dry.     Capillary Refill: Capillary refill takes less than 2 seconds.     Nails: There is no clubbing.  Neurological:     General: No focal deficit present.     Mental Status: She is alert and oriented to person, place, and time.     GCS: GCS eye subscore is 4. GCS verbal subscore is 5. GCS motor subscore is 6.     Sensory: Sensation is intact.     Motor: Motor function is intact.     Coordination: Coordination is intact.     Gait: Gait is intact.     Deep Tendon Reflexes: Reflexes are normal and symmetric.  Psychiatric:        Attention and Perception: Attention normal.        Mood and Affect: Mood normal.        Speech: Speech  normal.        Behavior: Behavior normal. Behavior is cooperative.        Thought Content: Thought content normal.        Cognition and Memory: Cognition and memory normal.        Judgment: Judgment normal.     Results for orders placed or performed in visit on 03/14/21  Lipid panel  Result Value Ref Range   Cholesterol, Total 219 (H) 100 - 199 mg/dL   Triglycerides 99 0 - 149 mg/dL   HDL 69 >39 mg/dL   VLDL Cholesterol Cal 17 5 - 40 mg/dL   LDL Chol Calc (NIH) 133 (H) 0 - 99 mg/dL   Chol/HDL Ratio 3.2 0.0 - 4.4 ratio  Comprehensive metabolic panel  Result Value Ref Range   Glucose 90 70 - 99 mg/dL   BUN 16 6 - 24 mg/dL   Creatinine, Ser 0.61 0.57 - 1.00 mg/dL   eGFR 103 >59 mL/min/1.73   BUN/Creatinine Ratio 26 (H) 9 - 23   Sodium 140 134 - 144 mmol/L   Potassium 4.4 3.5 - 5.2 mmol/L   Chloride 103 96 - 106 mmol/L   CO2 19 (L) 20 - 29 mmol/L   Calcium 9.6 8.7 - 10.2 mg/dL   Total Protein 6.4 6.0 - 8.5 g/dL   Albumin 4.4 3.8 - 4.9 g/dL   Globulin, Total 2.0 1.5 - 4.5 g/dL   Albumin/Globulin Ratio 2.2 1.2 - 2.2   Bilirubin Total <0.2 0.0 - 1.2 mg/dL   Alkaline Phosphatase 70 44 - 121 IU/L   AST 17 0 - 40 IU/L   ALT 11 0 - 32 IU/L  Specimen status report  Result Value Ref Range   specimen status report Comment     MMUNIZATIONS:   - Tdap: Tetanus vaccination status reviewed: last tetanus booster within 10 years. - Influenza:  completed at work - Pneumovax: Not applicable - Prevnar: Not applicable - HPV: Not applicable - Zostavax vaccine:  Recommended  SCREENING COMPLETED: - Pap smear:  UTD - STI testing:Not Applicable -Mammogram:  UTD - Colonoscopy: { UTD - Bone Density: {Not Applicable -Hearing Test: Not Applicable -Spirometry: Not Applicable     Assessment & Plan:   Problem List Items Addressed This Visit     Hypothyroid    Chronic. Labs  reviewed. No changes. F/U in 6 months with Dr. Burnard Bunting or may transition to my new practice.       Hypertension     Chronic. Stable. Labs reviewed. No changes at this time. Continue current treatment. F/U in 6 months with Dr. Burnard Bunting or may transition to my new practice.       Encounter for annual physical exam - Primary    CPE today with no abnormalities noted on exam.  Labs pending. Will make changes as necessary based on results.  Review of HM activities and recommendations discussed and provided on AVS Anticipatory guidance, diet, and exercise recommendations provided.  Medications, allergies, and hx reviewed and updated as necessary.  Plan to f/u with CPE in 1 year or sooner for acute/chronic health needs as directed.        Hyperlipidemia    Chronic. Labs needed. Will plan to repeat at 6 month f/u      Prediabetes    Chronic. Stable. Labs reviewed. Diet and exercise recommendations. F/U in 6 months with Dr. Burnard Bunting or transition to my new practice.       Depression, recurrent (HCC)    Chronic. Improved. New job has made this much easier. Continue treatment at this time. Working with psychology. F/U as needed.       Other Visit Diagnoses     Health care maintenance              Follow up plan: Return in about 1 year (around 03/15/2023) for CPE in 1 year or sooner if needed.  NEXT PREVENTATIVE PHYSICAL DUE IN 1 YEAR.  PATIENT COUNSELING PROVIDED FOR ALL ADULT PATIENTS:  Consume a well balanced diet low in saturated fats, cholesterol, and moderation in carbohydrates.   This can be as simple as monitoring portion sizes and cutting back on sugary beverages such as soda and juice to start with.    Daily water consumption of at least 64 ounces.  Physical activity at least 180 minutes per week, if just starting out.   This can be as simple as taking the stairs instead of the elevator and walking 2-3 laps around the office  purposefully every day.   STD protection, partner selection, and regular testing if high risk.  Limited consumption of alcoholic beverages if alcohol is  consumed.  For women, I recommend no more than 7 alcoholic beverages per week, spread out throughout the week.  Avoid "binge" drinking or consuming large quantities of alcohol in one setting.   Please let me know if you feel you may need help with reduction or quitting alcohol consumption.   Avoidance of nicotine, if used.  Please let me know if you feel you may need help with reduction or quitting nicotine use.   Daily mental health attention.  This can be in the form of 5 minute daily meditation, prayer, journaling, yoga, reflection, etc.   Purposeful attention to your emotions and mental state can significantly improve your overall wellbeing and Health.  Please know that I am here to help you with all of your health care goals and am happy to work with you to find a solution that works best for you.  The greatest advice I have received with any changes in life are to take it one step at a time, that even means if all you can focus on is the next 60 seconds, then do that and celebrate your victories.  With any changes in life, you will have set backs, and that  is OK. The important thing to remember is, if you have a set back, it is not a failure, it is an opportunity to try again!  Health Maintenance Recommendations Screening Testing Mammogram Every 1 -2 years based on history and risk factors Starting at age 28 Pap Smear Ages 21-39 every 3 years Ages 9-65 every 5 years with HPV testing More frequent testing may be required based on results and history Colon Cancer Screening Every 1-10 years based on test performed, risk factors, and history Starting at age 70 Bone Density Screening Every 2-10 years based on history Starting at age 58 for women Recommendations for men differ based on medication usage, history, and risk factors AAA Screening One time ultrasound Men 59-24 years old who have every smoked Lung Cancer Screening Low Dose Lung CT every 12 months Age 36-80 years with  a 30 pack-year smoking history who still smoke or who have quit within the last 15 years  Screening Labs Routine  Labs: Complete Blood Count (CBC), Complete Metabolic Panel (CMP), Cholesterol (Lipid Panel) Every 6-12 months based on history and medications May be recommended more frequently based on current conditions or previous results Hemoglobin A1c Lab Every 3-12 months based on history and previous results Starting at age 54 or earlier with diagnosis of diabetes, high cholesterol, BMI >26, and/or risk factors Frequent monitoring for patients with diabetes to ensure blood sugar control Thyroid Panel (TSH w/ T3 & T4) Every 6 months based on history, symptoms, and risk factors May be repeated more often if on medication HIV One time testing for all patients 51 and older May be repeated more frequently for patients with increased risk factors or exposure Hepatitis C One time testing for all patients 4 and older May be repeated more frequently for patients with increased risk factors or exposure Gonorrhea, Chlamydia Every 12 months for all sexually active persons 13-24 years Additional monitoring may be recommended for those who are considered high risk or who have symptoms PSA Men 6-26 years old with risk factors Additional screening may be recommended from age 49-69 based on risk factors, symptoms, and history  Vaccine Recommendations Tetanus Booster All adults every 10 years Flu Vaccine All patients 6 months and older every year COVID Vaccine All patients 12 years and older Initial dosing with booster May recommend additional booster based on age and health history HPV Vaccine 2 doses all patients age 87-26 Dosing may be considered for patients over 26 Shingles Vaccine (Shingrix) 2 doses all adults 59 years and older Pneumonia (Pneumovax 23) All adults 26 years and older May recommend earlier dosing based on health history Pneumonia (Prevnar 41) All adults 14 years and  older Dosed 1 year after Pneumovax 23  Additional Screening, Testing, and Vaccinations may be recommended on an individualized basis based on family history, health history, risk factors, and/or exposure.

## 2022-03-20 ENCOUNTER — Other Ambulatory Visit (HOSPITAL_COMMUNITY): Payer: Self-pay

## 2022-03-29 ENCOUNTER — Encounter (HOSPITAL_BASED_OUTPATIENT_CLINIC_OR_DEPARTMENT_OTHER): Payer: Self-pay | Admitting: Nurse Practitioner

## 2022-03-29 NOTE — Assessment & Plan Note (Signed)

## 2022-03-29 NOTE — Assessment & Plan Note (Signed)
Chronic. Improved. New job has made this much easier. Continue treatment at this time. Working with psychology. F/U as needed.

## 2022-03-29 NOTE — Assessment & Plan Note (Signed)
Chronic. Labs needed. Will plan to repeat at 6 month f/u

## 2022-03-29 NOTE — Assessment & Plan Note (Signed)
Chronic. Stable. Labs reviewed. Diet and exercise recommendations. F/U in 6 months with Dr. Burnard Bunting or transition to my new practice.

## 2022-03-29 NOTE — Assessment & Plan Note (Addendum)
Chronic. Stable. Labs reviewed. No changes at this time. Continue current treatment. F/U in 6 months with Dr. Burnard Bunting or may transition to my new practice.

## 2022-03-29 NOTE — Assessment & Plan Note (Addendum)
Chronic. Labs reviewed. No changes. F/U in 6 months with Dr. Burnard Bunting or may transition to my new practice.

## 2022-04-08 ENCOUNTER — Other Ambulatory Visit (HOSPITAL_COMMUNITY): Payer: Self-pay

## 2022-04-08 ENCOUNTER — Other Ambulatory Visit (HOSPITAL_BASED_OUTPATIENT_CLINIC_OR_DEPARTMENT_OTHER): Payer: Self-pay | Admitting: Nurse Practitioner

## 2022-04-08 DIAGNOSIS — F419 Anxiety disorder, unspecified: Secondary | ICD-10-CM

## 2022-04-08 DIAGNOSIS — E039 Hypothyroidism, unspecified: Secondary | ICD-10-CM

## 2022-04-08 DIAGNOSIS — F339 Major depressive disorder, recurrent, unspecified: Secondary | ICD-10-CM

## 2022-04-08 MED ORDER — LEVOTHYROXINE SODIUM 25 MCG PO TABS
25.0000 ug | ORAL_TABLET | Freq: Every day | ORAL | 2 refills | Status: DC
  Filled 2022-04-08: qty 90, 90d supply, fill #0
  Filled 2022-07-01: qty 90, 90d supply, fill #1

## 2022-04-08 MED ORDER — CLONAZEPAM 0.5 MG PO TABS: 0.5000 mg | ORAL_TABLET | ORAL | 0 refills | Status: DC | PRN

## 2022-04-30 ENCOUNTER — Other Ambulatory Visit (HOSPITAL_COMMUNITY): Payer: Self-pay

## 2022-05-06 ENCOUNTER — Other Ambulatory Visit (HOSPITAL_COMMUNITY): Payer: Self-pay

## 2022-05-06 ENCOUNTER — Other Ambulatory Visit (HOSPITAL_BASED_OUTPATIENT_CLINIC_OR_DEPARTMENT_OTHER): Payer: Self-pay | Admitting: Nurse Practitioner

## 2022-05-06 DIAGNOSIS — F419 Anxiety disorder, unspecified: Secondary | ICD-10-CM

## 2022-05-06 DIAGNOSIS — F339 Major depressive disorder, recurrent, unspecified: Secondary | ICD-10-CM

## 2022-05-06 MED ORDER — FLUOXETINE HCL 20 MG PO CAPS
20.0000 mg | ORAL_CAPSULE | Freq: Every day | ORAL | 3 refills | Status: DC
  Filled 2022-05-06: qty 90, 90d supply, fill #0
  Filled 2022-08-05: qty 90, 90d supply, fill #1

## 2022-05-06 MED ORDER — FLUOXETINE HCL 40 MG PO CAPS
40.0000 mg | ORAL_CAPSULE | Freq: Every day | ORAL | 3 refills | Status: DC
  Filled 2022-05-06: qty 90, 90d supply, fill #0
  Filled 2022-08-05: qty 90, 90d supply, fill #1

## 2022-06-06 ENCOUNTER — Other Ambulatory Visit (HOSPITAL_BASED_OUTPATIENT_CLINIC_OR_DEPARTMENT_OTHER): Payer: Self-pay | Admitting: Nurse Practitioner

## 2022-06-06 DIAGNOSIS — F339 Major depressive disorder, recurrent, unspecified: Secondary | ICD-10-CM

## 2022-06-06 DIAGNOSIS — F5101 Primary insomnia: Secondary | ICD-10-CM

## 2022-06-06 DIAGNOSIS — F419 Anxiety disorder, unspecified: Secondary | ICD-10-CM

## 2022-06-06 NOTE — Telephone Encounter (Signed)
Last apt 03/14/22 refill request

## 2022-06-07 MED ORDER — TRAZODONE HCL 100 MG PO TABS
100.0000 mg | ORAL_TABLET | Freq: Every evening | ORAL | 3 refills | Status: DC | PRN
  Filled 2022-06-07: qty 90, 90d supply, fill #0
  Filled 2022-09-01: qty 90, 90d supply, fill #1

## 2022-06-10 ENCOUNTER — Other Ambulatory Visit (HOSPITAL_COMMUNITY): Payer: Self-pay

## 2022-08-05 ENCOUNTER — Other Ambulatory Visit (HOSPITAL_COMMUNITY): Payer: Self-pay

## 2022-08-20 ENCOUNTER — Other Ambulatory Visit (HOSPITAL_BASED_OUTPATIENT_CLINIC_OR_DEPARTMENT_OTHER): Payer: Self-pay | Admitting: Family Medicine

## 2022-08-20 ENCOUNTER — Telehealth: Payer: Self-pay | Admitting: Nurse Practitioner

## 2022-08-20 ENCOUNTER — Other Ambulatory Visit (HOSPITAL_COMMUNITY): Payer: Self-pay

## 2022-08-20 DIAGNOSIS — F339 Major depressive disorder, recurrent, unspecified: Secondary | ICD-10-CM

## 2022-08-20 DIAGNOSIS — F419 Anxiety disorder, unspecified: Secondary | ICD-10-CM

## 2022-08-20 MED ORDER — CLONAZEPAM 0.5 MG PO TABS
0.5000 mg | ORAL_TABLET | ORAL | 1 refills | Status: DC | PRN
  Filled 2022-08-20: qty 15, 15d supply, fill #0

## 2022-08-20 NOTE — Telephone Encounter (Signed)
Pt needs refill Clonazepam she is out &  I went ahead & scheduled her a CPE in April-lm

## 2022-08-26 ENCOUNTER — Other Ambulatory Visit (HOSPITAL_COMMUNITY): Payer: Self-pay

## 2022-09-11 ENCOUNTER — Encounter: Payer: Self-pay | Admitting: Physician Assistant

## 2022-09-11 ENCOUNTER — Ambulatory Visit: Payer: 59 | Admitting: Physician Assistant

## 2022-09-11 ENCOUNTER — Other Ambulatory Visit (INDEPENDENT_AMBULATORY_CARE_PROVIDER_SITE_OTHER): Payer: 59

## 2022-09-11 DIAGNOSIS — M25512 Pain in left shoulder: Secondary | ICD-10-CM

## 2022-09-11 MED ORDER — LIDOCAINE HCL 1 % IJ SOLN
2.0000 mL | INTRAMUSCULAR | Status: AC | PRN
  Administered 2022-09-11: 2 mL

## 2022-09-11 MED ORDER — METHYLPREDNISOLONE ACETATE 40 MG/ML IJ SUSP
80.0000 mg | INTRAMUSCULAR | Status: AC | PRN
  Administered 2022-09-11: 80 mg via INTRA_ARTICULAR

## 2022-09-11 MED ORDER — BUPIVACAINE HCL 0.25 % IJ SOLN
2.0000 mL | INTRAMUSCULAR | Status: AC | PRN
  Administered 2022-09-11: 2 mL via INTRA_ARTICULAR

## 2022-09-11 NOTE — Progress Notes (Signed)
Office Visit Note   Patient: Sue [name]           Date of Birth: 1960-09-01           MRN: [id number] Visit Date: 09/11/2022              Requested by: Tollie Eth, NP [address] [place],  [place] [address] PCP: Tollie Eth, NP   Assessment & Plan: Visit Diagnoses:  1. Acute pain of left shoulder     Plan: Saint is a pleasant 62 year old woman with a chief complaint of left shoulder pain for the last few weeks.  She denies any previous history denies any injury.  She does walk her dog and raises her arm up quite a bit to get around the mailbox.  Not sure if this contributed to it.  She felt no pop.  She is now having trouble with range of motion secondary to pain especially when she raises her arm above her head.  She is currently not taking any medication as she cannot take anti-inflammatories and she gets itching with narcotics.  Pain goes from the shoulder and also down into the inside of the arm.  Exam today combination of rotator cuff tendinitis and I also think she has some biceps findings.  We discussed the natural history of this.  We could try an steroid injection today which she is willing to do and see how this does ultimately be should improve we could consider an MRI  Follow-Up Instructions: No follow-ups on file.   Orders:  Orders Placed This Encounter  Procedures   XR Shoulder Left   No orders of the defined types were placed in this encounter.     Procedures: Large Joint Inj: L subacromial bursa on 09/11/2022 4:10 PM Indications: diagnostic evaluation and pain Details: 25 G 1.5 in needle, posterior approach  Arthrogram: No  Medications: 2 mL lidocaine 1 %; 80 mg methylPREDNISolone acetate 40 MG/ML; 2 mL bupivacaine 0.25 % Outcome: tolerated well, no immediate complications Procedure, treatment alternatives, risks and benefits explained, specific risks discussed. Consent was given by the patient.     Clinical Data: No additional  findings.   Subjective: Chief Complaint  Patient presents with   Left Shoulder - Pain    HPI pleasant 62 year old woman with history of left shoulder pain.  Denies any injury this is been going on for few weeks.  She is having pain with elevating her arm.  She is right-hand dominant.  Describes her pain as moderate  Review of Systems  All other systems reviewed and are negative.    Objective: Vital Signs: LMP 06/03/2012   Physical Exam Constitutional:      Appearance: Normal appearance.  Pulmonary:     Effort: Pulmonary effort is normal.  Musculoskeletal:     Cervical back: Normal range of motion.  Neurological:     General: No focal deficit present.     Mental Status: She is alert and oriented to person, place, and time.  Psychiatric:        Mood and Affect: Mood normal.        Behavior: Behavior normal.    Ortho Exam Examination of her left shoulder she has active elevation to about 130 degrees passively I can extend her to almost 180 but she is quite painful negative drop arm test pain with internal rotation behind her back.  Her strength is overall intact but painful.  She has good grip strength no neck pain.  Distally neurovascular intact distal biceps is intact she is tender over the proximal biceps she has a positive empty can test and a positive speeds test Specialty Comments:  No specialty comments available.  Imaging: No results found.   PMFS History: Patient Active Problem List   Diagnosis Date Noted   Rib pain on left side 07/20/2021   Fall 07/19/2021   Depression, recurrent 04/16/2021   Hyperlipidemia 09/08/2020   Prediabetes 09/08/2020   Osteopenia of neck of right femur 08/24/2020   Encounter for annual physical exam 08/24/2020   [place] node 08/24/2020   Seborrheic keratosis 08/24/2020   Family history of malignant neoplasm of gastrointestinal tract 08/24/2020   Gastro-esophageal reflux disease without esophagitis 07/06/2019   Insomnia  07/06/2019   Hypothyroid    Hypertension    Anxiety    Past Medical History:  Diagnosis Date   Abdominal bloating 08/24/2020   Abnormal weight loss 08/24/2020   Anxiety    Arthritis    Hands   Change in bowel habit 08/24/2020   Encounter for annual physical exam 08/24/2020   Encounter for screening mammogram for malignant neoplasm of breast 08/24/2020   Epigastric pain 08/24/2020   GERD (gastroesophageal reflux disease)    Hypertension    Hypokalemia 09/08/2020   Hypothyroid    MDD (major depressive disorder), recurrent episode, moderate 08/24/2020   Pigmented skin lesions 08/24/2020   Rectal bleeding 08/24/2020    Family History  Problem Relation Age of Onset   Uterine cancer Maternal Grandmother    Colon cancer Other        Paternal Great Grandmother   Breast cancer Other        Maternal Great Grandmother   Hypertension Father    Heart attack Father    Hypertension Brother    Lupus Mother    Osteoporosis Mother     Past Surgical History:  Procedure Laterality Date   INCISION AND DRAINAGE Right 09/03/2019   Procedure: INCISION AND DRAINAGE DOG BITE WOUND RIGHT HAND;  Surgeon: Cindee Salt, MD;  Location: [facility] OR;  Service: Orthopedics;  Laterality: Right;  IV REGIONAL FOREARM BLOCK   TONSILLECTOMY  3rd grade   UPPER ESOPHAGEAL ENDOSCOPIC ULTRASOUND (EUS) N/A 02/25/2017   Procedure: UPPER ESOPHAGEAL ENDOSCOPIC ULTRASOUND (EUS);  Surgeon: Jeani Hawking, MD;  Location: [facility];  Service: Endoscopy;  Laterality: N/A;   Social History   Occupational History   Not on file  Tobacco Use   Smoking status: Never   Smokeless tobacco: Never  Vaping Use   Vaping Use: Never used  Substance and Sexual Activity   Alcohol use: Not Currently    Comment: ocassional   Drug use: No   Sexual activity: Never    Partners: Male

## 2022-09-18 ENCOUNTER — Telehealth: Payer: Self-pay | Admitting: Nurse Practitioner

## 2022-09-18 ENCOUNTER — Other Ambulatory Visit: Payer: 59

## 2022-09-18 DIAGNOSIS — Z0000 Encounter for general adult medical examination without abnormal findings: Secondary | ICD-10-CM

## 2022-09-18 LAB — VITAMIN D 25 HYDROXY (VIT D DEFICIENCY, FRACTURES)

## 2022-09-18 LAB — COMPREHENSIVE METABOLIC PANEL

## 2022-09-18 LAB — LIPID PANEL

## 2022-09-18 LAB — CBC WITH DIFFERENTIAL/PLATELET
EOS (ABSOLUTE): 0.2 10*3/uL (ref 0.0–0.4)
Hematocrit: 40.1 % (ref 34.0–46.6)
MCHC: 33.7 g/dL (ref 31.5–35.7)
RBC: 4.48 x10E6/uL (ref 3.77–5.28)
WBC: 11.1 10*3/uL — ABNORMAL HIGH (ref 3.4–10.8)

## 2022-09-18 NOTE — Telephone Encounter (Signed)
Absolutely. I have placed the orders for her. Please remind her to fast for at least 8 hours. Water and black coffee are OK. Thank you!

## 2022-09-18 NOTE — Telephone Encounter (Signed)
Pt has a physical with you next week and wants to come in before for labs. Is this ok?

## 2022-09-19 LAB — CBC WITH DIFFERENTIAL/PLATELET
Basophils Absolute: 0.1 10*3/uL (ref 0.0–0.2)
Basos: 1 %
Eos: 2 %
Hemoglobin: 13.5 g/dL (ref 11.1–15.9)
Immature Grans (Abs): 0.1 10*3/uL (ref 0.0–0.1)
Immature Granulocytes: 1 %
Lymphocytes Absolute: 4.4 10*3/uL — ABNORMAL HIGH (ref 0.7–3.1)
Lymphs: 40 %
MCH: 30.1 pg (ref 26.6–33.0)
MCV: 90 fL (ref 79–97)
Monocytes Absolute: 0.7 10*3/uL (ref 0.1–0.9)
Monocytes: 6 %
Neutrophils Absolute: 5.6 10*3/uL (ref 1.4–7.0)
Neutrophils: 50 %
Platelets: 395 10*3/uL (ref 150–450)
RDW: 12.4 % (ref 11.7–15.4)

## 2022-09-19 LAB — HEMOGLOBIN A1C
Est. average glucose Bld gHb Est-mCnc: 111 mg/dL
Hgb A1c MFr Bld: 5.5 % (ref 4.8–5.6)

## 2022-09-19 LAB — LIPID PANEL
Chol/HDL Ratio: 2.4 ratio (ref 0.0–4.4)
Cholesterol, Total: 242 mg/dL — ABNORMAL HIGH (ref 100–199)
HDL: 102 mg/dL (ref >39)
VLDL Cholesterol Cal: 17 mg/dL (ref 5–40)

## 2022-09-19 LAB — COMPREHENSIVE METABOLIC PANEL
ALT: 12 IU/L (ref 0–32)
AST: 19 IU/L (ref 0–40)
Albumin/Globulin Ratio: 2.2 (ref 1.2–2.2)
Albumin: 4.7 g/dL (ref 3.8–4.9)
BUN/Creatinine Ratio: 16 (ref 12–28)
BUN: 10 mg/dL (ref 8–27)
CO2: 23 mmol/L (ref 20–29)
Calcium: 10 mg/dL (ref 8.7–10.3)
Creatinine, Ser: 0.64 mg/dL (ref 0.57–1.00)
Globulin, Total: 2.1 g/dL (ref 1.5–4.5)
Sodium: 135 mmol/L (ref 134–144)
Total Protein: 6.8 g/dL (ref 6.0–8.5)
eGFR: 101 mL/min/{1.73_m2} (ref >59)

## 2022-09-19 LAB — TSH: TSH: 2.42 u[IU]/mL (ref 0.450–4.500)

## 2022-09-24 ENCOUNTER — Encounter: Payer: Self-pay | Admitting: Nurse Practitioner

## 2022-09-24 ENCOUNTER — Other Ambulatory Visit (HOSPITAL_COMMUNITY): Payer: Self-pay

## 2022-09-24 ENCOUNTER — Ambulatory Visit (INDEPENDENT_AMBULATORY_CARE_PROVIDER_SITE_OTHER): Payer: 59 | Admitting: Nurse Practitioner

## 2022-09-24 VITALS — BP 128/84 | HR 57 | Ht 62.0 in | Wt 114.8 lb

## 2022-09-24 DIAGNOSIS — F5101 Primary insomnia: Secondary | ICD-10-CM | POA: Diagnosis not present

## 2022-09-24 DIAGNOSIS — Z23 Encounter for immunization: Secondary | ICD-10-CM | POA: Diagnosis not present

## 2022-09-24 DIAGNOSIS — K219 Gastro-esophageal reflux disease without esophagitis: Secondary | ICD-10-CM

## 2022-09-24 DIAGNOSIS — F339 Major depressive disorder, recurrent, unspecified: Secondary | ICD-10-CM

## 2022-09-24 DIAGNOSIS — R7303 Prediabetes: Secondary | ICD-10-CM | POA: Diagnosis not present

## 2022-09-24 DIAGNOSIS — I10 Essential (primary) hypertension: Secondary | ICD-10-CM | POA: Diagnosis not present

## 2022-09-24 DIAGNOSIS — E782 Mixed hyperlipidemia: Secondary | ICD-10-CM | POA: Diagnosis not present

## 2022-09-24 DIAGNOSIS — E039 Hypothyroidism, unspecified: Secondary | ICD-10-CM

## 2022-09-24 DIAGNOSIS — Z0000 Encounter for general adult medical examination without abnormal findings: Secondary | ICD-10-CM | POA: Diagnosis not present

## 2022-09-24 DIAGNOSIS — F419 Anxiety disorder, unspecified: Secondary | ICD-10-CM | POA: Diagnosis not present

## 2022-09-24 MED ORDER — FLUOXETINE HCL 20 MG PO CAPS
20.0000 mg | ORAL_CAPSULE | Freq: Every day | ORAL | 3 refills | Status: DC
Start: 2022-09-24 — End: 2023-09-29
  Filled 2022-09-24 – 2022-11-04 (×2): qty 90, 90d supply, fill #0
  Filled 2023-01-30: qty 90, 90d supply, fill #1
  Filled 2023-05-05: qty 90, 90d supply, fill #2
  Filled 2023-07-30: qty 90, 90d supply, fill #3

## 2022-09-24 MED ORDER — CLONAZEPAM 1 MG PO TABS
0.5000 mg | ORAL_TABLET | Freq: Every day | ORAL | 2 refills | Status: DC | PRN
Start: 2022-09-24 — End: 2023-03-24
  Filled 2022-09-24: qty 30, 30d supply, fill #0

## 2022-09-24 MED ORDER — ZOSTER VAC RECOMB ADJUVANTED 50 MCG/0.5ML IM SUSR
0.5000 mL | INTRAMUSCULAR | 1 refills | Status: DC
Start: 2022-09-24 — End: 2023-09-29

## 2022-09-24 MED ORDER — LEVOTHYROXINE SODIUM 25 MCG PO TABS
25.0000 ug | ORAL_TABLET | Freq: Every day | ORAL | 2 refills | Status: DC
Start: 2022-09-24 — End: 2023-06-23
  Filled 2022-09-24: qty 90, 90d supply, fill #0
  Filled 2023-01-01: qty 90, 90d supply, fill #1
  Filled 2023-03-24: qty 90, 90d supply, fill #2

## 2022-09-24 MED ORDER — FLUOXETINE HCL 40 MG PO CAPS
40.0000 mg | ORAL_CAPSULE | Freq: Every day | ORAL | 3 refills | Status: DC
Start: 2022-09-24 — End: 2023-09-29
  Filled 2022-09-24 – 2022-11-04 (×2): qty 90, 90d supply, fill #0
  Filled 2023-01-30: qty 90, 90d supply, fill #1
  Filled 2023-05-05: qty 90, 90d supply, fill #2
  Filled 2023-07-30: qty 90, 90d supply, fill #3

## 2022-09-24 MED ORDER — TRAZODONE HCL 100 MG PO TABS
100.0000 mg | ORAL_TABLET | Freq: Every evening | ORAL | 3 refills | Status: DC | PRN
Start: 2022-09-24 — End: 2023-09-29
  Filled 2022-09-24 – 2022-12-17 (×2): qty 90, 90d supply, fill #0
  Filled 2023-03-24: qty 90, 90d supply, fill #1
  Filled 2023-06-23: qty 90, 90d supply, fill #2

## 2022-09-24 MED ORDER — OMEPRAZOLE 20 MG PO CPDR
20.0000 mg | DELAYED_RELEASE_CAPSULE | Freq: Two times a day (BID) | ORAL | 3 refills | Status: DC
Start: 2022-09-24 — End: 2023-09-29
  Filled 2022-09-24 – 2022-09-30 (×2): qty 180, 90d supply, fill #0
  Filled 2022-12-27: qty 180, 90d supply, fill #1
  Filled 2023-04-10: qty 180, 90d supply, fill #2
  Filled 2023-07-11: qty 180, 90d supply, fill #3

## 2022-09-24 MED ORDER — LOSARTAN POTASSIUM-HCTZ 50-12.5 MG PO TABS
0.5000 | ORAL_TABLET | Freq: Every day | ORAL | 3 refills | Status: DC
Start: 2022-09-24 — End: 2023-09-29
  Filled 2022-09-24 – 2022-12-27 (×2): qty 45, 90d supply, fill #0
  Filled 2023-05-05: qty 45, 90d supply, fill #1

## 2022-09-24 NOTE — Patient Instructions (Signed)
It was great to see you today!! Congratulations on the new home!!

## 2022-09-24 NOTE — Progress Notes (Signed)
Sue Clamp, DNP, AGNP-c [facility] [address] [place], [place] [address] Main Office [phone]  BP 128/84   Pulse (!) 57   Ht 5\' 2"  (1.575 m)   Wt 114 lb 12.8 oz (52.1 kg)   LMP 06/03/2012   BMI 21.00 kg/m    Subjective:    Patient ID: Sue [name], female    DOB: [birth year], 62 y.o.   MRN: [id number]  HPI: Sue [name] is a 62 y.o. female presenting on 09/24/2022 for comprehensive medical examination.   Current medical concerns include: Sue [name] reveals stress related to settling her grandfather's estate, which has been pending for 21 years. She describes the emotional and financial strain of potentially losing her home and deciding to cash in her 401(k) to purchase the property. This situation has caused her significant distress. Sue [name] is interested in increasing her dosage of clonazepam, which she has found helpful during stressful times.   Sue [name] expresses frustration with remembering to take her blood pressure medication and discusses adjusting the dosage of clonazepam with the clinician.   Pertinent items are noted in HPI.  IMMUNIZATIONS:   Flu: Flu vaccine postponed until flu season Prevnar 13: Prevnar 13 N/A for this patient Prevnar 20: Prevnar 20 N/A for this patient Pneumovax 23: Pneumovax 23 N/A for this patient Vac Shingrix: Shingrix due, prescription provided HPV: HPV N/A for this patient Tetanus: Tetanus completed in the last 10 years COVID: COVID completed, documentation in chart   HEALTH MAINTENANCE: Pap Smear HM Status: is up to date Mammogram HM Status: is up to date Colon Cancer Screening HM Status: is up to date Bone Density HM Status: is not applicable for this patient STI Testing HM Status: is not applicable for this patient Lung CT HM Status: is not applicable for this patient  She reports regular vision exams q1-5y: Yes  She reports regular dental exams q 76m:  Yes  The patient eats a regular, healthy diet. She  endorses exercise and/or activity of: Moderate 30 min 3-4x/wk Mode:     Most Recent Depression Screen:     09/24/2022    3:09 PM 07/18/2021    1:30 PM 03/19/2021   10:22 AM 09/08/2020   11:11 AM 08/24/2020   12:38 PM  Depression screen PHQ 2/9  Decreased Interest 0  3 1 3   Down, Depressed, Hopeless 0  3 2 3   PHQ - 2 Score 0  6 3 6   Altered sleeping   2 3 3   Tired, decreased energy   3 2 3   Change in appetite   2 2 3   Feeling bad or failure about yourself    3 3 3   Trouble concentrating   2 3 3   Moving slowly or fidgety/restless   3 3 3   Suicidal thoughts   0 0 0  PHQ-9 Score   21 19 24   Difficult doing work/chores   Somewhat difficult  Extremely dIfficult     Information is confidential and restricted. Go to Review Flowsheets to unlock data.   Most Recent Anxiety Screen:     03/19/2021   10:23 AM 09/08/2020   11:12 AM 08/24/2020   12:39 PM 10/19/2019    8:55 AM  GAD 7 : Generalized Anxiety Score  Nervous, Anxious, on Edge 3 2 3  0  Control/stop worrying 3 3 3  0  Worry too much - different things 3 3 3  0  Trouble relaxing 3 3 3  0  Restless 3 3 3  0  Easily annoyed or  irritable 3 3 3  0  Afraid - awful might happen 2 3 3  0  Total GAD 7 Score 20 20 21  0  Anxiety Difficulty Somewhat difficult Somewhat difficult Extremely difficult Not difficult at all   Most Recent Fall Screen:    09/24/2022    3:09 PM 03/19/2021   10:22 AM 09/08/2020   11:11 AM  Fall Risk   Falls in the past year? 0 0 1  Number falls in past yr: 0 0 0  Injury with Fall? 0 0 1  Risk for fall due to : No Fall Risks No Fall Risks No Fall Risks  Follow up Falls evaluation completed Falls evaluation completed     Past medical history, surgical history, medications, allergies, family history and social history reviewed with patient today and changes made to appropriate areas of the chart.  Past Medical History:  Past Medical History:  Diagnosis Date   Abdominal bloating 08/24/2020   Abnormal weight loss  08/24/2020   Anxiety    Arthritis    Hands   Change in bowel habit 08/24/2020   Encounter for annual physical exam 08/24/2020   Encounter for screening mammogram for malignant neoplasm of breast 08/24/2020   Epigastric pain 08/24/2020   GERD (gastroesophageal reflux disease)    Hypertension    Hypokalemia 09/08/2020   Hypothyroid    MDD (major depressive disorder), recurrent episode, moderate (HCC) 08/24/2020   Pigmented skin lesions 08/24/2020   Rectal bleeding 08/24/2020   Medications:  Current Outpatient Medications on File Prior to Visit  Medication Sig   Multiple Vitamin (MULTIVITAMIN WITH MINERALS) TABS tablet Take 1 tablet by mouth daily.   No current facility-administered medications on file prior to visit.   Surgical History:  Past Surgical History:  Procedure Laterality Date   INCISION AND DRAINAGE Right 09/03/2019   Procedure: INCISION AND DRAINAGE DOG BITE WOUND RIGHT HAND;  Surgeon: Cindee Salt, MD;  Location: [facility] OR;  Service: Orthopedics;  Laterality: Right;  IV REGIONAL FOREARM BLOCK   TONSILLECTOMY  3rd grade   UPPER ESOPHAGEAL ENDOSCOPIC ULTRASOUND (EUS) N/A 02/25/2017   Procedure: UPPER ESOPHAGEAL ENDOSCOPIC ULTRASOUND (EUS);  Surgeon: Jeani Hawking, MD;  Location: [facility];  Service: Endoscopy;  Laterality: N/A;   Allergies:  Allergies  Allergen Reactions   Nsaids Other (See Comments)    Other reaction(s): GI upset   Family History:  Family History  Problem Relation Age of Onset   Uterine cancer Maternal Grandmother    Colon cancer Other        Paternal Great Grandmother   Breast cancer Other        Maternal Great Grandmother   Hypertension Father    Heart attack Father    Hypertension Brother    Lupus Mother    Osteoporosis Mother        Objective:    BP 128/84   Pulse (!) 57   Ht 5\' 2"  (1.575 m)   Wt 114 lb 12.8 oz (52.1 kg)   LMP 06/03/2012   BMI 21.00 kg/m   Wt Readings from Last 3 Encounters:  09/24/22 114 lb 12.8 oz (52.1 kg)  03/14/22  121 lb (54.9 kg)  07/17/21 129 lb (58.5 kg)    Physical Exam Vitals and nursing note reviewed.  Constitutional:      General: She is not in acute distress.    Appearance: Normal appearance.  HENT:     Head: Normocephalic and atraumatic.     Right Ear: Hearing, tympanic membrane, ear canal  and external ear normal.     Left Ear: Hearing, tympanic membrane, ear canal and external ear normal.     Nose: Nose normal.     Right Sinus: No maxillary sinus tenderness or frontal sinus tenderness.     Left Sinus: No maxillary sinus tenderness or frontal sinus tenderness.     Mouth/Throat:     Lips: Pink.     Mouth: Mucous membranes are moist.     Pharynx: Oropharynx is clear.  Eyes:     General: Lids are normal. Vision grossly intact.     Extraocular Movements: Extraocular movements intact.     Conjunctiva/sclera: Conjunctivae normal.     Pupils: Pupils are equal, round, and reactive to light.     Funduscopic exam:    Right eye: Red reflex present.        Left eye: Red reflex present.    Visual Fields: Right eye visual fields normal and left eye visual fields normal.  Neck:     Thyroid: No thyromegaly.     Vascular: No carotid bruit.  Cardiovascular:     Rate and Rhythm: Normal rate and regular rhythm.     Chest Wall: PMI is not displaced.     Pulses: Normal pulses.          Dorsalis pedis pulses are 2+ on the right side and 2+ on the left side.       Posterior tibial pulses are 2+ on the right side and 2+ on the left side.     Heart sounds: Normal heart sounds. No murmur heard. Pulmonary:     Effort: Pulmonary effort is normal. No respiratory distress.     Breath sounds: Normal breath sounds.  Abdominal:     General: Abdomen is flat. Bowel sounds are normal. There is no distension.     Palpations: Abdomen is soft. There is no hepatomegaly, splenomegaly or mass.     Tenderness: There is no abdominal tenderness. There is no right CVA tenderness, left CVA tenderness, guarding or  rebound.  Musculoskeletal:        General: Normal range of motion.     Cervical back: Full passive range of motion without pain, normal range of motion and neck supple. No tenderness.     Right lower leg: No edema.     Left lower leg: No edema.  Feet:     Left foot:     Toenail Condition: Left toenails are normal.  Lymphadenopathy:     Cervical: No cervical adenopathy.     Upper Body:     Right upper body: No supraclavicular adenopathy.     Left upper body: No supraclavicular adenopathy.  Skin:    General: Skin is warm and dry.     Capillary Refill: Capillary refill takes less than 2 seconds.     Nails: There is no clubbing.  Neurological:     General: No focal deficit present.     Mental Status: She is alert and oriented to person, place, and time.     GCS: GCS eye subscore is 4. GCS verbal subscore is 5. GCS motor subscore is 6.     Sensory: Sensation is intact.     Motor: Motor function is intact.     Coordination: Coordination is intact.     Gait: Gait is intact.     Deep Tendon Reflexes: Reflexes are normal and symmetric.  Psychiatric:        Attention and Perception: Attention normal.  Mood and Affect: Mood normal.        Speech: Speech normal.        Behavior: Behavior normal. Behavior is cooperative.        Thought Content: Thought content normal.        Cognition and Memory: Cognition and memory normal.        Judgment: Judgment normal.     Results for orders placed or performed in visit on 09/18/22  Hemoglobin A1c  Result Value Ref Range   Hgb A1c MFr Bld 5.5 4.8 - 5.6 %   Est. average glucose Bld gHb Est-mCnc 111 mg/dL  Lipid panel  Result Value Ref Range   Cholesterol, Total 242 (H) 100 - 199 mg/dL   Triglycerides 161 0 - 149 mg/dL   HDL 096 >04 mg/dL   VLDL Cholesterol Cal 17 5 - 40 mg/dL   LDL Chol Calc (NIH) 540 (H) 0 - 99 mg/dL   Chol/HDL Ratio 2.4 0.0 - 4.4 ratio  TSH  Result Value Ref Range   TSH 2.420 0.450 - 4.500 uIU/mL  VITAMIN D 25  Hydroxy (Vit-D Deficiency, Fractures)  Result Value Ref Range   Vit D, 25-Hydroxy 45.8 30.0 - 100.0 ng/mL  Comprehensive metabolic panel  Result Value Ref Range   Glucose 88 70 - 99 mg/dL   BUN 10 8 - 27 mg/dL   Creatinine, Ser 9.81 0.57 - 1.00 mg/dL   eGFR 191 >47 WG/NFA/2.13   BUN/Creatinine Ratio 16 12 - 28   Sodium 135 134 - 144 mmol/L   Potassium 4.0 3.5 - 5.2 mmol/L   Chloride 95 (L) 96 - 106 mmol/L   CO2 23 20 - 29 mmol/L   Calcium 10.0 8.7 - 10.3 mg/dL   Total Protein 6.8 6.0 - 8.5 g/dL   Albumin 4.7 3.8 - 4.9 g/dL   Globulin, Total 2.1 1.5 - 4.5 g/dL   Albumin/Globulin Ratio 2.2 1.2 - 2.2   Bilirubin Total 0.4 0.0 - 1.2 mg/dL   Alkaline Phosphatase 66 44 - 121 IU/L   AST 19 0 - 40 IU/L   ALT 12 0 - 32 IU/L  CBC with Differential/Platelet  Result Value Ref Range   WBC 11.1 (H) 3.4 - 10.8 x10E3/uL   RBC 4.48 3.77 - 5.28 x10E6/uL   Hemoglobin 13.5 11.1 - 15.9 g/dL   Hematocrit 08.6 57.8 - 46.6 %   MCV 90 79 - 97 fL   MCH 30.1 26.6 - 33.0 pg   MCHC 33.7 31.5 - 35.7 g/dL   RDW 46.9 62.9 - 52.8 %   Platelets 395 150 - 450 x10E3/uL   Neutrophils 50 Not Estab. %   Lymphs 40 Not Estab. %   Monocytes 6 Not Estab. %   Eos 2 Not Estab. %   Basos 1 Not Estab. %   Neutrophils Absolute 5.6 1.4 - 7.0 x10E3/uL   Lymphocytes Absolute 4.4 (H) 0.7 - 3.1 x10E3/uL   Monocytes Absolute 0.7 0.1 - 0.9 x10E3/uL   EOS (ABSOLUTE) 0.2 0.0 - 0.4 x10E3/uL   Basophils Absolute 0.1 0.0 - 0.2 x10E3/uL   Immature Granulocytes 1 Not Estab. %   Immature Grans (Abs) 0.1 0.0 - 0.1 x10E3/uL         Assessment & Plan:   Problem List Items Addressed This Visit     Hypothyroid    Chronic hypothyroid managed with levothyroxine . Labs WNL on monitoring with no new or concerning symptoms.  Plan: - Continue current therapy.  - Refills as  needed for 1 year      Relevant Medications   levothyroxine (SYNTHROID) 25 MCG tablet   Hypertension    Chronic hypertension well controlled at this  time. Currently managed with losartan- HCTZ. No alarm symptoms are present.  Plan: - Labs completed - Refills as needed for 1 year      Relevant Medications   losartan-hydrochlorothiazide (HYZAAR) 50-12.5 MG tablet   Anxiety    Anxiety exacerbated at this time related to family concerns and the settlement of her grandfather's estate, which required her to pull money out of her retirement plan. We discussed healthy ways to help deal with stress. We will increase clonazepam for a short period of time to help during this event, but will need to consider dose reduction long term due to high risks of dependence.  Plan: - increase clonazepam to 1mg   - follow closely      Relevant Medications   clonazePAM (KLONOPIN) 1 MG tablet   FLUoxetine (PROZAC) 40 MG capsule   FLUoxetine (PROZAC) 20 MG capsule   traZODone (DESYREL) 100 MG tablet   Gastro-esophageal reflux disease without esophagitis    Chronic GERD well managed with omeprazole daily. No alarm symptoms present.  Plan: - continue omeprazole. - Bone density due at 65      Relevant Medications   omeprazole (PRILOSEC) 20 MG capsule   Encounter for annual physical exam - Primary    CPE today.  Labs pending. Will make changes as necessary based on results.  Review of HM activities and recommendations discussed and provided on AVS Anticipatory guidance, diet, and exercise recommendations provided.  Medications, allergies, and hx reviewed and updated as necessary.  Plan to f/u with CPE in 1 year or sooner for acute/chronic health needs as directed.        Hyperlipidemia    Labs reviewed. LDL is elevated slightly, but HDL is very high indicating cardioprotection. No medications at this time.  Plan: - continue to manage with diet and exercise.        Relevant Medications   losartan-hydrochlorothiazide (HYZAAR) 50-12.5 MG tablet   Prediabetes    Labs normal with no signs of BG elevation. Continue with diet and exercise.        Depression, recurrent (HCC)    Chronic. Well controlled on fluoxetine 60mg  and trazodone for sleep. No alarm symptoms at this time.  Plan: -continue current medications - refills as needed for 1 year - if symptoms change, please contact the office for f/u evaluation.       Relevant Medications   clonazePAM (KLONOPIN) 1 MG tablet   FLUoxetine (PROZAC) 40 MG capsule   FLUoxetine (PROZAC) 20 MG capsule   traZODone (DESYREL) 100 MG tablet   Insomnia   Relevant Medications   traZODone (DESYREL) 100 MG tablet   Other Visit Diagnoses     Need for shingles vaccine       Relevant Medications   Zoster Vaccine Adjuvanted [facility]) injection          Follow up plan: Return in about 1 year (around 09/24/2023) for CPE .  NEXT PREVENTATIVE PHYSICAL DUE IN 1 YEAR.  PATIENT COUNSELING PROVIDED FOR ALL ADULT PATIENTS: A well balanced diet low in saturated fats, cholesterol, and moderation in carbohydrates.  This can be as simple as monitoring portion sizes and cutting back on sugary beverages such as soda and juice to start with.    Daily water consumption of at least 64 ounces.  Physical activity at least 180 minutes  per week.  If just starting out, start 10 minutes a day and work your way up.   This can be as simple as taking the stairs instead of the elevator and walking 2-3 laps around the office  purposefully every day.   STD protection, partner selection, and regular testing if high risk.  Limited consumption of alcoholic beverages if alcohol is consumed. For men, I recommend no more than 14 alcoholic beverages per week, spread out throughout the week (max 2 per day). Avoid "binge" drinking or consuming large quantities of alcohol in one setting.  Please let me know if you feel you may need help with reduction or quitting alcohol consumption.   Avoidance of nicotine, if used. Please let me know if you feel you may need help with reduction or quitting nicotine use.   Daily  mental health attention. This can be in the form of 5 minute daily meditation, prayer, journaling, yoga, reflection, etc.  Purposeful attention to your emotions and mental state can significantly improve your overall wellbeing  and  Health.  Please know that I am here to help you with all of your health care goals and am happy to work with you to find a solution that works best for you.  The greatest advice I have received with any changes in life are to take it one step at a time, that even means if all you can focus on is the next 60 seconds, then do that and celebrate your victories.  With any changes in life, you will have set backs, and that is OK. The important thing to remember is, if you have a set back, it is not a failure, it is an opportunity to try again! Screening Testing Mammogram Every 1 -2 years based on history and risk factors Starting at age 61 Pap Smear Ages 21-39 every 3 years Ages 41-65 every 5 years with HPV testing More frequent testing may be required based on results and history Colon Cancer Screening Every 1-10 years based on test performed, risk factors, and history Starting at age 17 Bone Density Screening Every 2-10 years based on history Starting at age 37 for women Recommendations for men differ based on medication usage, history, and risk factors AAA Screening One time ultrasound Men 60-71 years old who have every smoked Lung Cancer Screening Low Dose Lung CT every 12 months Age 39-80 years with a 30 pack-year smoking history who still smoke or who have quit within the last 15 years   Screening Labs Routine  Labs: Complete Blood Count (CBC), Complete Metabolic Panel (CMP), Cholesterol (Lipid Panel) Every 6-12 months based on history and medications May be recommended more frequently based on current conditions or previous results Hemoglobin A1c Lab Every 3-12 months based on history and previous results Starting at age 33 or earlier with diagnosis  of diabetes, high cholesterol, BMI >26, and/or risk factors Frequent monitoring for patients with diabetes to ensure blood sugar control Thyroid Panel (TSH) Every 6 months based on history, symptoms, and risk factors May be repeated more often if on medication HIV One time testing for all patients 36 and older May be repeated more frequently for patients with increased risk factors or exposure Hepatitis C One time testing for all patients 61 and older May be repeated more frequently for patients with increased risk factors or exposure Gonorrhea, Chlamydia Every 12 months for all sexually active persons 13-24 years Additional monitoring may be recommended for those who are considered high risk or  who have symptoms Every 12 months for any woman on birth control, regardless of sexual activity PSA Men 70-66 years old with risk factors Additional screening may be recommended from age 66-69 based on risk factors, symptoms, and history  Vaccine Recommendations Tetanus Booster All adults every 10 years Flu Vaccine All patients 6 months and older every year COVID Vaccine All patients 12 years and older Initial dosing with booster May recommend additional booster based on age and health history HPV Vaccine 2 doses all patients age 68-26 Dosing may be considered for patients over 26 Shingles Vaccine (Shingrix) 2 doses all adults 55 years and older Pneumonia (Pneumovax 9) All adults 65 years and older May recommend earlier dosing based on health history One year apart from Prevnar 29 Pneumonia (Prevnar 54) All adults 65 years and older Dosed 1 year after Pneumovax 23 Pneumonia (Prevnar 20) One time alternative to the two dosing of 13 and 23 For all adults with initial dose of 23, 20 is recommended 1 year later For all adults with initial dose of 13, 23 is still recommended as second option 1 year later

## 2022-09-26 ENCOUNTER — Ambulatory Visit: Payer: 59 | Admitting: Physician Assistant

## 2022-09-30 ENCOUNTER — Other Ambulatory Visit (HOSPITAL_COMMUNITY): Payer: Self-pay

## 2022-10-01 NOTE — Assessment & Plan Note (Signed)
Chronic. Well controlled on fluoxetine 60mg  and trazodone for sleep. No alarm symptoms at this time.  Plan: -continue current medications - refills as needed for 1 year - if symptoms change, please contact the office for f/u evaluation.

## 2022-10-01 NOTE — Assessment & Plan Note (Signed)
CPE today. Labs pending. Will make changes as necessary based on results.  Review of HM activities and recommendations discussed and provided on AVS Anticipatory guidance, diet, and exercise recommendations provided.  Medications, allergies, and hx reviewed and updated as necessary.  Plan to f/u with CPE in 1 year or sooner for acute/chronic health needs as directed.   

## 2022-10-01 NOTE — Assessment & Plan Note (Signed)
Chronic GERD well managed with omeprazole daily. No alarm symptoms present.  Plan: - continue omeprazole. - Bone density due at 66

## 2022-10-01 NOTE — Assessment & Plan Note (Signed)
Labs normal with no signs of BG elevation. Continue with diet and exercise.

## 2022-10-01 NOTE — Assessment & Plan Note (Signed)
Chronic hypertension well controlled at this time. Currently managed with losartan- HCTZ. No alarm symptoms are present.  Plan: - Labs completed - Refills as needed for 1 year

## 2022-10-01 NOTE — Assessment & Plan Note (Signed)
Labs reviewed. LDL is elevated slightly, but HDL is very high indicating cardioprotection. No medications at this time.  Plan: - continue to manage with diet and exercise.

## 2022-10-01 NOTE — Assessment & Plan Note (Signed)
Anxiety exacerbated at this time related to family concerns and the settlement of her grandfather's estate, which required her to pull money out of her retirement plan. We discussed healthy ways to help deal with stress. We will increase clonazepam for a short period of time to help during this event, but will need to consider dose reduction long term due to high risks of dependence.  Plan: - increase clonazepam to 1mg   - follow closely

## 2022-10-01 NOTE — Assessment & Plan Note (Signed)
Chronic hypothyroid managed with levothyroxine . Labs WNL on monitoring with no new or concerning symptoms.  Plan: - Continue current therapy.  - Refills as needed for 1 year

## 2022-11-04 ENCOUNTER — Other Ambulatory Visit (HOSPITAL_COMMUNITY): Payer: Self-pay

## 2022-12-17 ENCOUNTER — Other Ambulatory Visit (HOSPITAL_COMMUNITY): Payer: Self-pay

## 2022-12-17 ENCOUNTER — Other Ambulatory Visit: Payer: Self-pay

## 2022-12-27 ENCOUNTER — Other Ambulatory Visit: Payer: Self-pay

## 2022-12-27 ENCOUNTER — Other Ambulatory Visit (HOSPITAL_COMMUNITY): Payer: Self-pay

## 2023-03-07 ENCOUNTER — Other Ambulatory Visit: Payer: Self-pay | Admitting: Nurse Practitioner

## 2023-03-07 DIAGNOSIS — Z1211 Encounter for screening for malignant neoplasm of colon: Secondary | ICD-10-CM

## 2023-03-07 DIAGNOSIS — Z1212 Encounter for screening for malignant neoplasm of rectum: Secondary | ICD-10-CM

## 2023-03-17 ENCOUNTER — Encounter (HOSPITAL_BASED_OUTPATIENT_CLINIC_OR_DEPARTMENT_OTHER): Payer: No Typology Code available for payment source | Admitting: Nurse Practitioner

## 2023-03-24 ENCOUNTER — Other Ambulatory Visit: Payer: Self-pay

## 2023-03-24 ENCOUNTER — Other Ambulatory Visit: Payer: Self-pay | Admitting: Nurse Practitioner

## 2023-03-24 DIAGNOSIS — F339 Major depressive disorder, recurrent, unspecified: Secondary | ICD-10-CM

## 2023-03-24 DIAGNOSIS — F419 Anxiety disorder, unspecified: Secondary | ICD-10-CM

## 2023-03-24 NOTE — Telephone Encounter (Signed)
Last apt 09/24/22

## 2023-03-25 ENCOUNTER — Other Ambulatory Visit (HOSPITAL_COMMUNITY): Payer: Self-pay

## 2023-03-25 MED ORDER — CLONAZEPAM 1 MG PO TABS
0.5000 mg | ORAL_TABLET | Freq: Every day | ORAL | 2 refills | Status: DC | PRN
Start: 2023-03-25 — End: 2024-03-29
  Filled 2023-03-25: qty 30, 30d supply, fill #0
  Filled 2023-06-23: qty 30, 30d supply, fill #1

## 2023-04-22 ENCOUNTER — Encounter: Payer: Self-pay | Admitting: Nurse Practitioner

## 2023-06-05 ENCOUNTER — Ambulatory Visit: Payer: 59 | Admitting: Nurse Practitioner

## 2023-06-05 ENCOUNTER — Encounter: Payer: Self-pay | Admitting: Nurse Practitioner

## 2023-06-05 VITALS — BP 126/82 | HR 65 | Wt 117.6 lb

## 2023-06-05 DIAGNOSIS — L989 Disorder of the skin and subcutaneous tissue, unspecified: Secondary | ICD-10-CM

## 2023-06-05 DIAGNOSIS — F339 Major depressive disorder, recurrent, unspecified: Secondary | ICD-10-CM | POA: Diagnosis not present

## 2023-06-05 DIAGNOSIS — F419 Anxiety disorder, unspecified: Secondary | ICD-10-CM | POA: Diagnosis not present
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
## 2023-06-05 NOTE — Patient Instructions (Signed)
Cryotherapy  Cryotherapy is the treatment of lesions with the application of a cold substance.  In most cases, liquid nitrogen is used to destroy the lesion(s).  Liquid nitrogen is so cold, -196 Celsius, it feels like it is burning when it is applied.  After treatment with liquid nitrogen, there may be some burning sensation or pain that can last up to 24 hours.  The area may also be swollen and red.  The discomfort can be relieved with ibuprofen (Advil, Motrin), acetaminophen (Extra Strength Tylenol), or similar pain relief medication.  Within 24 to 48 hours, a blister may form.  Occasionally, these blisters will become filled with blood and become very dark.  This is no cause for concern.  The blister will gradually dry up over a period of several days, eventually separating from the healing skin below in about one to two weeks.  The surrounding skin will become red and the area may become itchy.  This is all part of the normal healing process.  Occasionally, the crusts will last as long as four weeks when certain deeper spots on the skin are treated.  Sometimes a permanent white mark or scar will be left after healing.  You may continue all of your normal activities as long as they do not cause pain in the treated areas.  It is okay to get the area wet.  After therapy or if the blister is still intact - You may treat it like normal skin.  Covering the area with a bandage may offer some comfort and protection from trauma but is not absolutely necessary.  If the blister is uncomfortable - Clean a small needle with rubbing alcohol, then gently make a small hole in the side of the blister to drain the blister fluid.  This often gives immediate relief.  Do not remove the blister roof, as the blister aids in healing.  In time it will fall off on its own.   Once the blister roof falls off or a sore forms -  . Clean the blister sites with soap and water.  Rinse the area and pat dry.  Do not force off the  blister roof or crust. . Apply an antibiotic ointment such as Polysporin or Bacitracin. . A bandage may be applied loosely over the blister until it is healed if desired. . Call our office if you are concerned it may be infected.  Some redness, itching and oozing is part of the normal healing process.  Signs of infection include increasing redness, increasing pain, swelling, heat, or yellow discharge. 

## 2023-06-10 ENCOUNTER — Encounter: Payer: Self-pay | Admitting: Nurse Practitioner

## 2023-06-10 DIAGNOSIS — L989 Disorder of the skin and subcutaneous tissue, unspecified: Secondary | ICD-10-CM | POA: Insufficient documentation

## 2023-06-10 NOTE — Assessment & Plan Note (Signed)
 Recurrent skin lesion noted to the right temple.  The area previously treated but not completely resolved.  At this time the area is right at the hairline and rubbing against the arm of her glasses.  She wishes for cryotherapy for removal which we have done in the past. Cryotherapy performed with liquid nitrogen.  Prior to procedure verbal consent obtained.  Area cleansed with alcohol swab. Cryotherapy focused on the lesion with 5 seconds of therapy on each area of lesion to ensure complete destruction of lesion. Antibiotic ointment and bandage placed. Patient tolerated well.  Instructions provided on after care.

## 2023-06-10 NOTE — Assessment & Plan Note (Signed)
 Significant stress from family and financial issues leading to weight loss from 110 pounds. Stress affecting ability to eat and maintain weight. Discussed stress management and nutritional intake. - Provide emotional support and counseling - Encourage seeking additional mental health support - Monitor weight and nutritional status

## 2023-06-23 ENCOUNTER — Other Ambulatory Visit (HOSPITAL_COMMUNITY): Payer: Self-pay

## 2023-06-23 ENCOUNTER — Other Ambulatory Visit: Payer: Self-pay

## 2023-06-23 ENCOUNTER — Other Ambulatory Visit: Payer: Self-pay | Admitting: Nurse Practitioner

## 2023-06-23 DIAGNOSIS — E039 Hypothyroidism, unspecified: Secondary | ICD-10-CM

## 2023-06-23 MED ORDER — LEVOTHYROXINE SODIUM 25 MCG PO TABS
25.0000 ug | ORAL_TABLET | Freq: Every day | ORAL | 0 refills | Status: DC
Start: 2023-06-23 — End: 2023-09-29
  Filled 2023-06-23: qty 90, 90d supply, fill #0

## 2023-07-31 ENCOUNTER — Other Ambulatory Visit (HOSPITAL_COMMUNITY): Payer: Self-pay

## 2023-09-18 ENCOUNTER — Other Ambulatory Visit: Payer: Self-pay | Admitting: Nurse Practitioner

## 2023-09-18 DIAGNOSIS — Z1231 Encounter for screening mammogram for malignant neoplasm of breast: Secondary | ICD-10-CM

## 2023-09-24 ENCOUNTER — Other Ambulatory Visit

## 2023-09-24 DIAGNOSIS — Z0000 Encounter for general adult medical examination without abnormal findings: Secondary | ICD-10-CM | POA: Diagnosis not present

## 2023-09-24 LAB — LIPID PANEL

## 2023-09-25 ENCOUNTER — Ambulatory Visit
Admission: RE | Admit: 2023-09-25 | Discharge: 2023-09-25 | Disposition: A | Discharge status: Home / Self Care | Source: Ambulatory Visit | Attending: Nurse Practitioner | Admitting: Nurse Practitioner

## 2023-09-25 DIAGNOSIS — Z1231 Encounter for screening mammogram for malignant neoplasm of breast: Secondary | ICD-10-CM | POA: Diagnosis not present

## 2023-09-25 LAB — CMP14+EGFR
ALT: 15 IU/L (ref 0–32)
AST: 22 IU/L (ref 0–40)
Albumin: 4.5 g/dL (ref 3.9–4.9)
Alkaline Phosphatase: 64 IU/L (ref 44–121)
BUN/Creatinine Ratio: 14 (ref 12–28)
BUN: 10 mg/dL (ref 8–27)
Bilirubin Total: 0.5 mg/dL (ref 0.0–1.2)
CO2: 24 mmol/L (ref 20–29)
Calcium: 9.6 mg/dL (ref 8.7–10.3)
Chloride: 96 mmol/L (ref 96–106)
Creatinine, Ser: 0.69 mg/dL (ref 0.57–1.00)
Globulin, Total: 1.9 g/dL (ref 1.5–4.5)
Glucose: 84 mg/dL (ref 70–99)
Potassium: 3.8 mmol/L (ref 3.5–5.2)
Sodium: 135 mmol/L (ref 134–144)
Total Protein: 6.4 g/dL (ref 6.0–8.5)
eGFR: 99 mL/min/{1.73_m2} (ref >59)

## 2023-09-25 LAB — CBC WITH DIFFERENTIAL/PLATELET
Basophils Absolute: 0.1 10*3/uL (ref 0.0–0.2)
Basos: 1 %
EOS (ABSOLUTE): 0.2 10*3/uL (ref 0.0–0.4)
Eos: 4 %
Hematocrit: 38.5 % (ref 34.0–46.6)
Hemoglobin: 13.9 g/dL (ref 11.1–15.9)
Immature Grans (Abs): 0 10*3/uL (ref 0.0–0.1)
Immature Granulocytes: 0 %
Lymphocytes Absolute: 2.8 10*3/uL (ref 0.7–3.1)
Lymphs: 43 %
MCH: 32.4 pg (ref 26.6–33.0)
MCHC: 36.1 g/dL — ABNORMAL HIGH (ref 31.5–35.7)
MCV: 90 fL (ref 79–97)
Monocytes Absolute: 0.3 10*3/uL (ref 0.1–0.9)
Monocytes: 5 %
Neutrophils Absolute: 3 10*3/uL (ref 1.4–7.0)
Neutrophils: 47 %
Platelets: 305 10*3/uL (ref 150–450)
RBC: 4.29 x10E6/uL (ref 3.77–5.28)
RDW: 11.9 % (ref 11.7–15.4)
WBC: 6.4 10*3/uL (ref 3.4–10.8)

## 2023-09-25 LAB — VITAMIN D 25 HYDROXY (VIT D DEFICIENCY, FRACTURES): Vit D, 25-Hydroxy: 40.4 ng/mL (ref 30.0–100.0)

## 2023-09-25 LAB — LIPID PANEL
Cholesterol, Total: 228 mg/dL — ABNORMAL HIGH (ref 100–199)
HDL: 88 mg/dL (ref >39)
LDL CALC COMMENT:: 2.6 ratio (ref 0.0–4.4)
LDL Chol Calc (NIH): 128 mg/dL — ABNORMAL HIGH (ref 0–99)
Triglycerides: 68 mg/dL (ref 0–149)
VLDL Cholesterol Cal: 12 mg/dL (ref 5–40)

## 2023-09-25 LAB — TSH: TSH: 1.56 u[IU]/mL (ref 0.450–4.500)

## 2023-09-25 LAB — HEMOGLOBIN A1C
Est. average glucose Bld gHb Est-mCnc: 108 mg/dL
Hgb A1c MFr Bld: 5.4 % (ref 4.8–5.6)

## 2023-09-29 ENCOUNTER — Other Ambulatory Visit (HOSPITAL_COMMUNITY): Payer: Self-pay

## 2023-09-29 ENCOUNTER — Ambulatory Visit: Payer: 59 | Admitting: Nurse Practitioner

## 2023-09-29 ENCOUNTER — Encounter: Payer: Self-pay | Admitting: Nurse Practitioner

## 2023-09-29 VITALS — BP 128/84 | HR 58 | Ht 61.5 in | Wt 115.6 lb

## 2023-09-29 DIAGNOSIS — I10 Essential (primary) hypertension: Secondary | ICD-10-CM | POA: Diagnosis not present

## 2023-09-29 DIAGNOSIS — F419 Anxiety disorder, unspecified: Secondary | ICD-10-CM

## 2023-09-29 DIAGNOSIS — Z800 Family history of malignant neoplasm of digestive organs: Secondary | ICD-10-CM | POA: Insufficient documentation

## 2023-09-29 DIAGNOSIS — Z0000 Encounter for general adult medical examination without abnormal findings: Secondary | ICD-10-CM | POA: Diagnosis not present

## 2023-09-29 DIAGNOSIS — E039 Hypothyroidism, unspecified: Secondary | ICD-10-CM | POA: Diagnosis not present

## 2023-09-29 DIAGNOSIS — F5101 Primary insomnia: Secondary | ICD-10-CM | POA: Diagnosis not present

## 2023-09-29 DIAGNOSIS — K219 Gastro-esophageal reflux disease without esophagitis: Secondary | ICD-10-CM

## 2023-09-29 DIAGNOSIS — Z1211 Encounter for screening for malignant neoplasm of colon: Secondary | ICD-10-CM | POA: Diagnosis not present

## 2023-09-29 DIAGNOSIS — F339 Major depressive disorder, recurrent, unspecified: Secondary | ICD-10-CM | POA: Diagnosis not present

## 2023-09-29 MED ORDER — FLUOXETINE HCL 40 MG PO CAPS
40.0000 mg | ORAL_CAPSULE | Freq: Every day | ORAL | 3 refills | Status: AC
  Filled 2023-09-29 – 2023-11-07 (×4): qty 90, 90d supply, fill #0
  Filled 2024-01-08 – 2024-02-03 (×2): qty 90, 90d supply, fill #1
  Filled 2024-05-05: qty 90, 90d supply, fill #2

## 2023-09-29 MED ORDER — LOSARTAN POTASSIUM-HCTZ 50-12.5 MG PO TABS
0.5000 | ORAL_TABLET | Freq: Every day | ORAL | 3 refills | Status: AC
  Filled 2023-09-29: qty 45, 90d supply, fill #0
  Filled 2024-03-05: qty 45, 90d supply, fill #1
  Filled 2024-06-02: qty 45, 90d supply, fill #2

## 2023-09-29 MED ORDER — TRAZODONE HCL 100 MG PO TABS
100.0000 mg | ORAL_TABLET | Freq: Every evening | ORAL | 3 refills | Status: AC | PRN
  Filled 2023-09-29: qty 90, 90d supply, fill #0
  Filled 2024-01-08: qty 90, 90d supply, fill #1
  Filled 2024-05-05: qty 90, 90d supply, fill #2

## 2023-09-29 MED ORDER — OMEPRAZOLE 20 MG PO CPDR
20.0000 mg | DELAYED_RELEASE_CAPSULE | Freq: Two times a day (BID) | ORAL | 3 refills | Status: AC
  Filled 2023-09-29 – 2023-10-08 (×2): qty 180, 90d supply, fill #0
  Filled 2024-01-08: qty 180, 90d supply, fill #1
  Filled 2024-04-14: qty 180, 90d supply, fill #2
  Filled 2024-07-09: qty 180, 90d supply, fill #3

## 2023-09-29 MED ORDER — FLUOXETINE HCL 20 MG PO CAPS
20.0000 mg | ORAL_CAPSULE | Freq: Every day | ORAL | 3 refills | Status: AC
  Filled 2023-09-29 – 2023-11-07 (×4): qty 90, 90d supply, fill #0
  Filled 2024-01-08 – 2024-02-03 (×2): qty 90, 90d supply, fill #1
  Filled 2024-05-05: qty 90, 90d supply, fill #2

## 2023-09-29 MED ORDER — LEVOTHYROXINE SODIUM 25 MCG PO TABS
25.0000 ug | ORAL_TABLET | Freq: Every day | ORAL | 3 refills | Status: AC
  Filled 2023-09-29: qty 90, 90d supply, fill #0
  Filled 2024-01-01: qty 90, 90d supply, fill #1
  Filled 2024-03-29: qty 90, 90d supply, fill #2
  Filled 2024-06-23: qty 90, 90d supply, fill #3

## 2023-09-29 NOTE — Assessment & Plan Note (Signed)
 Refill on omeprazole . No concerns.

## 2023-09-29 NOTE — Assessment & Plan Note (Signed)
 Doing well. Recent high stressors with sister's new diagnosis of breast cancer with metastasis to lung and bone and responsibilities taking care of her. She is managing this very well. We discussed her letting me know if she feels that her mood is not doing well.

## 2023-09-29 NOTE — Assessment & Plan Note (Signed)
 Sleep aid with trazodone . No concerns reported. Refills for 1 year sent.

## 2023-09-29 NOTE — Patient Instructions (Addendum)
 Your labs look very good!! Your cholesterol looks good- keep working on low fat in your diet.  Keep taking a vitamin D  supplement.   I sent the referral for your colonoscopy. The referral coordinator will get you in with someone who accepts our insurance.  They will call you to schedule.   My prayers are with you and your family.   It was so great to see you today!!

## 2023-09-29 NOTE — Assessment & Plan Note (Signed)
 Well controlled with no alarm symptoms. Continue current regimen of losartan -hydrochlorothiazide . Refills sent for 1 year.

## 2023-09-29 NOTE — Assessment & Plan Note (Signed)
 Well controlled on current regimen with no alarms. Refills for 1 year sent

## 2023-09-29 NOTE — Progress Notes (Signed)
 Dell Fennel, DNP, AGNP-c [facility] [address] [place], [place] [address] Main Office [phone]  BP 128/84   Pulse (!) 58   Ht 5' 1.5" (1.562 m)   Wt 115 lb 9.6 oz (52.4 kg)   LMP 06/03/2012   BMI 21.49 kg/m    Subjective:    Patient ID: Sue [name], female    DOB: 06-04-1960, 63 y.o.   MRN: [id number]  HPI: Sue [name] is a 63 y.o. female presenting on 09/29/2023 for comprehensive medical examination.   History of Present Illness Sue [name] is a 63 year old female who presents for a routine follow-up visit.  She has been actively involved in her sister's care, who has stage four breast cancer with metastasis to the lungs and bones. Her sister has been frequently hospitalized, and she has been staying overnight at her sister's place since her discharge. Her sister's primary breast tumor shrank by one third in three weeks with estrogen treatment. However, after an infusion of Ibrance, her sister developed a fever, which has been managed. Her sister also experienced weakness and headaches but has felt better recently. A significant amount of fluid was removed from her sister's lungs due to pneumonia, improving her breathing.  Amidst these challenges, she has been managing her own health. She recently had a mammogram, which showed no findings suspicious for malignancy, and she is up to date on her mammograms. No changes in bowel habits or blood, and no issues with swallowing. She mentions her cholesterol levels and blood sugar, noting that her LDL is slightly high but her HDL is good.  She is currently taking fluoxetine  60 mg, losartan , and hydrochlorothiazide . Her prescriptions are due for renewal, and she has been receiving emails about them but hasn't had time to address them.  She describes a recent incident where she had to have one of her dogs put down due to health issues, which was emotionally challenging. 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  Pertinent items are noted in HPI.   Most Recent Depression Screen:     09/29/2023   11:39 AM 09/24/2022    3:09 PM 07/18/2021    1:30 PM 03/19/2021   10:22 AM 09/08/2020   11:11 AM  Depression screen PHQ 2/9  Decreased Interest 0 0  3 1  Down, Depressed, Hopeless 0 0  3 2  PHQ - 2 Score 0 0  6 3  Altered sleeping    2 3  Tired, decreased energy    3 2  Change in appetite    2 2  Feeling bad or failure about yourself     3 3  Trouble concentrating    2 3  Moving slowly or fidgety/restless    3 3  Suicidal thoughts    0 0  PHQ-9 Score    21 19  Difficult doing work/chores    Somewhat difficult      Information is confidential and restricted. Go to Review Flowsheets to unlock data.   Most Recent Anxiety Screen:     03/19/2021   10:23 AM 09/08/2020   11:12 AM 08/24/2020   12:39 PM 10/19/2019    8:55 AM  GAD 7 : Generalized Anxiety Score  Nervous, Anxious, on Edge 3 2 3  0  Control/stop worrying 3 3 3  0  Worry too much - different things 3 3 3  0  Trouble relaxing 3 3 3  0  Restless 3 3 3  0  Easily annoyed or irritable 3 3 3  0  Afraid - awful might happen 2 3 3  0  Total GAD 7 Score 20 20 21  0  Anxiety Difficulty Somewhat difficult Somewhat difficult Extremely difficult Not difficult at all   Most Recent Fall Screen:    09/29/2023   11:39 AM 09/24/2022    3:09 PM 03/19/2021   10:22 AM 09/08/2020   11:11 AM  Fall Risk   Falls in the past year? 0 0 0 1  Number falls in past yr: 0 0 0 0  Injury with Fall? 0 0 0 1  Risk for fall due to : No Fall Risks No Fall Risks No Fall Risks No Fall Risks  Follow up Falls evaluation completed Falls evaluation completed Falls evaluation completed     Past medical history, surgical history, medications, allergies, family history and social history reviewed with patient today and changes made to appropriate areas of the  chart.  Past Medical History:  Past Medical History:  Diagnosis Date   Abdominal bloating 08/24/2020   Abnormal weight loss 08/24/2020   Anxiety    Arthritis    Hands   Change in bowel habit 08/24/2020   Encounter for annual physical exam 08/24/2020   Encounter for screening mammogram for malignant neoplasm of breast 08/24/2020   Epigastric pain 08/24/2020   Fall 07/19/2021   GERD (gastroesophageal reflux disease)    Hypertension    Hypokalemia 09/08/2020   Hypothyroid    MDD (major depressive disorder), recurrent episode, moderate (HCC) 08/24/2020   Pigmented skin lesions 08/24/2020   Rectal bleeding 08/24/2020   Rib pain on left side 07/20/2021   Medications:  Current Outpatient Medications on File Prior to Visit  Medication Sig   clonazePAM  (KLONOPIN ) 1 MG tablet Take 1/2-1 tablet (0.5-1 mg total) by mouth daily as needed for anxiety.   Multiple Vitamin (MULTIVITAMIN WITH MINERALS) TABS tablet Take 1 tablet by mouth daily.   No current facility-administered medications on file prior to visit.   Surgical History:  Past Surgical History:  Procedure Laterality Date   INCISION AND DRAINAGE Right 09/03/2019   Procedure: INCISION AND DRAINAGE DOG BITE WOUND RIGHT HAND;  Surgeon: Lyanne Sample, MD;  Location: [facility] OR;  Service: Orthopedics;  Laterality: Right;  IV REGIONAL FOREARM BLOCK   TONSILLECTOMY  3rd grade   UPPER ESOPHAGEAL ENDOSCOPIC ULTRASOUND (EUS) N/A 02/25/2017   Procedure: UPPER ESOPHAGEAL ENDOSCOPIC ULTRASOUND (EUS);  Surgeon: Alvis Jourdain, MD;  Location: [facility];  Service: Endoscopy;  Laterality: N/A;   Allergies:  Allergies  Allergen Reactions   Nsaids Other (See Comments)    Other reaction(s): GI upset   Family History:  Family History  Problem Relation Age of Onset   Lupus Mother    Osteoporosis Mother    Hypertension Father    Heart attack Father    Breast cancer Sister 39   Uterine cancer Maternal Grandmother    Hypertension Brother    Colon  cancer Other        Paternal Great Grandmother   Breast cancer Other        Maternal Great Grandmother       Objective:    BP 128/84   Pulse (!) 58   Ht 5' 1.5" (1.562 m)   Wt 115 lb 9.6 oz (52.4 kg)   LMP 06/03/2012   BMI 21.49 kg/m   Wt Readings from Last 3 Encounters:  09/29/23 115  lb 9.6 oz (52.4 kg)  06/05/23 117 lb 9.6 oz (53.3 kg)  09/24/22 114 lb 12.8 oz (52.1 kg)    Physical Exam Vitals and nursing note reviewed.  Constitutional:      General: She is not in acute distress.    Appearance: Normal appearance.  HENT:     Head: Normocephalic and atraumatic.     Right Ear: Hearing, tympanic membrane, ear canal and external ear normal.     Left Ear: Hearing, tympanic membrane, ear canal and external ear normal.     Nose: Nose normal.     Right Sinus: No maxillary sinus tenderness or frontal sinus tenderness.     Left Sinus: No maxillary sinus tenderness or frontal sinus tenderness.     Mouth/Throat:     Lips: Pink.     Mouth: Mucous membranes are moist.     Pharynx: Oropharynx is clear.  Eyes:     General: Lids are normal. Vision grossly intact.     Extraocular Movements: Extraocular movements intact.     Conjunctiva/sclera: Conjunctivae normal.     Pupils: Pupils are equal, round, and reactive to light.     Funduscopic exam:    Right eye: Red reflex present.        Left eye: Red reflex present.    Visual Fields: Right eye visual fields normal and left eye visual fields normal.  Neck:     Thyroid : No thyromegaly.     Vascular: No carotid bruit.  Cardiovascular:     Rate and Rhythm: Normal rate and regular rhythm.     Chest Wall: PMI is not displaced.     Pulses: Normal pulses.          Dorsalis pedis pulses are 2+ on the right side and 2+ on the left side.       Posterior tibial pulses are 2+ on the right side and 2+ on the left side.     Heart sounds: Normal heart sounds. No murmur heard. Pulmonary:     Effort: Pulmonary effort is normal. No respiratory  distress.     Breath sounds: Normal breath sounds.  Abdominal:     General: Abdomen is flat. Bowel sounds are normal. There is no distension.     Palpations: Abdomen is soft. There is no hepatomegaly, splenomegaly or mass.     Tenderness: There is no abdominal tenderness. There is no right CVA tenderness, left CVA tenderness, guarding or rebound.  Musculoskeletal:        General: Normal range of motion.     Cervical back: Full passive range of motion without pain, normal range of motion and neck supple. No tenderness.     Right lower leg: No edema.     Left lower leg: No edema.  Feet:     Left foot:     Toenail Condition: Left toenails are normal.  Lymphadenopathy:     Cervical: No cervical adenopathy.     Upper Body:     Right upper body: No supraclavicular adenopathy.     Left upper body: No supraclavicular adenopathy.  Skin:    General: Skin is warm and dry.     Capillary Refill: Capillary refill takes less than 2 seconds.     Nails: There is no clubbing.  Neurological:     General: No focal deficit present.     Mental Status: She is alert and oriented to person, place, and time.     GCS: GCS eye subscore is 4. GCS  verbal subscore is 5. GCS motor subscore is 6.     Sensory: Sensation is intact.     Motor: Motor function is intact.     Coordination: Coordination is intact.     Gait: Gait is intact.     Deep Tendon Reflexes: Reflexes are normal and symmetric.  Psychiatric:        Attention and Perception: Attention normal.        Mood and Affect: Mood normal.        Speech: Speech normal.        Behavior: Behavior normal. Behavior is cooperative.        Thought Content: Thought content normal.        Cognition and Memory: Cognition and memory normal.        Judgment: Judgment normal.      Results for orders placed or performed in visit on 09/24/23  Vitamin D , 25-hydroxy   Collection Time: 09/24/23 10:44 AM  Result Value Ref Range   Vit D, 25-Hydroxy 40.4 30.0 - 100.0  ng/mL  TSH   Collection Time: 09/24/23 10:44 AM  Result Value Ref Range   TSH 1.560 0.450 - 4.500 uIU/mL  Lipid panel   Collection Time: 09/24/23 10:44 AM  Result Value Ref Range   Cholesterol, Total 228 (H) 100 - 199 mg/dL   Triglycerides 68 0 - 149 mg/dL   HDL 88 >09 mg/dL   VLDL Cholesterol Cal 12 5 - 40 mg/dL   LDL Chol Calc (NIH) 811 (H) 0 - 99 mg/dL   Chol/HDL Ratio 2.6 0.0 - 4.4 ratio  Hemoglobin A1c   Collection Time: 09/24/23 10:44 AM  Result Value Ref Range   Hgb A1c MFr Bld 5.4 4.8 - 5.6 %   Est. average glucose Bld gHb Est-mCnc 108 mg/dL  BJY78+GNFA   Collection Time: 09/24/23 10:44 AM  Result Value Ref Range   Glucose 84 70 - 99 mg/dL   BUN 10 8 - 27 mg/dL   Creatinine, Ser 2.13 0.57 - 1.00 mg/dL   eGFR 99 >08 MV/HQI/6.96   BUN/Creatinine Ratio 14 12 - 28   Sodium 135 134 - 144 mmol/L   Potassium 3.8 3.5 - 5.2 mmol/L   Chloride 96 96 - 106 mmol/L   CO2 24 20 - 29 mmol/L   Calcium 9.6 8.7 - 10.3 mg/dL   Total Protein 6.4 6.0 - 8.5 g/dL   Albumin 4.5 3.9 - 4.9 g/dL   Globulin, Total 1.9 1.5 - 4.5 g/dL   Bilirubin Total 0.5 0.0 - 1.2 mg/dL   Alkaline Phosphatase 64 44 - 121 IU/L   AST 22 0 - 40 IU/L   ALT 15 0 - 32 IU/L  CBC with Differential/Platelet   Collection Time: 09/24/23 10:44 AM  Result Value Ref Range   WBC 6.4 3.4 - 10.8 x10E3/uL   RBC 4.29 3.77 - 5.28 x10E6/uL   Hemoglobin 13.9 11.1 - 15.9 g/dL   Hematocrit 29.5 28.4 - 46.6 %   MCV 90 79 - 97 fL   MCH 32.4 26.6 - 33.0 pg   MCHC 36.1 (H) 31.5 - 35.7 g/dL   RDW 13.2 44.0 - 10.2 %   Platelets 305 150 - 450 x10E3/uL   Neutrophils 47 Not Estab. %   Lymphs 43 Not Estab. %   Monocytes 5 Not Estab. %   Eos 4 Not Estab. %   Basos 1 Not Estab. %   Neutrophils Absolute 3.0 1.4 - 7.0 x10E3/uL   Lymphocytes Absolute 2.8  0.7 - 3.1 x10E3/uL   Monocytes Absolute 0.3 0.1 - 0.9 x10E3/uL   EOS (ABSOLUTE) 0.2 0.0 - 0.4 x10E3/uL   Basophils Absolute 0.1 0.0 - 0.2 x10E3/uL   Immature Granulocytes 0 Not  Estab. %   Immature Grans (Abs) 0.0 0.0 - 0.1 x10E3/uL       Assessment & Plan:   Problem List Items Addressed This Visit     Hypothyroid   Well controlled on her current regimen. Recent labs great. I will send refills on her medication for a year.       Relevant Medications   levothyroxine  (SYNTHROID ) 25 MCG tablet   Hypertension   Well controlled with no alarm symptoms. Continue current regimen of losartan -hydrochlorothiazide . Refills sent for 1 year.       Relevant Medications   losartan -hydrochlorothiazide  (HYZAAR ) 50-12.5 MG tablet   Anxiety   Doing well. Recent high stressors with sister's new diagnosis of breast cancer with metastasis to lung and bone and responsibilities taking care of her. She is managing this very well. We discussed her letting me know if she feels that her mood is not doing well.       Relevant Medications   FLUoxetine  (PROZAC ) 40 MG capsule   FLUoxetine  (PROZAC ) 20 MG capsule   traZODone  (DESYREL ) 100 MG tablet   Gastro-esophageal reflux disease without esophagitis   Refill on omeprazole . No concerns.       Relevant Medications   omeprazole  (PRILOSEC) 20 MG capsule   Insomnia   Sleep aid with trazodone . No concerns reported. Refills for 1 year sent.       Relevant Medications   traZODone  (DESYREL ) 100 MG tablet   Encounter for annual physical exam   CPE completed today. Review of HM activities and recommendations discussed and provided on AVS. Anticipatory guidance, diet, and exercise recommendations provided. Medications, allergies, and hx reviewed and updated as necessary. Orders placed as listed below.  Plan: - Labs ordered. Will make changes as necessary based on results.  - I will review these results and send recommendations via MyChart or a telephone call.  - F/U with CPE in 1 year or sooner for acute/chronic health needs as directed.        Depression, recurrent (HCC)   Well controlled on current regimen with no alarms. Refills  for 1 year sent      Relevant Medications   FLUoxetine  (PROZAC ) 40 MG capsule   FLUoxetine  (PROZAC ) 20 MG capsule   traZODone  (DESYREL ) 100 MG tablet   Other Visit Diagnoses       Screening for colon cancer    -  Primary   Relevant Orders   Ambulatory referral to Gastroenterology         Follow up plan: Return in about 1 year (around 09/28/2024) for CPE.  NEXT PREVENTATIVE PHYSICAL DUE IN 1 YEAR.  PATIENT COUNSELING PROVIDED FOR ALL ADULT PATIENTS: A well balanced diet low in saturated fats, cholesterol, and moderation in carbohydrates.  This can be as simple as monitoring portion sizes and cutting back on sugary beverages such as soda and juice to start with.    Daily water consumption of at least 64 ounces.  Physical activity at least 180 minutes per week.  If just starting out, start 10 minutes a day and work your way up.   This can be as simple as taking the stairs instead of the elevator and walking 2-3 laps around the office  purposefully every day.   STD protection, partner selection, and  regular testing if high risk.  Limited consumption of alcoholic beverages if alcohol is consumed. For men, I recommend no more than 14 alcoholic beverages per week, spread out throughout the week (max 2 per day). Avoid "binge" drinking or consuming large quantities of alcohol in one setting.  Please let me know if you feel you may need help with reduction or quitting alcohol consumption.   Avoidance of nicotine, if used. Please let me know if you feel you may need help with reduction or quitting nicotine use.   Daily mental health attention. This can be in the form of 5 minute daily meditation, prayer, journaling, yoga, reflection, etc.  Purposeful attention to your emotions and mental state can significantly improve your overall wellbeing  and  Health.  Please know that I am here to help you with all of your health care goals and am happy to work with you to find a solution that  works best for you.  The greatest advice I have received with any changes in life are to take it one step at a time, that even means if all you can focus on is the next 60 seconds, then do that and celebrate your victories.  With any changes in life, you will have set backs, and that is OK. The important thing to remember is, if you have a set back, it is not a failure, it is an opportunity to try again! Screening Testing Mammogram Every 1 -2 years based on history and risk factors Starting at age 21 Pap Smear Ages 21-39 every 3 years Ages 2-65 every 5 years with HPV testing More frequent testing may be required based on results and history Colon Cancer Screening Every 1-10 years based on test performed, risk factors, and history Starting at age 22 Bone Density Screening Every 2-10 years based on history Starting at age 64 for women Recommendations for men differ based on medication usage, history, and risk factors AAA Screening One time ultrasound Men 40-54 years old who have every smoked Lung Cancer Screening Low Dose Lung CT every 12 months Age 71-80 years with a 30 pack-year smoking history who still smoke or who have quit within the last 15 years   Screening Labs Routine  Labs: Complete Blood Count (CBC), Complete Metabolic Panel (CMP), Cholesterol (Lipid Panel) Every 6-12 months based on history and medications May be recommended more frequently based on current conditions or previous results Hemoglobin A1c Lab Every 3-12 months based on history and previous results Starting at age 72 or earlier with diagnosis of diabetes, high cholesterol, BMI >26, and/or risk factors Frequent monitoring for patients with diabetes to ensure blood sugar control Thyroid  Panel (TSH) Every 6 months based on history, symptoms, and risk factors May be repeated more often if on medication HIV One time testing for all patients 38 and older May be repeated more frequently for patients with  increased risk factors or exposure Hepatitis C One time testing for all patients 24 and older May be repeated more frequently for patients with increased risk factors or exposure Gonorrhea, Chlamydia Every 12 months for all sexually active persons 13-24 years Additional monitoring may be recommended for those who are considered high risk or who have symptoms Every 12 months for any woman on birth control, regardless of sexual activity PSA Men 3-3 years old with risk factors Additional screening may be recommended from age 66-69 based on risk factors, symptoms, and history  Vaccine Recommendations Tetanus Booster All adults every 10 years Flu  Vaccine All patients 6 months and older every year COVID Vaccine All patients 12 years and older Initial dosing with booster May recommend additional booster based on age and health history HPV Vaccine 2 doses all patients age 16-26 Dosing may be considered for patients over 26 Shingles Vaccine (Shingrix) 2 doses all adults 55 years and older Pneumonia (Pneumovax 23) All adults 65 years and older May recommend earlier dosing based on health history One year apart from Prevnar 13 Pneumonia (Prevnar 43) All adults 65 years and older Dosed 1 year after Pneumovax 23 Pneumonia (Prevnar 20) One time alternative to the two dosing of 13 and 23 For all adults with initial dose of 23, 20 is recommended 1 year later For all adults with initial dose of 13, 23 is still recommended as second option 1 year later

## 2023-09-29 NOTE — Assessment & Plan Note (Signed)
 Well controlled on her current regimen. Recent labs great. I will send refills on her medication for a year.

## 2023-09-29 NOTE — Assessment & Plan Note (Signed)

## 2023-10-08 ENCOUNTER — Other Ambulatory Visit (HOSPITAL_COMMUNITY): Payer: Self-pay

## 2023-10-08 ENCOUNTER — Other Ambulatory Visit: Payer: Self-pay

## 2023-10-09 ENCOUNTER — Other Ambulatory Visit (HOSPITAL_COMMUNITY): Payer: Self-pay

## 2023-11-07 ENCOUNTER — Other Ambulatory Visit (HOSPITAL_COMMUNITY): Payer: Self-pay

## 2024-01-01 ENCOUNTER — Other Ambulatory Visit: Payer: Self-pay

## 2024-01-08 ENCOUNTER — Other Ambulatory Visit: Payer: Self-pay

## 2024-03-23 ENCOUNTER — Ambulatory Visit: Admitting: Family Medicine

## 2024-03-23 ENCOUNTER — Other Ambulatory Visit (HOSPITAL_COMMUNITY): Payer: Self-pay

## 2024-03-23 ENCOUNTER — Encounter: Payer: Self-pay | Admitting: Family Medicine

## 2024-03-23 ENCOUNTER — Ambulatory Visit: Payer: Self-pay

## 2024-03-23 VITALS — BP 124/76 | HR 73 | Wt 120.0 lb

## 2024-03-23 DIAGNOSIS — M79671 Pain in right foot: Secondary | ICD-10-CM | POA: Diagnosis not present

## 2024-03-23 MED ORDER — MELOXICAM 15 MG PO TABS
15.0000 mg | ORAL_TABLET | Freq: Every day | ORAL | 0 refills | Status: AC
  Filled 2024-03-23: qty 30, 30d supply, fill #0

## 2024-03-23 NOTE — Telephone Encounter (Signed)
 We have openings today if patient doesn't want to wait until tomorrow

## 2024-03-23 NOTE — Progress Notes (Signed)
   Name: Sue [name]   Date of Visit: 03/23/24   Date of last visit with me: Visit date not found   CHIEF COMPLAINT:  Chief Complaint  Patient presents with   Acute Visit    Top of foot injury.        HPI:  Discussed the use of AI scribe software for clinical note transcription with the patient, who gave verbal consent to proceed.  History of Present Illness   Sue [name] is a 63 year old female who presents with recurrent ankle sprain.  She experienced a recurrent ankle sprain on Sunday night at approximately 5:30 PM. The incident occurred when she got up from her computer, took a step, and her foot turned while wearing socks and Nike flip flops, causing her to fall. This is the third occurrence of such an injury, with the first happening five years ago and another episode either last summer or the summer before.  The pain is localized on the side of her foot, specifically on the top and side, but not on the ankle itself. She has been using a compression sleeve to manage swelling and has been wearing a post-op shoe for support. She has not engaged in any ankle rehabilitation exercises since the original injury, although she walks her dogs and power walks with a neighbor for 45 minutes every morning.  She manages the pain with ibuprofen , taking six at a time, although she notes that it 'kind of kills my stomach'. She has been icing the injury immediately after the incident, as advised by her mother, who was a surgical nurse. She has difficulty walking and performing daily activities due to the pain, stating that she is unable to walk without the air boot, which she finds helpful for support and mobility.         OBJECTIVE:       09/29/2023   11:39 AM  Depression screen PHQ 2/9  Decreased Interest 0  Down, Depressed, Hopeless 0  PHQ - 2 Score 0     BP Readings from Last 3 Encounters:  03/23/24 124/76  09/29/23 128/84  06/05/23 126/82    BP 124/76   Pulse 73   Wt  120 lb (54.4 kg)   LMP 06/03/2012   SpO2 97%   BMI 22.31 kg/m    Physical Exam    Inspection reveals some bruising over the lateral midfoot area.  There is some tenderness to palpation of this area.  Tenderness to palpation over the ATFL over the AITFL, range of motion is full with plantarflexion and dorsiflexion.  No pain with inversion and mild pain with eversion. 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 Possible degenerative joint disease. No acute fracture suspected. Ligamentous laxity noted. - Order right foot x-ray at [facility] to assess degenerative changes and rule out acute fracture. - Prescribe meloxicam  for pain management. - Advise wearing post-op shoe with compression sleeve. - Recommend regular icing. - Suggest ASO brace for additional support if needed. - Follow up in three weeks to assess improvement and discuss further management.         Eileen Croswell A. Vita MD [facility]

## 2024-03-23 NOTE — Telephone Encounter (Signed)
 Left message for pt to call, we have openings today, not with Camie but other doctors and we have Dr Vita who specializes in sports medicine

## 2024-03-23 NOTE — Telephone Encounter (Signed)
 FYI Only or Action Required?: Action required by provider: request for appointment.  Patient was last seen in primary care on 09/29/2023 by Early, Camie BRAVO, NP.  Called Nurse Triage reporting No chief complaint on file..  Symptoms began several days ago.  Interventions attempted: Nothing.  Symptoms are: gradually worsening.  Triage Disposition: See Physician Within 24 Hours  Patient/caregiver understands and will follow disposition?: Yes  Copied from CRM #[id number]. Topic: Clinical - Red Word Triage >> Mar 23, 2024  8:59 AM [place] MATSU wrote: Red Word that prompted transfer to Nurse Triage:  turned ankle heard snapped. Painful to walk   Right foot Reason for Disposition  A snap or pop was heard at the time of injury  Answer Assessment - Initial Assessment Questions 1. MECHANISM: How did the injury happen? (e.g., twisting injury, direct blow)      Twisted my foot, slipped out of the sandal  2. ONSET: When did the injury happen? (e.g., minutes or hours ago)      Sunday  3. LOCATION: Where is the injury located?      Right   4. APPEARANCE of INJURY: What does the injury look like?      Swelling  5. WEIGHT-BEARING: Can you put weight on that foot? Can you walk (four steps or more)?       Painful with weight bearing  6. SIZE: For cuts, bruises, or swelling, ask: How large is it? (e.g., inches or centimeters; entire joint)      Swelling, Mild to Moderate  7. PAIN: Is there pain? If Yes, ask: How bad is the pain?  What does it keep you from doing? (Scale 0-10; or none, mild, moderate, severe)    Yes, Mild to moderate  8. TETANUS: For any breaks in the skin, ask: When was your last tetanus booster?     Unsure  9. OTHER SYMPTOMS: Do you have any other symptoms?      No  10. PREGNANCY: Is there any chance you are pregnant? When was your last menstrual period?       No and No  Protocols used: Ankle Injury-A-AH

## 2024-03-24 ENCOUNTER — Ambulatory Visit: Admitting: Family Medicine

## 2024-03-25 ENCOUNTER — Ambulatory Visit
Admission: RE | Admit: 2024-03-25 | Discharge: 2024-03-25 | Disposition: A | Source: Ambulatory Visit | Attending: Family Medicine

## 2024-03-25 ENCOUNTER — Other Ambulatory Visit: Payer: Self-pay | Admitting: Family Medicine

## 2024-03-25 DIAGNOSIS — M7731 Calcaneal spur, right foot: Secondary | ICD-10-CM | POA: Diagnosis not present

## 2024-03-25 DIAGNOSIS — M25571 Pain in right ankle and joints of right foot: Secondary | ICD-10-CM | POA: Diagnosis not present

## 2024-03-25 DIAGNOSIS — M79671 Pain in right foot: Secondary | ICD-10-CM

## 2024-03-29 ENCOUNTER — Other Ambulatory Visit (HOSPITAL_COMMUNITY): Payer: Self-pay

## 2024-03-29 ENCOUNTER — Other Ambulatory Visit: Payer: Self-pay

## 2024-03-29 ENCOUNTER — Other Ambulatory Visit: Payer: Self-pay | Admitting: Nurse Practitioner

## 2024-03-29 DIAGNOSIS — F339 Major depressive disorder, recurrent, unspecified: Secondary | ICD-10-CM

## 2024-03-29 DIAGNOSIS — F419 Anxiety disorder, unspecified: Secondary | ICD-10-CM

## 2024-03-29 MED ORDER — CLONAZEPAM 1 MG PO TABS
0.5000 mg | ORAL_TABLET | Freq: Every day | ORAL | 2 refills | Status: AC | PRN
  Filled 2024-03-29: qty 30, 30d supply, fill #0
  Filled 2024-06-23: qty 30, 30d supply, fill #1

## 2024-03-29 NOTE — Telephone Encounter (Signed)
 Last apt 09/29/23

## 2024-06-23 ENCOUNTER — Other Ambulatory Visit: Payer: Self-pay

## 2024-06-23 ENCOUNTER — Other Ambulatory Visit (HOSPITAL_COMMUNITY): Payer: Self-pay

## 2024-06-24 ENCOUNTER — Other Ambulatory Visit (HOSPITAL_COMMUNITY): Payer: Self-pay

## 2024-06-25 ENCOUNTER — Other Ambulatory Visit (HOSPITAL_COMMUNITY): Payer: Self-pay
